# Patient Record
Sex: Female | Born: 1940 | Race: White | Hispanic: No | Marital: Married
Health system: Southern US, Community
[De-identification: ages and names within clinical notes are randomized; demographics above are authoritative.]

## PROBLEM LIST (undated history)

## (undated) DIAGNOSIS — J84112 Idiopathic pulmonary fibrosis: Secondary | ICD-10-CM

## (undated) DIAGNOSIS — I272 Pulmonary hypertension, unspecified: Secondary | ICD-10-CM

## (undated) DIAGNOSIS — J9691 Respiratory failure, unspecified with hypoxia: Secondary | ICD-10-CM

## (undated) DIAGNOSIS — IMO0002 Reserved for concepts with insufficient information to code with codable children: Secondary | ICD-10-CM

## (undated) DIAGNOSIS — J9611 Chronic respiratory failure with hypoxia: Secondary | ICD-10-CM

## (undated) DIAGNOSIS — J849 Interstitial pulmonary disease, unspecified: Secondary | ICD-10-CM

## (undated) HISTORY — DX: Interstitial pulmonary disease, unspecified: J84.9

## (undated) HISTORY — PX: TUBAL LIGATION: SHX77

## (undated) HISTORY — PX: APPENDECTOMY: SHX54

---

## 2007-03-27 ENCOUNTER — Emergency Department (HOSPITAL_COMMUNITY): Admission: EM | Admit: 2007-03-27 | Discharge: 2007-03-27 | Payer: Self-pay | Admitting: Emergency Medicine

## 2007-07-07 ENCOUNTER — Emergency Department (HOSPITAL_COMMUNITY): Admission: EM | Admit: 2007-07-07 | Discharge: 2007-07-07 | Payer: Self-pay | Admitting: Family Medicine

## 2012-11-04 ENCOUNTER — Ambulatory Visit
Admission: RE | Admit: 2012-11-04 | Discharge: 2012-11-04 | Disposition: A | Discharge status: Home / Self Care | Payer: Medicare Other | Source: Ambulatory Visit | Attending: Family | Admitting: Family

## 2012-11-04 ENCOUNTER — Other Ambulatory Visit: Payer: Self-pay | Admitting: Family

## 2012-11-04 DIAGNOSIS — R0989 Other specified symptoms and signs involving the circulatory and respiratory systems: Secondary | ICD-10-CM

## 2012-11-09 ENCOUNTER — Institutional Professional Consult (permissible substitution): Payer: Medicare Other | Admitting: Internal Medicine

## 2012-11-14 ENCOUNTER — Other Ambulatory Visit (INDEPENDENT_AMBULATORY_CARE_PROVIDER_SITE_OTHER): Payer: Medicare Other

## 2012-11-14 ENCOUNTER — Ambulatory Visit (INDEPENDENT_AMBULATORY_CARE_PROVIDER_SITE_OTHER): Payer: Medicare Other | Admitting: Internal Medicine

## 2012-11-14 ENCOUNTER — Encounter (INDEPENDENT_AMBULATORY_CARE_PROVIDER_SITE_OTHER): Payer: Self-pay

## 2012-11-14 ENCOUNTER — Encounter: Payer: Self-pay | Admitting: Internal Medicine

## 2012-11-14 VITALS — BP 132/84 | HR 83 | Temp 98.6°F | Ht 61.0 in | Wt 247.8 lb

## 2012-11-14 DIAGNOSIS — J8410 Pulmonary fibrosis, unspecified: Secondary | ICD-10-CM

## 2012-11-14 LAB — CBC WITH DIFFERENTIAL/PLATELET
Basophils Absolute: 0 10*3/uL (ref 0.0–0.1)
Eosinophils Absolute: 0.6 10*3/uL (ref 0.0–0.7)
HCT: 45.3 % (ref 36.0–46.0)
Hemoglobin: 15.5 g/dL — ABNORMAL HIGH (ref 12.0–15.0)
Lymphs Abs: 2.6 10*3/uL (ref 0.7–4.0)
MCHC: 34.2 g/dL (ref 30.0–36.0)
MCV: 91.6 fl (ref 78.0–100.0)
Monocytes Absolute: 0.6 10*3/uL (ref 0.1–1.0)
Neutro Abs: 5.1 10*3/uL (ref 1.4–7.7)
Platelets: 231 10*3/uL (ref 150.0–400.0)
RDW: 14 % (ref 11.5–14.6)

## 2012-11-14 LAB — BASIC METABOLIC PANEL
BUN: 13 mg/dL (ref 6–23)
Calcium: 9.4 mg/dL (ref 8.4–10.5)
Chloride: 100 mEq/L (ref 96–112)
Creatinine, Ser: 0.7 mg/dL (ref 0.4–1.2)
GFR: 90.87 mL/min (ref >60.00)

## 2012-11-14 MED ORDER — FAMOTIDINE 20 MG PO TABS: ORAL_TABLET | ORAL | Status: DC

## 2012-11-14 MED ORDER — PANTOPRAZOLE SODIUM 40 MG PO TBEC: 40.0000 mg | DELAYED_RELEASE_TABLET | Freq: Every day | ORAL | Status: DC

## 2012-11-14 MED ORDER — AZITHROMYCIN 250 MG PO TABS: ORAL_TABLET | ORAL | Status: DC

## 2012-11-14 NOTE — Progress Notes (Signed)
  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 ILD   11/14/2012 1st [facility] office visit/ Koby Pickup cc summer 2013 persistent cough and sob with exertion > adls.  Cough is dry and mostly daytime and assoc with constant sensation of pnds.  No obvious day to day or daytime variabilty or assoc  cp or chest tightness, subjective wheeze overt   hb symptoms. No unusual exp hx or h/o childhood pna/ asthma or knowledge of premature birth.  Sleeping ok without nocturnal  or early am exacerbation  of respiratory  c/o's or need for noct saba. Also denies any obvious fluctuation of symptoms with weather or environmental changes or other aggravating or alleviating factors except as outlined above   Current Medications, Allergies, Complete Past Medical History, Past Surgical History, Family History, and Social History were reviewed in [employer] record.     Review of Systems  Constitutional: Negative for fever and unexpected weight change.  HENT: Positive for postnasal drip and sneezing. Negative for congestion, dental problem, ear pain, nosebleeds, rhinorrhea, sinus pressure, sore throat and trouble swallowing.   Eyes: Negative for redness and itching.  Respiratory: Positive for cough and shortness of breath. Negative for chest tightness and wheezing.   Cardiovascular: Negative for palpitations and leg swelling.  Gastrointestinal: Negative for nausea and vomiting.  Genitourinary: Negative for dysuria.  Musculoskeletal: Negative for joint swelling.  Skin: Negative for rash.  Neurological: Negative for headaches.  Hematological: Does not bruise/bleed easily.  Psychiatric/Behavioral:  Negative for dysphoric mood. The patient is not nervous/anxious.        Objective:   Physical Exam   Hoarse amb obese wf nad  Wt Readings from Last 3 Encounters:  11/14/12 247 lb 12.8 oz (112.401 kg)     HEENT: nl dentition, turbinates, and orophanx. Nl external ear canals without cough reflex   NECK :  without JVD/Nodes/TM/ nl carotid upstrokes bilaterally   LUNGS: no acc muscle use, coarse bibasilar insp crackles   CV:  RRR  no s3 or murmur or increase in P2, no edema   ABD:  soft and nontender with nl excursion in the supine position. No bruits or organomegaly, bowel sounds nl  MS:  warm without deformities, calf tenderness, cyanosis - moderate clubbing  SKIN: warm and dry without lesions    NEURO:  alert, approp, no deficits    cxr 11/04/12  Suspect severe underlying chronic interstitial lung disease. There  are patchy bilateral airspace opacities. Cannot exclude superimposed  infection.   ESR 11/14/12 = 39 Eos 6.3%    Assessment & Plan:

## 2012-11-14 NOTE — Patient Instructions (Signed)
Pantoprazole (protonix) 40 mg   Take 30-60 min before first meal of the day and Pepcid 20 mg one bedtime until return to office - this is the best way to tell whether stomach acid is contributing to your problem.     GERD (REFLUX)  is an extremely common cause of respiratory symptoms, many times with no significant heartburn at all.    It can be treated with medication, but also with lifestyle changes including avoidance of late meals, excessive alcohol, smoking cessation, and avoid fatty foods, chocolate, peppermint, colas, red wine, and acidic juices such as orange juice.  NO MINT OR MENTHOL PRODUCTS SO NO COUGH DROPS  USE SUGARLESS CANDY INSTEAD (jolley ranchers or Stover's)  NO OIL BASED VITAMINS - use powdered substitutes.  Please see patient coordinator before you leave today  to schedule High resolution chest CT   Please remember to go to the lab and x-ray department downstairs for your tests - we will call you with the results when they are available.    Please schedule a follow up office visit in 4 weeks, call sooner if needed with pfts

## 2012-11-15 NOTE — Progress Notes (Signed)
Quick Note:  Spoke with pt and notified of results per Dr. Wert. Pt verbalized understanding and denied any questions.  ______ 

## 2012-11-15 NOTE — Assessment & Plan Note (Signed)
DDx for pulmonary fibrosis  includes idiopathic pulmonary fibrosis, pulmonary fibrosis associated with rheumatologic diseases (which have a relatively benign course in most cases) , adverse effect from  drugs such as chemotherapy or amiodarone exposure, nonspecific interstitial pneumonia which is typically steroid responsive, and chronic hypersensitivity pneumonitis.   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  Dec 2011  This may not be cause and effect, but given how universally unhelpful all the otherstudy drugs have been for pf,   rec start  rx ppi / diet/ lifestyle modification.    See instructions for specific recommendations which were reviewed directly with the patient who was given a copy with highlighter outlining the key components.

## 2012-11-17 ENCOUNTER — Other Ambulatory Visit: Payer: Self-pay

## 2012-11-17 ENCOUNTER — Ambulatory Visit (INDEPENDENT_AMBULATORY_CARE_PROVIDER_SITE_OTHER)
Admission: RE | Admit: 2012-11-17 | Discharge: 2012-11-17 | Disposition: A | Discharge status: Home / Self Care | Payer: Medicare Other | Source: Ambulatory Visit | Attending: Internal Medicine | Admitting: Internal Medicine

## 2012-11-17 DIAGNOSIS — J8410 Pulmonary fibrosis, unspecified: Secondary | ICD-10-CM

## 2012-11-18 ENCOUNTER — Encounter: Payer: Self-pay | Admitting: Internal Medicine

## 2012-11-18 NOTE — Progress Notes (Signed)
Quick Note:  Spoke with pt and notified of results per Dr. Wert. Pt verbalized understanding and denied any questions.  ______ 

## 2012-12-13 ENCOUNTER — Ambulatory Visit (INDEPENDENT_AMBULATORY_CARE_PROVIDER_SITE_OTHER): Payer: Medicare Other | Admitting: Internal Medicine

## 2012-12-13 ENCOUNTER — Encounter: Payer: Self-pay | Admitting: Internal Medicine

## 2012-12-13 VITALS — BP 124/76 | HR 72 | Temp 98.2°F | Ht 62.0 in | Wt 246.0 lb

## 2012-12-13 DIAGNOSIS — J8410 Pulmonary fibrosis, unspecified: Secondary | ICD-10-CM

## 2012-12-13 LAB — PULMONARY FUNCTION TEST

## 2012-12-13 NOTE — Assessment & Plan Note (Addendum)
-   onset of symptoms Summer 2013  - Labs 02/15/12 ESR 39, Eso 6.3% - HRCT 11/17/12 1. 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 Chronic HSP is not excluded. Pattern is notconsistent with usual interstitial pneumonitis (UIP). 2. Enlarged pulmonary arteries, indicative of PAH - PFT's 12/13/2012 VC 1.16 (42%) and no obst and DLCO 42% corrects to  107  - 12/13/2012  Walked RA  2 laps @ 185 ft each stopped due to desat 80% s sob at very rapid pace   Unable to identify any ongoing pulmonary insults/ exposures and given the fact the pt is not sob at all despite desat it is not possible to time the onset of her illness (the cough resolved with rx for GERD).  Explained need to get more points on the curve before considering any more aggressive w/u or rx except to continue rx for gerd.    Each maintenance medication was reviewed in detail including most importantly the difference between maintenance and as needed and under what circumstances the prns are to be used.  Please see instructions for details which were reviewed in writing and the patient given a copy.

## 2012-12-13 NOTE — Progress Notes (Signed)
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 ILD    History of Present Illness  11/14/2012 1st [facility] office visit/ Miranda Hines cc ? 2012 p cold more doe then   Acute onset cough summer 2013 p exposure to cleaning agents  persistent cough and sob with exertion > adls.  Cough is dry and mostly daytime and assoc with constant sensation of pnds. rec Pantoprazole (protonix) 40 mg   Take 30-60 min before first meal of the day and Pepcid 20 mg one bedtime until return to office - this is the best way to tell whether stomach acid is contributing to your problem.   GERD diet      12/13/2012 f/u ov/Miranda Hines re: PF/ cough > sob Chief Complaint  Patient presents with  . Followup with PFT    Pt states her cough and SOB are much improved since the last visit. No new co's today.   not limited by doe with shopping and housework, cough resolved to her satisfaction   No obvious day to day or daytime variabilty or assoc chronic cough or cp or chest tightness, subjective wheeze overt sinus or hb symptoms. No unusual exp hx or h/o childhood pna/ asthma or knowledge of premature birth.  Sleeping ok without nocturnal  or early am exacerbation  of respiratory  c/o's or need for noct saba. Also denies any obvious fluctuation of symptoms with weather or environmental changes or other aggravating or alleviating factors except as outlined above   Current Medications, Allergies, Complete Past Medical History, Past Surgical History, Family History, and Social History were reviewed in [employer] record.  ROS  The following are not active complaints unless bolded sore throat,  dysphagia, dental problems, itching, sneezing,  nasal congestion or excess/ purulent secretions, ear ache,   fever, chills, sweats, unintended wt loss, pleuritic or exertional cp, hemoptysis,  orthopnea pnd or leg swelling, presyncope, palpitations, heartburn, abdominal pain, anorexia, nausea, vomiting, diarrhea  or change in bowel or urinary habits, change in stools or urine, dysuria,hematuria,  rash, arthralgias, visual complaints, headache, numbness weakness or ataxia or problems with walking or coordination,  change in mood/affect or memory.                Objective:   Physical Exam   Hoarse amb obese wf nad  Wt Readings from Last 3 Encounters:  12/13/12 246 lb (111.585 kg)  11/14/12 247 lb 12.8 oz (112.401 kg)        HEENT: nl dentition, turbinates, and orophanx. Nl external ear canals without cough reflex   NECK :  without JVD/Nodes/TM/ nl carotid upstrokes bilaterally   LUNGS: no acc muscle use, coarse bibasilar insp crackles   CV:  RRR  no s3 or murmur or increase in P2, no edema   ABD:  soft and nontender with nl excursion in the supine position. No bruits or organomegaly, bowel sounds nl  MS:  warm without deformities, calf tenderness, cyanosis - mild/moderate clubbing  SKIN: warm and dry without lesions         cxr 11/04/12  Suspect severe underlying chronic interstitial lung disease. There  are patchy  bilateral airspace opacities. Cannot exclude superimposed  infection.   ESR 11/14/12 = 39 Eos 6.3%    Assessment & Plan:

## 2012-12-13 NOTE — Progress Notes (Signed)
PFT done today. 

## 2012-12-13 NOTE — Patient Instructions (Addendum)
Continue the acid suppression and diet as you are  Please schedule a follow up visit in 3 months but call sooner if needed with cxr on return

## 2012-12-14 ENCOUNTER — Encounter: Payer: Self-pay | Admitting: *Deleted

## 2012-12-23 ENCOUNTER — Telehealth: Payer: Self-pay | Admitting: Internal Medicine

## 2012-12-23 MED ORDER — AZITHROMYCIN 250 MG PO TABS: ORAL_TABLET | ORAL | Status: DC

## 2012-12-23 NOTE — Telephone Encounter (Signed)
Pt could not come for ov.  Zpak rx sent.  Pt instructed to get Chlortrimeton to replace Claritin and zyrtec.  Ov next week if no better.

## 2012-12-23 NOTE — Telephone Encounter (Signed)
1) prefer ov if possible  2) if not ok to rx zpak but change clariton and zyrtec to chlortrimeton 4 mg every 4 hours if needed (can cause drowsiness but more effective)    3) if not better ov early next week with me or Tammy NP

## 2012-12-23 NOTE — Telephone Encounter (Signed)
Pt c/o sinus drainage (clear), runny eyes, PND, nasal congestion, sneezing for past 2 days.  Denies cough or SOB.  Taking Claritin in am, Zyrtec in Pm and Flonase.  Pt is requesting another course of Zpak.  Please advise

## 2012-12-26 ENCOUNTER — Encounter: Payer: Self-pay | Admitting: Internal Medicine

## 2013-01-10 ENCOUNTER — Telehealth: Payer: Self-pay | Admitting: Internal Medicine

## 2013-01-10 ENCOUNTER — Ambulatory Visit: Payer: Medicare Other | Admitting: Internal Medicine

## 2013-01-10 NOTE — Telephone Encounter (Signed)
Spoke with pt. She scheduled an appt to come in tomorrow at 10:15 to see MW. Nothing further needed

## 2013-01-11 ENCOUNTER — Ambulatory Visit (INDEPENDENT_AMBULATORY_CARE_PROVIDER_SITE_OTHER): Payer: Medicare Other | Admitting: Internal Medicine

## 2013-01-11 ENCOUNTER — Encounter: Payer: Self-pay | Admitting: Internal Medicine

## 2013-01-11 VITALS — BP 110/70 | HR 76 | Temp 98.4°F | Ht 62.0 in | Wt 238.4 lb

## 2013-01-11 DIAGNOSIS — J069 Acute upper respiratory infection, unspecified: Secondary | ICD-10-CM | POA: Insufficient documentation

## 2013-01-11 DIAGNOSIS — J8410 Pulmonary fibrosis, unspecified: Secondary | ICD-10-CM

## 2013-01-11 MED ORDER — AMOXICILLIN-POT CLAVULANATE 875-125 MG PO TABS: 1.0000 | ORAL_TABLET | Freq: Two times a day (BID) | ORAL | Status: DC

## 2013-01-11 MED ORDER — PREDNISONE 10 MG PO TABS: ORAL_TABLET | ORAL | Status: DC

## 2013-01-11 NOTE — Assessment & Plan Note (Signed)
-   onset of symptoms Summer 2013  - Labs 02/15/12 ESR 39, Eso 6.3% - HRCT 11/17/12 1. Pulmonary parenchymal pattern of interstitial lung disease may be due to end-stage sarcoid or postinflammatory fibrosis. Chronic HSP is not excluded. Pattern is not consistent with usual interstitial pneumonitis (UIP). 2. Enlarged pulmonary arteries, indicative of pulmonary arterial Hypertension. - PFT's 12/13/2012 VC 1.16 (42%) and no obst and DLCO 42% corrects to  107   No evidence of acute exac of PF or pna > keep f/u appt to complete the w/u

## 2013-01-11 NOTE — Patient Instructions (Addendum)
Augmentin 875 mg take one pill twice daily  X 10 days - take at breakfast and supper with large glass of water.  It would help reduce the usual side effects (diarrhea and yeast infections) if you ate cultured yogurt at lunch.  Prednisone 10 mg take  4 each am x 2 days,   2 each am x 2 days,  1 each am x 2 days and stop    I emphasized that nasal steroids have no immediate benefit in terms of improving symptoms.  To help them reached the target tissue, the patient should use Afrin 12h two puffs every 12 hours applied one min before using the nasal steroids.  Afrin should be stopped after no more than 5 days.  If the symptoms worsen, Afrin can be restarted after 5 days off of therapy to prevent rebound congestion from overuse of Afrin.  I also emphasized that in no way are nasal steroids a concern in terms of "addiction".   If you 100% satisfied keep appt for March

## 2013-01-11 NOTE — Assessment & Plan Note (Signed)
Assoc with rhinitis ? Sinusitis in pt on chronic nasal steroids  I emphasized that nasal steroids have no immediate benefit in terms of improving symptoms.  To help them reached the target tissue, the patient should use Afrin two puffs every 12 hours applied one min before using the nasal steroids.  Afrin should be stopped after no more than 5 days.  If the symptoms worsen, Afrin can be restarted after 5 days off of therapy to prevent rebound congestion from overuse of Afrin.  I also emphasized that in no way are nasal steroids a concern in terms of "addiction".    rec augmentin x 10 days/ short course prednisone     Each maintenance medication was reviewed in detail including most importantly the difference between maintenance and as needed and under what circumstances the prns are to be used.  Please see instructions for details which were reviewed in writing and the patient given a copy.

## 2013-01-11 NOTE — Progress Notes (Signed)
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 ILD    History of Present Illness  11/14/2012 1st [facility] office visit/ Andrell Tallman cc ? 2012 p cold more doe then   Acute onset cough summer 2013 p exposure to cleaning agents  persistent cough and sob with exertion > adls.  Cough is dry and mostly daytime and assoc with constant sensation of pnds. rec Pantoprazole (protonix) 40 mg   Take 30-60 min before first meal of the day and Pepcid 20 mg one bedtime until return to office - this is the best way to tell whether stomach acid is contributing to your problem.   GERD diet      12/13/2012 f/u ov/Hancel Ion re: PF/ cough > sob Chief Complaint  Patient presents with  . Followup with PFT    Pt states her cough and SOB are much improved since the last visit. No new co's today.   not limited by doe with shopping and housework, cough resolved to her satisfaction  rec Continue the acid suppression and diet as you are  01/11/2013 f/u ov/Elany Felix re: URI  Chief Complaint  Patient presents with  . Acute Visit    Pt c/o increased SOB and prod cough with minimal tan sputum for the past 3 days. She has also had chills but unsure of fever.   says now not really feeling good x one year but acutely worse x 3 days, assoc with sore throat.  No obvious day to day or daytime variabilty or assoc   cp or chest tightness, subjective wheeze overt sinus or hb symptoms. No unusual exp hx or h/o childhood pna/ asthma or knowledge of premature birth.  Sleeping ok without nocturnal  or early am exacerbation  of respiratory  c/o's or need for noct saba. Also denies any obvious fluctuation of symptoms with  weather or environmental changes or other aggravating or alleviating factors except as outlined above   Current Medications, Allergies, Complete Past Medical History, Past Surgical History, Family History, and Social History were reviewed in [employer] record.  ROS  The following are not active complaints unless bolded sore throat, dysphagia, dental problems, itching, sneezing,  nasal congestion or excess/ purulent secretions, ear ache,   fever, chills, sweats, unintended wt loss, pleuritic or exertional cp, hemoptysis,  orthopnea pnd or leg swelling, presyncope, palpitations, heartburn, abdominal pain, anorexia, nausea, vomiting, diarrhea  or change in bowel or urinary habits, change in stools or urine, dysuria,hematuria,  rash, arthralgias, visual complaints, headache, numbness weakness or ataxia or problems with walking or coordination,  change in mood/affect or memory.                Objective:   Physical Exam   Hoarse amb obese wf nad  Wt Readings from Last 3 Encounters:  01/11/13 238 lb 6.4 oz (108.138 kg)  12/13/12 246 lb (111.585 kg)  11/14/12 247 lb 12.8 oz (112.401 kg)           HEENT: nl dentition, turbinates, and orophanx. Nl external ear canals without cough reflex   NECK :  without JVD/Nodes/TM/ nl carotid upstrokes bilaterally   LUNGS:  no acc muscle use, coarse bibasilar insp crackles   CV:  RRR  no s3 or murmur or increase in P2, no edema   ABD:  soft and nontender with nl excursion in the supine position. No bruits or organomegaly, bowel sounds nl  MS:  warm without deformities, calf tenderness, cyanosis - mild/moderate clubbing  SKIN: warm and dry without lesions         cxr 11/04/12  Suspect severe underlying chronic interstitial lung disease. There  are patchy bilateral airspace opacities. Cannot exclude superimposed  infection.   ESR 11/14/12 = 39 Eos 6.3%    Assessment & Plan:

## 2013-02-08 ENCOUNTER — Other Ambulatory Visit: Payer: Self-pay | Admitting: Internal Medicine

## 2013-04-03 ENCOUNTER — Ambulatory Visit (INDEPENDENT_AMBULATORY_CARE_PROVIDER_SITE_OTHER): Payer: Medicare Other | Admitting: Internal Medicine

## 2013-04-03 ENCOUNTER — Encounter: Payer: Self-pay | Admitting: Internal Medicine

## 2013-04-03 VITALS — BP 112/80 | HR 77 | Temp 98.2°F | Ht 62.0 in | Wt 234.4 lb

## 2013-04-03 DIAGNOSIS — J8410 Pulmonary fibrosis, unspecified: Secondary | ICD-10-CM

## 2013-04-03 NOTE — Assessment & Plan Note (Addendum)
-  onset of symptoms Summer 2013  - Labs 02/15/12 ESR 39, Eso 6.3% - HRCT 11/17/12 1. 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 Chronic HSP is not excluded. Pattern is not consistent with usual interstitial pneumonitis (UIP). 2. Enlarged pulmonary arteries, indicative of pulmonary arterial Hypertension. - PFT's 12/13/2012 VC 1.16 (42%) and no obst and DLCO 42% corrects to  107  - 12/13/2012  Walked RA  2 laps @ 185 ft each stopped due to desat 80% s sob at very rapid pace - 04/03/2013   Walked RA x one lap @ 185 stopped due to desat 81%   Still not clear she has anything more than post ALI pulmonary fibrosis as suggested by HRCT and she's actually feeling completely back to baseline so Discussed in detail all the  indications, usual  risks and alternatives  relative to the benefits with patient who agrees to proceed with conservative rx and continue to put multiple points on the curve

## 2013-04-03 NOTE — Progress Notes (Signed)
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 ILD    History of Present Illness  11/14/2012 1st [facility] office visit/ Miranda Hines cc ? 2012 p cold more doe then   Acute onset cough summer 2013 p exposure to cleaning agents  persistent cough and sob with exertion > adls.  Cough is dry and mostly daytime and assoc with constant sensation of pnds. rec Pantoprazole (protonix) 40 mg   Take 30-60 min before first meal of the day and Pepcid 20 mg one bedtime until return to office - this is the best way to tell whether stomach acid is contributing to your problem.   GERD diet      12/13/2012 f/u ov/Miranda Hines re: PF/ cough > sob Chief Complaint  Patient presents with  . Followup with PFT    Pt states her cough and SOB are much improved since the last visit. No new co's today.   not limited by doe with shopping and housework, cough resolved to her satisfaction  rec Continue the acid suppression and diet as you are  01/11/2013 f/u ov/Miranda Hines re: URI  Chief Complaint  Patient presents with  . Acute Visit    Pt c/o increased SOB and prod cough with minimal tan sputum for the past 3 days. She has also had chills but unsure of fever.   says now not really feeling good x one year but acutely worse x 3 days, assoc with sore throat. rec Augmentin 875 mg take one pill twice daily  X 10 days - . Prednisone 10 mg take  4 each am x 2 days,   2 each am x 2 days,  1 each am x 2 days and stop  Nasal steroid flyer info   04/03/2013 f/u ov/Miranda Hines re:  ?ILD  Chief Complaint  Patient presents with  . Follow-up    Pt states her SOB and cough are much improved since last visit. No new co's today.       No obvious day to day or daytime variabilty or assoc   cp or chest tightness, subjective wheeze overt sinus or hb symptoms. No unusual exp hx or h/o childhood pna/ asthma or knowledge of premature birth.  Sleeping ok without nocturnal  or early am exacerbation  of respiratory  c/o's or need for noct saba. Also denies any obvious fluctuation of symptoms with weather or environmental changes or other aggravating or alleviating factors except as outlined above   Current Medications, Allergies, Complete Past Medical History, Past Surgical History, Family History, and Social History were reviewed in [employer] record.  ROS  The following are not active complaints unless bolded sore throat, dysphagia, dental problems, itching, sneezing,  nasal congestion or excess/ purulent secretions, ear ache,   fever, chills, sweats, unintended wt loss, pleuritic or exertional cp, hemoptysis,  orthopnea pnd or leg swelling, presyncope, palpitations, heartburn, abdominal pain, anorexia, nausea, vomiting, diarrhea  or change in bowel or urinary habits, change in stools or urine, dysuria,hematuria,  rash, arthralgias, visual complaints, headache, numbness weakness or ataxia or problems with walking or coordination,  change in mood/affect or memory.  Objective:   Physical Exam   Hoarse amb obese wf nad  04/03/2013        234  Wt Readings from Last 3 Encounters:  01/11/13 238 lb 6.4 oz (108.138 kg)  12/13/12 246 lb (111.585 kg)  11/14/12 247 lb 12.8 oz (112.401 kg)           HEENT: nl dentition, turbinates, and orophanx. Nl external ear canals without cough reflex   NECK :  without JVD/Nodes/TM/ nl carotid upstrokes bilaterally   LUNGS: no acc muscle use, coarse bibasilar insp crackles   CV:  RRR  no s3 or murmur or increase in P2, no edema   ABD:  soft and nontender with nl excursion in the supine position. No bruits or organomegaly, bowel sounds nl  MS:   warm without deformities, calf tenderness, cyanosis - mild/moderate clubbing  SKIN: warm and dry without lesions         cxr 11/04/12  Suspect severe underlying chronic interstitial lung disease. There  are patchy bilateral airspace opacities. Cannot exclude superimposed  infection.   ESR 11/14/12 = 39 Eos 6.3%    Assessment & Plan:

## 2013-04-03 NOTE — Patient Instructions (Addendum)
Ok to zyrtec 10 mg one daily as needed for nasal drainage   Return 1st week in [place] 2015 for pft and cxr then

## 2013-05-19 ENCOUNTER — Emergency Department (HOSPITAL_COMMUNITY): Payer: Medicare Other

## 2013-05-19 ENCOUNTER — Encounter (HOSPITAL_COMMUNITY): Payer: Self-pay | Admitting: Emergency Medicine

## 2013-05-19 ENCOUNTER — Emergency Department (HOSPITAL_COMMUNITY)
Admission: EM | Admit: 2013-05-19 | Discharge: 2013-05-19 | Disposition: A | Discharge status: Home / Self Care | Payer: Medicare Other | Attending: Emergency Medicine | Admitting: Emergency Medicine

## 2013-05-19 DIAGNOSIS — Z79899 Other long term (current) drug therapy: Secondary | ICD-10-CM | POA: Insufficient documentation

## 2013-05-19 DIAGNOSIS — Z8709 Personal history of other diseases of the respiratory system: Secondary | ICD-10-CM | POA: Insufficient documentation

## 2013-05-19 DIAGNOSIS — R799 Abnormal finding of blood chemistry, unspecified: Secondary | ICD-10-CM

## 2013-05-19 DIAGNOSIS — R7989 Other specified abnormal findings of blood chemistry: Secondary | ICD-10-CM | POA: Diagnosis present

## 2013-05-19 DIAGNOSIS — R002 Palpitations: Secondary | ICD-10-CM | POA: Insufficient documentation

## 2013-05-19 DIAGNOSIS — Z7982 Long term (current) use of aspirin: Secondary | ICD-10-CM | POA: Insufficient documentation

## 2013-05-19 DIAGNOSIS — IMO0002 Reserved for concepts with insufficient information to code with codable children: Secondary | ICD-10-CM | POA: Insufficient documentation

## 2013-05-19 HISTORY — DX: Idiopathic pulmonary fibrosis: J84.112

## 2013-05-19 LAB — BASIC METABOLIC PANEL
BUN: 13 mg/dL (ref 6–23)
CALCIUM: 9.1 mg/dL (ref 8.4–10.5)
CO2: 26 meq/L (ref 19–32)
CREATININE: 0.6 mg/dL (ref 0.50–1.10)
Chloride: 101 mEq/L (ref 96–112)
GFR calc non Af Amer: >90 mL/min (ref >90)
Glucose, Bld: 102 mg/dL — ABNORMAL HIGH (ref 70–99)
Potassium: 4.4 mEq/L (ref 3.7–5.3)
SODIUM: 140 meq/L (ref 137–147)

## 2013-05-19 LAB — CBC
HCT: 44.9 % (ref 36.0–46.0)
Hemoglobin: 14.9 g/dL (ref 12.0–15.0)
MCH: 31.7 pg (ref 26.0–34.0)
MCHC: 33.2 g/dL (ref 30.0–36.0)
MCV: 95.5 fL (ref 78.0–100.0)
PLATELETS: 202 10*3/uL (ref 150–400)
RBC: 4.7 MIL/uL (ref 3.87–5.11)
RDW: 13.5 % (ref 11.5–15.5)
WBC: 6.7 10*3/uL (ref 4.0–10.5)

## 2013-05-19 LAB — I-STAT TROPONIN, ED: TROPONIN I, POC: 0 ng/mL (ref 0.00–0.08)

## 2013-05-19 LAB — PRO B NATRIURETIC PEPTIDE: PRO B NATRI PEPTIDE: 995.7 pg/mL — AB (ref 0–125)

## 2013-05-19 NOTE — Consult Note (Signed)
Admit date: 05/19/2013 Referring Physician  Dr. Ethelda ChickJacubowitz Primary Physician  Dr. Leona Singletonammy Fulp Primary Cardiologist  None Reason for Consultation  Palpitations  HPI: This is a 73yo WF with no prior cardiac history who only has a PMH of pulmonary fibrosis by chest xray after having the flu.  She presents to the ER today after having about 2 hours of palpitations.  She says that last night while sitting in a swing she had sudden onset of flip flopping and quivering of her heart.  She denied any chest pain, SOB, dizziness or syncope.  She took her O2 sat which was 93% and normal for her.  It lasted a few hours and then stopped on its own.  Her HR was as high as 151bpm.  Since then she has not had any further palpitations.  She was seen by her PCP today and sent to [facility].  She has a chronic cough.  Since her diagnosis of ILD her activity level has decreased some.  She occasionally has some LE edema which is chronic.   PMH:   Past Medical History  Diagnosis Date  . Interstitial lung disease   . IPF (idiopathic pulmonary fibrosis)      PSH:   Past Surgical History  Procedure Laterality Date  . Appendectomy    . Tubal ligation      Allergies:  Penicillins Prior to Admit Meds:   (Not in a hospital admission) Fam HX:    Family History  Problem Relation Age of Onset  . Kidney failure Mother   . COPD Mother   . COPD Brother    Social HX:    History   Social History  . Marital Status: Married    Spouse Name: N/A    Number of Children: N/A  . Years of Education: N/A   Occupational History  . Not on file.   Social History Main Topics  . Smoking status: Passive Smoke Exposure - Never Smoker  . Smokeless tobacco: Not on file  . Alcohol Use: No  . Drug Use: No  . Sexual Activity: Not on file   Other Topics Concern  . Not on file   Social History Narrative  . No narrative on file     ROS:  All 11 ROS were addressed and are negative except what is stated in the HPI  Physical  Exam: Blood pressure 119/58, pulse 67, temperature 98.4 F (36.9 C), resp. rate 35, SpO2 88.00%.    General: Well developed, well nourished, in no acute distress Head: Eyes PERRLA, No xanthomas.   Normal cephalic and atramatic  Lungs:diffuse fine crackles of pulmonary fibrosis Heart:   HRRR S1 S2 Pulses are 2+ & equal.            No carotid bruit. No JVD.  No abdominal bruits. No femoral bruits. Abdomen: Bowel sounds are positive, abdomen soft and non-tender without masses  Extremities:   No clubbing, cyanosis or edema.  DP +1 Neuro: Alert and oriented X 3. Psych:  Good affect, responds appropriately    Labs:   Lab Results  Component Value Date   WBC 6.7 05/19/2013   HGB 14.9 05/19/2013   HCT 44.9 05/19/2013   MCV 95.5 05/19/2013   PLT 202 05/19/2013     Recent Labs Lab 05/19/13 1351  NA 140  K 4.4  CL 101  CO2 26  BUN 13  CREATININE 0.60  CALCIUM 9.1  GLUCOSE 102*     Radiology:  Dg Chest 2  View  05/19/2013   CLINICAL DATA:  Tachycardia.  Atrial fibrillation.  EXAM: CHEST  2 VIEW  COMPARISON:  11/04/2012  FINDINGS: There is a background pattern of extensive chronic fibrotic disease of the lungs. The heart is enlarged. I do not see any identifiable acute infiltrate or edema. Early infiltrate or mild edema could be hidden amongst the extensive fibrotic changes. No effusions. No collapse.  IMPRESSION: Extensive chronic fibrotic lung disease. No sign of pneumonia or edema. However, early pneumonia or edema could be hidden amongst these extensive chronic changes.   Electronically Signed   By: Paulina FusiMark  Shogry M.D.   On: 05/19/2013 15:09    EKG:  NSR with RAE and rSR"  ASSESSMENT:  1.  Palpitations for 2 hours duration that resolved last night.  This is new for her.  EKG today shows NSR with no ST changes.   She had no other symptoms of chest pain, SOB or dizziness.  She drinks 1 cup of caffeine daily. 2.  Elevated BNP with no evidence of CHF on exam and no SOB or LE.  This is probably  chronically elevated due to her underlying lung disease.  No CHF noted on chest xray 3.  Pulmonary fibrosis  PLAN:   1.  OK from cardiac standpoint go d/c home 2.  Will set patient up for 2D echo to assess LVF in office 3.  Outpt event monitor that we will arrange for her to come to office on Monday to get 4.  Followup with me in 2 weeks in the office  Quintella Reichertraci R Carrson Lightcap, MD  05/19/2013  4:56 PM

## 2013-05-19 NOTE — ED Notes (Signed)
Pt reports her normal SpO2 is 87-89% on RA.  Does not use O2 at home.

## 2013-05-19 NOTE — Discharge Instructions (Signed)
Cardiac Event Monitoring Dr. Norris Crossurner's office will call you to have an event monitor placed on 05/22/2013. If you don't hear from the office by the afternoon, call to schedule an panel office staff that Dr. Mayford Knifeurner saw you in the emergency department. If you develop an abnormal heartbeat before getting your monitor placed, come to the emergency department immediately A cardiac event monitor is a small recording device used to help detect abnormal heart rhythms (arrhythmias). The monitor is used to record heart rhythm when noticeable symptoms such as the following occur:  Fast heart beats (palpitations), such as heart racing or fluttering.  Dizziness.  Fainting or lightheadedness.  Unexplained weakness. The monitor is wired to two electrodes placed on your chest. Electrodes are flat, sticky disks that attach to your skin. The monitor can be worn for up to 30 days. You will wear the monitor at all times, except when bathing.  HOW TO USE YOUR CARDIAC EVENT MONITOR A technician will prepare your chest for the electrode placement. The technician will show you how to place the electrodes, how to work the monitor, and how to replace the batteries. Take time to practice using the monitor before you leave the office. Make sure you understand how to send the information from the monitor to your health care provider. This requires a telephone with a landline, not a cellphone. You need to:  Wear your monitor at all times, except when you are in water:  Do not get the monitor wet.  Take the monitor off when bathing. Do not swim or use a hot tub with it on.  Keep your skin clean. Do not put body lotion or moisturizer on your chest.  Change the electrodes daily or any time they stop sticking to your skin. You might need to use tape to keep them on.  It is possible that your skin under the electrodes could become irritated. To keep this from happening, try to put the electrodes in slightly different places on  your chest. However, they must remain in the area under your left breast and in the upper right section of your chest.  Make sure the monitor is safely clipped to your clothing or in a location close to your body that your health care provider recommends.  Press the button to record when you feel symptoms of heart trouble, such as dizziness, weakness, lightheadedness, palpitations, thumping, shortness of breath, unexplained weakness, or a fluttering or racing heart. The monitor is always on and records what happened slightly before you pressed the button, so do not worry about being too late to get good information.  Keep a diary of your activities, such as walking, doing chores, and taking medicine. It is especially important to note what you were doing when you pushed the button to record your symptoms. This will help your health care provider determine what might be contributing to your symptoms. The information stored in your monitor will be reviewed by your health care provider alongside your diary entries.  Send the recorded information as recommended by your health care provider. It is important to understand that it will take some time for your health care provider to process the results.  Change the batteries as recommended by your health care provider. SEEK IMMEDIATE MEDICAL CARE IF:   You have chest pain.  You have extreme difficulty breathing or shortness of breath.  You develop a very fast heartbeat that persists.  You develop dizziness that does not go away .  You faint  or constantly feel you are about to faint. Document Released: 10/08/2007 Document Revised: 08/31/2012 Document Reviewed: 06/27/2012 [facility] Patient Information 2014 [employer] [place].

## 2013-05-19 NOTE — ED Notes (Signed)
Per pt she had about a 3 hour episode of her heart rate ranging from 70 to 150 yesterday. Denies hx of the same. Denies chest pain or SOB.

## 2013-05-19 NOTE — ED Provider Notes (Addendum)
CSN: [id number]     Arrival date & time 05/19/13  1336 History   First MD Initiated Contact with Patient 05/19/13 1528     Chief Complaint  Patient presents with  . Tachycardia  . Atrial Fibrillation     (Consider location/radiation/quality/duration/timing/severity/associated sxs/prior Treatment) HPI Patient developed rapid heartbeat last night while sitting on her porch 9 PM. No chest pain no lightheadedness or shortness of breath no other associated symptoms symptoms resolve spontaneously after 2 hours. She is presently asymptomatic. No treatment prior to coming here. Past Medical History  Diagnosis Date  . Interstitial lung disease    History reviewed. No pertinent past surgical history. History reviewed. No pertinent family history. History  Substance Use Topics  . Smoking status: Passive Smoke Exposure - Never Smoker  . Smokeless tobacco: Not on file  . Alcohol Use: No   no tobacco no alcohol no drug OB History   Grav Para Term Preterm Abortions TAB SAB Ect Mult Living                 Review of Systems  Constitutional: Negative.   HENT: Negative.   Respiratory: Negative.   Cardiovascular: Positive for palpitations.  Gastrointestinal: Negative.   Musculoskeletal: Negative.   Skin: Negative.   Neurological: Negative.   Psychiatric/Behavioral: Negative.   All other systems reviewed and are negative.     Allergies  Review of patient's allergies indicates no known allergies.  Home Medications   Prior to Admission medications   Medication Sig Start Date End Date Taking? Authorizing Provider  aspirin 81 MG tablet Take 81 mg by mouth daily.    Historical Provider, MD  chlorpheniramine (CHLOR-TRIMETON) 4 MG tablet Take 4 mg by mouth every 6 (six) hours as needed for allergies.    Historical Provider, MD  famotidine (PEPCID) 20 MG tablet take 1 tablet by mouth at bedtime 02/08/13   Nyoka Cowden, MD  fluticasone [facility]) 50 MCG/ACT nasal spray 2 sprays daily.  11/02/12   Historical Provider, MD  guaiFENesin (MUCINEX) 600 MG 12 hr tablet Take 1,200 mg by mouth as needed for congestion.    Historical Provider, MD  pantoprazole (PROTONIX) 40 MG tablet TAKE 1 TABLET BY MOUTH DAILY 30 TO 60 MINUTES BEFORE FIRST MEAL OF THE DAY 02/08/13   Nyoka Cowden, MD  Saline (SIMPLY SALINE) 0.9 % AERS Place 1 spray into the nose daily as needed.    Historical Provider, MD   BP 122/70  Pulse 85  Temp(Src) 98.4 F (36.9 C)  Resp 18  SpO2 90% Physical Exam  Nursing note and vitals reviewed. Constitutional: She appears well-developed and well-nourished.  HENT:  Head: Normocephalic and atraumatic.  Eyes: Conjunctivae are normal. Pupils are equal, round, and reactive to light.  Neck: Neck supple. No tracheal deviation present. No thyromegaly present.  Cardiovascular: Normal rate and regular rhythm.   No murmur heard. Pulmonary/Chest: Effort normal and breath sounds normal.  Abdominal: Soft. Bowel sounds are normal. She exhibits no distension. There is no tenderness.  Musculoskeletal: Normal range of motion. She exhibits no edema and no tenderness.  Neurological: She is alert. Coordination normal.  Skin: Skin is warm and dry. No rash noted.  Psychiatric: She has a normal mood and affect.    ED Course  Procedures (including critical care time) Labs Review Labs Reviewed  BASIC METABOLIC PANEL - Abnormal; Notable for the following:    Glucose, Bld 102 (*)    All other components within normal limits  PRO B  NATRIURETIC PEPTIDE - Abnormal; Notable for the following:    Pro B Natriuretic peptide (BNP) 995.7 (*)    All other components within normal limits  CBC  I-STAT TROPOININ, ED    Imaging Review Dg Chest 2 View  05/19/2013   CLINICAL DATA:  Tachycardia.  Atrial fibrillation.  EXAM: CHEST  2 VIEW  COMPARISON:  11/04/2012  FINDINGS: There is a background pattern of extensive chronic fibrotic disease of the lungs. The heart is enlarged. I do not see any  identifiable acute infiltrate or edema. Early infiltrate or mild edema could be hidden amongst the extensive fibrotic changes. No effusions. No collapse.  IMPRESSION: Extensive chronic fibrotic lung disease. No sign of pneumonia or edema. However, early pneumonia or edema could be hidden amongst these extensive chronic changes.   Electronically Signed   By: Paulina FusiMark  Shogry M.D.   On: 05/19/2013 15:09     EKG Interpretation None     Chest x-ray viewed by me. Results for orders placed during the hospital encounter of 05/19/13  CBC      Result Value Ref Range   WBC 6.7  4.0 - 10.5 K/uL   RBC 4.70  3.87 - 5.11 MIL/uL   Hemoglobin 14.9  12.0 - 15.0 g/dL   HCT 16.144.9  09.636.0 - 04.546.0 %   MCV 95.5  78.0 - 100.0 fL   MCH 31.7  26.0 - 34.0 pg   MCHC 33.2  30.0 - 36.0 g/dL   RDW 40.913.5  81.111.5 - 91.415.5 %   Platelets 202  150 - 400 K/uL  BASIC METABOLIC PANEL      Result Value Ref Range   Sodium 140  137 - 147 mEq/L   Potassium 4.4  3.7 - 5.3 mEq/L   Chloride 101  96 - 112 mEq/L   CO2 26  19 - 32 mEq/L   Glucose, Bld 102 (*) 70 - 99 mg/dL   BUN 13  6 - 23 mg/dL   Creatinine, Ser 7.820.60  0.50 - 1.10 mg/dL   Calcium 9.1  8.4 - 95.610.5 mg/dL   GFR calc non Af Amer >90  >90 mL/min   GFR calc Af Amer >90  >90 mL/min  PRO B NATRIURETIC PEPTIDE      Result Value Ref Range   Pro B Natriuretic peptide (BNP) 995.7 (*) 0 - 125 pg/mL  I-STAT TROPOININ, ED      Result Value Ref Range   Troponin i, poc 0.00  0.00 - 0.08 ng/mL   Comment 3            Dg Chest 2 View  05/19/2013   CLINICAL DATA:  Tachycardia.  Atrial fibrillation.  EXAM: CHEST  2 VIEW  COMPARISON:  11/04/2012  FINDINGS: There is a background pattern of extensive chronic fibrotic disease of the lungs. The heart is enlarged. I do not see any identifiable acute infiltrate or edema. Early infiltrate or mild edema could be hidden amongst the extensive fibrotic changes. No effusions. No collapse.  IMPRESSION: Extensive chronic fibrotic lung disease. No sign of  pneumonia or edema. However, early pneumonia or edema could be hidden amongst these extensive chronic changes.   Electronically Signed   By: Paulina FusiMark  Shogry M.D.   On: 05/19/2013 15:09    MDM  She reports that pulse ox typically runs 86-90%. She's not short breath presently. BMP does not correlate clinically. Patient lies flat without dyspnea. Cardiology consult in ED. Final diagnoses:  None   Dr. Mayford Knifeurner evaluate patient  in ED and feels elevated BNP likely secondary pulmonary fibrosis. I agree that elevated BNP does not correlate clinically to congestive heart failure. Patient denies dyspnea. plan : arrange to have a event monitor placed by cardiology office Dx palpitations     Doug SouSam Javaria Knapke, MD 05/19/13 1629  Doug SouSam Christophor Eick, MD 05/19/13 [id number]

## 2013-05-22 ENCOUNTER — Other Ambulatory Visit: Payer: Self-pay | Admitting: General Surgery

## 2013-05-22 ENCOUNTER — Telehealth: Payer: Self-pay | Admitting: Cardiology

## 2013-05-22 DIAGNOSIS — R002 Palpitations: Secondary | ICD-10-CM

## 2013-05-22 NOTE — Telephone Encounter (Signed)
error 

## 2013-05-23 ENCOUNTER — Other Ambulatory Visit: Payer: Self-pay | Admitting: Internal Medicine

## 2013-05-23 ENCOUNTER — Encounter (INDEPENDENT_AMBULATORY_CARE_PROVIDER_SITE_OTHER): Payer: Medicare Other

## 2013-05-23 ENCOUNTER — Encounter: Payer: Self-pay | Admitting: *Deleted

## 2013-05-23 DIAGNOSIS — R002 Palpitations: Secondary | ICD-10-CM

## 2013-05-23 NOTE — Progress Notes (Signed)
Patient ID: Miranda Hines, female   DOB: 09-Feb-1940, 73 y.o.   MRN: [id number] [employer] 30 day cardiac event monitor applied to patient.

## 2013-05-25 ENCOUNTER — Other Ambulatory Visit: Payer: Self-pay | Admitting: Internal Medicine

## 2013-05-25 MED ORDER — PANTOPRAZOLE SODIUM 40 MG PO TBEC: DELAYED_RELEASE_TABLET | ORAL | Status: DC

## 2013-05-25 MED ORDER — FAMOTIDINE 20 MG PO TABS: 20.0000 mg | ORAL_TABLET | Freq: Every day | ORAL | Status: DC

## 2013-06-12 ENCOUNTER — Ambulatory Visit: Payer: Medicare Other | Admitting: Cardiology

## 2013-06-30 ENCOUNTER — Telehealth: Payer: Self-pay | Admitting: Cardiology

## 2013-06-30 NOTE — Telephone Encounter (Signed)
Please let patient know that heart monitor showed NSR 

## 2013-07-03 NOTE — Telephone Encounter (Signed)
Pt is aware of results. 

## 2013-08-01 ENCOUNTER — Ambulatory Visit: Payer: Medicare Other | Admitting: Cardiology

## 2013-10-06 ENCOUNTER — Telehealth: Payer: Self-pay | Admitting: Internal Medicine

## 2013-10-06 ENCOUNTER — Ambulatory Visit (INDEPENDENT_AMBULATORY_CARE_PROVIDER_SITE_OTHER)
Admission: RE | Admit: 2013-10-06 | Discharge: 2013-10-06 | Disposition: A | Discharge status: Home / Self Care | Payer: Medicare Other | Source: Ambulatory Visit | Attending: Internal Medicine | Admitting: Internal Medicine

## 2013-10-06 ENCOUNTER — Ambulatory Visit (INDEPENDENT_AMBULATORY_CARE_PROVIDER_SITE_OTHER): Payer: Medicare Other | Admitting: Internal Medicine

## 2013-10-06 ENCOUNTER — Encounter: Payer: Self-pay | Admitting: Internal Medicine

## 2013-10-06 VITALS — BP 128/66 | HR 74 | Temp 98.7°F | Ht 62.0 in | Wt 231.0 lb

## 2013-10-06 DIAGNOSIS — J069 Acute upper respiratory infection, unspecified: Secondary | ICD-10-CM

## 2013-10-06 DIAGNOSIS — Z23 Encounter for immunization: Secondary | ICD-10-CM

## 2013-10-06 DIAGNOSIS — R0902 Hypoxemia: Secondary | ICD-10-CM

## 2013-10-06 DIAGNOSIS — J8410 Pulmonary fibrosis, unspecified: Secondary | ICD-10-CM

## 2013-10-06 DIAGNOSIS — J9612 Chronic respiratory failure with hypercapnia: Secondary | ICD-10-CM | POA: Insufficient documentation

## 2013-10-06 MED ORDER — AMOXICILLIN-POT CLAVULANATE 875-125 MG PO TABS: 1.0000 | ORAL_TABLET | Freq: Two times a day (BID) | ORAL | Status: DC

## 2013-10-06 MED ORDER — PREDNISONE 10 MG PO TABS: ORAL_TABLET | ORAL | Status: DC

## 2013-10-06 NOTE — Telephone Encounter (Signed)
Will need to know that oxygen liter flow MW wants the pt on.   Will need to fax a new order to anita at [facility] before she can get this started.  Can fax to [phone].  Will need the 88% in the chart from today to make sure that it states that was taken on room air at rest.  thanks

## 2013-10-06 NOTE — Patient Instructions (Addendum)
Augmentin 875 mg take one pill twice daily  X 10 days - take at breakfast and supper with large glass of water.  It would help reduce the usual side effects (diarrhea and yeast infections) if you ate cultured yogurt at lunch.   Prednisone 10 mg take  4 each am x 2 days,   2 each am x 2 days,  1 each am x 2 days and stop   Please see patient coordinator before you leave today  to schedule  [facility] to do a 02 titration and assess for modality for sats > 90% sats  Please remember to go to the   x-ray department downstairs for your tests - we will call you with the results when they are available.    Please schedule a follow up office visit in 6 weeks, call sooner if needed with pfts

## 2013-10-06 NOTE — Telephone Encounter (Signed)
Yes both

## 2013-10-06 NOTE — Progress Notes (Signed)
Quick Note:  Spoke with pt and notified of results per Dr. Wert. Pt verbalized understanding and denied any questions.  ______ 

## 2013-10-06 NOTE — Telephone Encounter (Signed)
Called and spoke with anita from [facility]---  She stated that the order she got from MW---she wanted to clarify the order.    She called and pt could not speak with her due to all the coughing she was doing--her son felt that the pt was going to be placed on oxygen since she is not on it now.  [name] wanted to see if MW was trying to put the pt on oxygen or did he just want the home assessment done first.  [name] said that they can do both. MW please advise. thanks

## 2013-10-06 NOTE — Progress Notes (Signed)
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 ILD    History of Present Illness  11/14/2012 1st [facility] office visit/ Wert cc ? 2012 p cold more doe then   Acute onset cough summer 2013 p exposure to cleaning agents  persistent cough and sob with exertion > adls.  Cough is dry and mostly daytime and assoc with constant sensation of pnds. rec Pantoprazole (protonix) 40 mg   Take 30-60 min before first meal of the day and Pepcid 20 mg one bedtime until return to office - this is the best way to tell whether stomach acid is contributing to your problem.   GERD diet      12/13/2012 f/u ov/Wert re: PF/ cough > sob Chief Complaint  Patient presents with  . Followup with PFT    Pt states her cough and SOB are much improved since the last visit. No new co's today.   not limited by doe with shopping and housework, cough resolved to her satisfaction  rec Continue the acid suppression and diet as you are  01/11/2013 f/u ov/Wert re: URI  Chief Complaint  Patient presents with  . Acute Visit    Pt c/o increased SOB and prod cough with minimal tan sputum for the past 3 days. She has also had chills but unsure of fever.   says now not really feeling good x one year but acutely worse x 3 days, assoc with sore throat. rec Augmentin 875 mg take one pill twice daily  X 10 days - . Prednisone 10 mg take  4 each am x 2 days,   2 each am x 2 days,  1 each am x 2 days and stop  Nasal steroid flyer info   04/03/2013 f/u ov/Wert re:  ?ILD  Chief Complaint  Patient presents with  . Follow-up    Pt states her SOB and cough are much improved since last visit. No new co's today.    rec Ok to zyrtec 10 mg one daily as needed for nasal drainage  Return 1st week in Jossilyn 2015 for pft and cxr > did not return as requested   10/06/2013 f/u ov/Wert re: acute flare of cough  Chief Complaint  Patient presents with  . Follow-up    Pt c/o increased cough for the past 5 days. Cough is prod with moderate yellow sputum.  She also c/o increased SOB with cough.   walking 2 laps to mb and back s stopping, goal is 10 laps - doesn't think worse x 6 m until acute flare x 5 days    No obvious day to day or daytime variabilty or assoc   cp or chest tightness, subjective wheeze overt sinus or hb symptoms. No unusual exp hx or h/o childhood pna/ asthma or knowledge of premature birth.  Sleeping ok without nocturnal  or early am exacerbation  of respiratory  c/o's or need for noct saba. Also denies any obvious fluctuation of symptoms with weather or environmental changes or other aggravating or alleviating factors except as outlined above   Current Medications, Allergies, Complete Past Medical History, Past Surgical History, Family History, and  Social History were reviewed in [employer] record.  ROS  The following are not active complaints unless bolded sore throat, dysphagia, dental problems, itching, sneezing,  nasal congestion or excess/ purulent secretions, ear ache,   fever, chills, sweats, unintended wt loss, pleuritic or exertional cp, hemoptysis,  orthopnea pnd or leg swelling, presyncope, palpitations, heartburn, abdominal pain, anorexia, nausea, vomiting, diarrhea  or change in bowel or urinary habits, change in stools or urine, dysuria,hematuria,  rash, arthralgias, visual complaints, headache, numbness weakness or ataxia or problems with walking or coordination,  change in mood/affect or memory.                Objective:   Physical Exam   Hoarse amb obese wf nad  04/03/2013        234 > 10/06/2013  231  Wt Readings from Last 3 Encounters:  01/11/13  238 lb 6.4 oz (108.138 kg)  12/13/12 246 lb (111.585 kg)  11/14/12 247 lb 12.8 oz (112.401 kg)           HEENT: nl dentition, turbinates, and orophanx. Nl external ear canals without cough reflex   NECK :  without JVD/Nodes/TM/ nl carotid upstrokes bilaterally   LUNGS: no acc muscle use, coarse bibasilar insp crackles   CV:  RRR  no s3 or murmur or increase in P2, no edema   ABD:  soft and nontender with nl excursion in the supine position. No bruits or organomegaly, bowel sounds nl  MS:  warm without deformities, calf tenderness, cyanosis - mild/moderate clubbing  SKIN: warm and dry without lesions         cxr 11/04/12  Suspect severe underlying chronic interstitial lung disease. There  are patchy bilateral airspace opacities. Cannot exclude superimposed  infection.   ESR 11/14/12 = 39 Eos 6.3%    10/06/13 CXR  Changes of pulmonary fibrosis are again seen. The appearance is not  markedly changed since the most recent plain films. No pneumothorax  or pleural effusion. Heart size is normal.  Assessment & Plan:

## 2013-10-06 NOTE — Assessment & Plan Note (Addendum)
Very unusual pattern of pf where she gets quite a bit better between ov's and doesn't even feel the need to keep f/u with "non uip" pattern on hRCT as well.    Definitely needs apples to apples comparisons on pfts/ walks between ov's when not sick > See instructions for specific recommendations which were reviewed directly with the patient who was given a copy with highlighter outlining the key components.

## 2013-10-07 NOTE — Telephone Encounter (Signed)
Start at 2lpm until they do the titration I requested  Office notes 10/07/14 document desat

## 2013-10-07 NOTE — Assessment & Plan Note (Signed)
Augmentin 875 mg take one pill twice daily  X 10 days - take at breakfast and supper with large glass of water.  It would help reduce the usual side effects (diarrhea and yeast infections) if you ate cultured yogurt at lunch.   Prednisone 10 mg take  4 each am x 2 days,   2 each am x 2 days,  1 each am x 2 days and stop  

## 2013-10-09 NOTE — Telephone Encounter (Signed)
This last ov note was faxed to the number below to anita.  Nothing further is needed

## 2013-10-10 ENCOUNTER — Other Ambulatory Visit: Payer: Self-pay | Admitting: Internal Medicine

## 2013-11-06 ENCOUNTER — Encounter: Payer: Self-pay | Admitting: Internal Medicine

## 2013-11-15 ENCOUNTER — Ambulatory Visit: Payer: Medicare Other | Admitting: Internal Medicine

## 2014-03-02 ENCOUNTER — Ambulatory Visit (INDEPENDENT_AMBULATORY_CARE_PROVIDER_SITE_OTHER): Payer: Medicare Other | Admitting: Internal Medicine

## 2014-03-02 ENCOUNTER — Encounter: Payer: Self-pay | Admitting: Internal Medicine

## 2014-03-02 VITALS — BP 124/72 | HR 90 | Ht 62.0 in | Wt 224.0 lb

## 2014-03-02 DIAGNOSIS — J8410 Pulmonary fibrosis, unspecified: Secondary | ICD-10-CM

## 2014-03-02 DIAGNOSIS — R0902 Hypoxemia: Secondary | ICD-10-CM

## 2014-03-02 DIAGNOSIS — L03116 Cellulitis of left lower limb: Secondary | ICD-10-CM

## 2014-03-02 MED ORDER — PREDNISONE 10 MG PO TABS: ORAL_TABLET | ORAL | Status: DC

## 2014-03-02 MED ORDER — CEFUROXIME AXETIL 250 MG PO TABS: 250.0000 mg | ORAL_TABLET | Freq: Two times a day (BID) | ORAL | Status: DC

## 2014-03-02 NOTE — Progress Notes (Signed)
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 2012 p cold more doe then   Acute onset cough summer 2013 p exposure to cleaning agents  persistent cough and sob with exertion > adls.  Cough is dry and mostly daytime and assoc with constant sensation of pnds. rec Pantoprazole (protonix) 40 mg   Take 30-60 min before first meal of the day and Pepcid 20 mg one bedtime until return to office - this is the best way to tell whether stomach acid is contributing to your problem.   GERD diet      04/03/2013 f/u ov/Miranda Hines re:  ?ILD  Chief Complaint  Patient presents with  . Follow-up    Pt states her SOB and cough are much improved since last visit. No new co's today.   rec Ok to zyrtec 10 mg one daily as needed for nasal drainage  Return 1st week in [place] 2015 for pft and cxr > did not return as requested   10/06/2013 f/u ov/Miranda Hines re: acute flare of cough  Chief Complaint  Patient presents with  . Follow-up    Pt c/o increased cough for the past 5 days. Cough is prod with moderate yellow sputum.  She also c/o increased SOB with cough.   walking 2 laps to mb and back s stopping, goal is 10 laps - doesn't think worse x 6 m until acute flare x 5 days rec Augmentin 875 mg take one pill twice daily  X 10 days - take at breakfast and supper with large glass of water.  It would help reduce the usual side effects (diarrhea and yeast infections) if you ate cultured yogurt at lunch.  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 Follow-up    Pt states overall her breathing is doing well. She is not coughing much.  When she does cough she produces "sandy color " to clear sputum.    2 laps no more, no worse than usual   Does think she gets transient improvement from pred rx  Concerned getting cellulitis LLE "like many times before" with recurrent rash but not pain or fever     No obvious day to day or daytime variabilty or assoc   cp or chest tightness, subjective wheeze overt sinus or hb symptoms. No unusual exp hx or h/o childhood pna/ asthma or knowledge of premature birth.  Sleeping ok without nocturnal  or early am exacerbation  of respiratory  c/o's or need for noct saba. Also denies any obvious fluctuation of symptoms with weather or environmental changes or other aggravating  or alleviating factors except as outlined above   Current Medications, Allergies, Complete Past Medical History, Past Surgical History, Family History, and Social History were reviewed in [employer] record.  ROS  The following are not active complaints unless bolded sore throat, dysphagia, dental problems, itching, sneezing,  nasal congestion or excess/ purulent secretions, ear ache,   fever, chills, sweats, unintended wt loss, pleuritic or exertional cp, hemoptysis,  orthopnea pnd or leg swelling, presyncope, palpitations, heartburn, abdominal pain, anorexia, nausea, vomiting, diarrhea  or change in bowel or urinary habits, change in stools or urine, dysuria,hematuria,  rash, arthralgias, visual complaints, headache, numbness weakness or ataxia or problems with walking or coordination,  change in mood/affect or memory.                Objective:   Physical  Exam   Hoarse amb obese wf nad  04/03/2013        234 > 10/06/2013  231 > 03/02/2014   224 Wt Readings from Last 3 Encounters:  01/11/13 238 lb 6.4 oz (108.138 kg)  12/13/12 246 lb (111.585 kg)  11/14/12 247 lb 12.8 oz (112.401 kg)           HEENT: nl dentition, turbinates, and orophanx. Nl external ear canals without cough reflex   NECK :  without JVD/Nodes/TM/ nl carotid upstrokes bilaterally   LUNGS: no acc muscle use, coarse bibasilar insp crackles   CV:  RRR  no s3 or murmur or increase in P2, no edema   ABD:  soft and nontender with nl excursion in the supine position. No bruits or organomegaly, bowel sounds nl  MS:  warm without deformities, calf tenderness, cyanosis - mild/moderate clubbing  SKIN: warm and dry / several plaque like erythematous lesions LLE x sev cm diameter "this is how my cellulitis always looks when it starts"             Assessment & Plan:

## 2014-03-02 NOTE — Patient Instructions (Addendum)
ceftin 250 mg twice daily x 10 days (took keflex fine)   Prednisone Take 4 for three days 3 for three days 2 for three days 1 for three days and stop   Please schedule a follow up office visit in 2 weeks, sooner if needed  Late add needs pfts and hrct repeated if haven't corrected the desats with 02

## 2014-03-03 ENCOUNTER — Encounter: Payer: Self-pay | Admitting: Internal Medicine

## 2014-03-03 DIAGNOSIS — L0390 Cellulitis, unspecified: Secondary | ICD-10-CM | POA: Insufficient documentation

## 2014-03-03 NOTE — Assessment & Plan Note (Signed)
-   12/13/2012  Walked RA  2 laps @ 185 ft each stopped due to desat 80% s sob at very rapid pace - 04/03/2013   Walked RA x one lap @ 185 stopped due to desat 81%  - 10/06/2013   Walked RA @ slow pace x 20 ft stopped due to sat 81%   >   02 titration done by [facility] 10/06/13 ok on 2lpm POC   - 03/02/2014   Walked RA x one lap @ 185 stopped due to  desat to 83 on 3lpm pulsed but not sob/ nl pace > have [facility] repeat titration p prednisone rx complete

## 2014-03-03 NOTE — Assessment & Plan Note (Signed)
This does not look like cellulitis to me but she insists that cefalosporins are needed or it will get a lot worse so ok to rx with ceftin x 10 da nad f/u primary care or derm prn

## 2014-03-03 NOTE — Assessment & Plan Note (Addendum)
-  onset of symptoms Summer 2013  - Labs 02/15/12 ESR 39, Eso 6.3% - HRCT 11/17/12 1. 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 Chronic HSP is not excluded. Pattern is not consistent with usual interstitial pneumonitis (UIP). 2. Enlarged pulmonary arteries, indicative of pulmonary arterial Hypertension. - PFT's 12/13/2012 VC 1.16 (42%) and no obst and DLCO 42% corrects to  107  - started amb 02 10/07/2013 per [facility] (see ex hypoxemia)  Unusual pattern ? Steroid responsive > rx pred x 12 days this time then bring back for pfts and ? Repeat hrct   Use of PPI is associated with improved survival time and with decreased radiologic fibrosis per King's study published in AJRCCM vol 184 p1390.  Dec 2011  This may not be cause and effect, but given how universally unhelpful all the other study drugs have been for reversing or even completely stabilizing pf,   rec continue   rx ppi / diet/ lifestyle modification.

## 2014-03-16 ENCOUNTER — Encounter: Payer: Self-pay | Admitting: Internal Medicine

## 2014-03-16 ENCOUNTER — Ambulatory Visit (INDEPENDENT_AMBULATORY_CARE_PROVIDER_SITE_OTHER): Payer: Medicare Other | Admitting: Internal Medicine

## 2014-03-16 VITALS — BP 114/66 | HR 88 | Ht 62.0 in | Wt 227.0 lb

## 2014-03-16 DIAGNOSIS — L0390 Cellulitis, unspecified: Secondary | ICD-10-CM

## 2014-03-16 DIAGNOSIS — R0902 Hypoxemia: Secondary | ICD-10-CM

## 2014-03-16 DIAGNOSIS — J8410 Pulmonary fibrosis, unspecified: Secondary | ICD-10-CM

## 2014-03-16 MED ORDER — CEFUROXIME AXETIL 250 MG PO TABS: 250.0000 mg | ORAL_TABLET | Freq: Two times a day (BID) | ORAL | Status: DC

## 2014-03-16 NOTE — Patient Instructions (Addendum)
ceftin 250 twice daily x 10 days - if skin not better we need to have you see a Dermatologist   Ok to adjust you 02 on your own except don't do it at bedtime > use 2lpm at bedtime and during the day just adjust the 02 up down or off for sats >90% and we will hold off the 02 titration by the company for now   Please schedule a follow up office visit in 2 weeks, sooner if needed  Add needs skin bx for ? Sarcoid next ov

## 2014-03-16 NOTE — Progress Notes (Signed)
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 2012 p cold more doe then   Acute onset cough summer 2013 p exposure to cleaning agents  persistent cough and sob with exertion > adls.  Cough is dry and mostly daytime and assoc with constant sensation of pnds. rec Pantoprazole (protonix) 40 mg   Take 30-60 min before first meal of the day and Pepcid 20 mg one bedtime until return to office - this is the best way to tell whether stomach acid is contributing to your problem.   GERD diet      04/03/2013 f/u ov/Miranda Hines re:  ?ILD  Chief Complaint  Patient presents with  . Follow-up    Pt states her SOB and cough are much improved since last visit. No new co's today.   rec Ok to zyrtec 10 mg one daily as needed for nasal drainage  Return 1st week in [place] 2015 for pft and cxr > did not return as requested   10/06/2013 f/u ov/Miranda Hines re: acute flare of cough  Chief Complaint  Patient presents with  . Follow-up    Pt c/o increased cough for the past 5 days. Cough is prod with moderate yellow sputum.  She also c/o increased SOB with cough.   walking 2 laps to mb and back s stopping, goal is 10 laps - doesn't think worse x 6 m until acute flare x 5 days rec Augmentin 875 mg take one pill twice daily  X 10 days - take at breakfast and supper with large glass of water.  It would help reduce the usual side effects (diarrhea and yeast infections) if you ate cultured yogurt at lunch.  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 Follow-up    Pt states overall her breathing is doing well. She is not coughing much.  When she does cough she produces "sandy color " to clear sputum.    2 laps to MB,   no worse doe  than usual   Does think she gets transient improvement from pred rx  Concerned getting cellulitis LLE "like many times before" with recurrent rash but not pain or fever  rec ceftin 250 mg twice daily x 10 days (took keflex fine)  Prednisone Take 4 for three days 3 for three days 2 for three days 1 for three days and stop  Please schedule a follow up office visit in 2 weeks, sooner if needed  Late add needs pfts and hrct repeated if haven't corrected the desats with 02   03/16/2014 f/u ov/Miranda Hines re:  PF  Chief Complaint  Patient presents with  . Follow-up    Pt states that her breathing is much improved since the last visit. Her cough has almost completely resolved. No new co's today.  walking mb and back s 02 but also without monitoring her sats.    No obvious day to day or daytime variabilty or assoc cough or   cp or chest tightness, subjective wheeze overt sinus or hb symptoms. No unusual exp hx or h/o childhood pna/ asthma or knowledge of premature birth.  Sleeping ok without nocturnal  or early am exacerbation  of respiratory  c/o's or need for noct saba. Also denies any obvious fluctuation of symptoms with weather or environmental changes or other aggravating or alleviating factors except as outlined above   Current Medications, Allergies, Complete Past Medical History, Past Surgical History, Family History, and Social History were reviewed in [facility] electronic medical record.  ROS  The following are not active complaints unless  bolded sore throat, dysphagia, dental problems, itching, sneezing,  nasal congestion or excess/ purulent secretions, ear ache,   fever, chills, sweats, unintended wt loss, pleuritic or exertional cp, hemoptysis,  orthopnea pnd or leg swelling, presyncope, palpitations, heartburn, abdominal pain, anorexia, nausea, vomiting, diarrhea  or change in bowel or urinary habits, change in stools or urine, dysuria,hematuria,  rash, arthralgias, visual complaints, headache, numbness weakness or ataxia or problems with walking or coordination,  change in mood/affect or memory.                Objective:   Physical Exam   Hoarse amb obese wf nad  04/03/2013        234 > 10/06/2013  231 > 03/02/2014   224> 03/16/2014  248  Wt Readings from Last 3 Encounters:  01/11/13 238 lb 6.4 oz (108.138 kg)  12/13/12 246 lb (111.585 kg)  11/14/12 247 lb 12.8 oz (112.401 kg)           HEENT: nl dentition, turbinates, and orophanx. Nl external ear canals without cough reflex   NECK :  without JVD/Nodes/TM/ nl carotid upstrokes bilaterally   LUNGS: no acc muscle use, minimal bibasilar insp crackles   CV:  RRR  no s3 or murmur or increase in P2, no edema   ABD:  soft and nontender with nl excursion in the supine position. No bruits or organomegaly, bowel sounds nl  MS:  warm without deformities, calf tenderness, cyanosis - mild/moderate clubbing  SKIN: warm and dry / several plaque like erythematous lesions LLE x sev cm diameter- no change from prior exam             Assessment & Plan:

## 2014-03-17 ENCOUNTER — Encounter: Payer: Self-pay | Admitting: Internal Medicine

## 2014-03-17 NOTE — Assessment & Plan Note (Signed)
-   12/13/2012  Walked RA  2 laps @ 185 ft each stopped due to desat 80% s sob at very rapid pace - 04/03/2013   Walked RA x one lap @ 185 stopped due to desat 81%  - 10/06/2013   Walked RA @ slow pace x 20 ft stopped due to sat 81%   >   02 titration done by [facility] 10/06/13 ok on 2lpm POC   - 03/02/2014   Walked RA x one lap @ 185 stopped due to  desat to 83 on 3lpm pulsed but not sob/ nl pace  Ok to adjust 02 flow to maintain sats over 90% but for now continue 02 at 2lpm at hs

## 2014-03-17 NOTE — Assessment & Plan Note (Signed)
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

## 2014-03-17 NOTE — Assessment & Plan Note (Addendum)
-  onset of symptoms Summer 2013  - Labs 02/15/12 ESR 39, Eso 6.3% - HRCT 11/17/12 1. 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 Chronic HSP is not excluded. Pattern is not consistent with usual interstitial pneumonitis (UIP). 2. Enlarged pulmonary arteries, indicative of pulmonary arterial Hypertension. - PFT's 12/13/2012 VC 1.16 (42%) and no obst and DLCO 42% corrects to  107  - started amb 02 10/07/2013 per [facility] (see ex hypoxemia)  I had an extended discussion with the patient reviewing all relevant studies completed to date and  lasting 15 to 20 minutes of a 25 minute visit on the following ongoing concerns:   ? If this could be sarcoid assoc with rash > note better p pred but she also received abx and  she's convinced she has cellulitis/ sepsis/ali and wants to hold off further w/u until she "gets the celluitis under control" so no more steroids/ ok to use the ceftin then if rash persists needs derm eval/bx as I am highly skeptical this is any kind of cellulitis based on appearance and lack of fever/sirs with flares.  See instructions for specific recommendations which were reviewed directly with the patient who was given a copy with highlighter outlining the key components.

## 2014-03-30 ENCOUNTER — Other Ambulatory Visit (INDEPENDENT_AMBULATORY_CARE_PROVIDER_SITE_OTHER): Payer: Medicare Other

## 2014-03-30 ENCOUNTER — Encounter: Payer: Self-pay | Admitting: Internal Medicine

## 2014-03-30 ENCOUNTER — Ambulatory Visit (INDEPENDENT_AMBULATORY_CARE_PROVIDER_SITE_OTHER): Payer: Medicare Other | Admitting: Internal Medicine

## 2014-03-30 VITALS — BP 120/84 | HR 71 | Ht 62.0 in | Wt 228.2 lb

## 2014-03-30 DIAGNOSIS — R21 Rash and other nonspecific skin eruption: Secondary | ICD-10-CM

## 2014-03-30 DIAGNOSIS — R0902 Hypoxemia: Secondary | ICD-10-CM

## 2014-03-30 DIAGNOSIS — R0600 Dyspnea, unspecified: Secondary | ICD-10-CM

## 2014-03-30 DIAGNOSIS — J8410 Pulmonary fibrosis, unspecified: Secondary | ICD-10-CM

## 2014-03-30 LAB — CBC WITH DIFFERENTIAL/PLATELET
Eosinophils Relative: 6 % — ABNORMAL HIGH (ref 0.0–5.0)
HCT: 37.3 % (ref 36.0–46.0)
Hemoglobin: 12.6 g/dL (ref 12.0–15.0)
Lymphocytes Relative: 37 % (ref 12.0–46.0)
MCHC: 33.8 g/dL (ref 30.0–36.0)
MCV: 95.8 fl (ref 78.0–100.0)
Monocytes Relative: 8 % (ref 3.0–12.0)
Neutrophils Relative %: 54 % (ref 43.0–77.0)
PLATELETS: 219 10*3/uL (ref 150.0–400.0)
RBC: 3.89 Mil/uL (ref 3.87–5.11)
RDW: 13.3 % (ref 11.5–15.5)
WBC: 6.5 10*3/uL (ref 4.0–10.5)

## 2014-03-30 LAB — SEDIMENTATION RATE: Sed Rate: 84 mm/hr — ABNORMAL HIGH (ref 0–22)

## 2014-03-30 MED ORDER — PREDNISONE 10 MG PO TABS: ORAL_TABLET | ORAL | Status: DC

## 2014-03-30 NOTE — Patient Instructions (Addendum)
Ok to adjust your 02 for sats > 90% daytime but always use the 02 2lpm at bedtime  Please remember to go to the lab  department downstairs for your tests - we will call you with the results when they are available.    Prednisone 10 mg Take 4 for three days 3 for three days 2 for three days 1 for three days and stop   Please see patient coordinator before you leave today  to schedule dermatology eval asap   Please schedule a follow up office visit in 4 weeks, sooner if needed

## 2014-03-30 NOTE — Progress Notes (Signed)
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 2012 p cold more doe then   Acute onset cough summer 2013 p exposure to cleaning agents  persistent cough and sob with exertion > adls.  Cough is dry and mostly daytime and assoc with constant sensation of pnds. rec Pantoprazole (protonix) 40 mg   Take 30-60 min before first meal of the day and Pepcid 20 mg one bedtime until return to office - this is the best way to tell whether stomach acid is contributing to your problem.   GERD diet      04/03/2013 f/u ov/Miranda Hines re:  ?ILD  Chief Complaint  Patient presents with  . Follow-up    Pt states her SOB and cough are much improved since last visit. No new co's today.   rec Ok to zyrtec 10 mg one daily as needed for nasal drainage  Return 1st week in [place] 2015 for pft and cxr > did not return as requested   10/06/2013 f/u ov/Miranda Hines re: acute flare of cough  Chief Complaint  Patient presents with  . Follow-up    Pt c/o increased cough for the past 5 days. Cough is prod with moderate yellow sputum.  She also c/o increased SOB with cough.   walking 2 laps to mb and back s stopping, goal is 10 laps - doesn't think worse x 6 m until acute flare x 5 days rec Augmentin 875 mg take one pill twice daily  X 10 days - take at breakfast and supper with large glass of water.  It would help reduce the usual side effects (diarrhea and yeast infections) if you ate cultured yogurt at lunch.  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 Follow-up    Pt states overall her breathing is doing well. She is not coughing much.  When she does cough she produces "sandy color " to clear sputum.    2 laps to MB,   no worse doe  than usual   Does think she gets transient improvement from pred rx  Concerned getting cellulitis LLE "like many times before" with recurrent rash but not pain or fever  rec ceftin 250 mg twice daily x 10 days (took keflex fine)  Prednisone Take 4 for three days 3 for three days 2 for three days 1 for three days and stop  Please schedule a follow up office visit in 2 weeks, sooner if needed  Late add needs pfts and hrct repeated if haven't corrected the desats with 02   03/16/2014 f/u ov/Miranda Hines re:  PF  Chief Complaint  Patient presents with  . Follow-up    Pt states that her breathing is much improved since the last visit. Her cough has almost completely resolved. No new co's today.  walking mb and back s 02 but also without monitoring her sats.   rec ceftin 250 twice daily x 10 days - if skin not better we need to have you see a Dermatologist  Ok to adjust you 02 on your own except don't do it at bedtime > use 2lpm at bedtime and during the day just adjust the 02 up down or off for sats >90% and we will hold off the     Add needs skin bx for ? Sarcoid next ov    03/30/2014 f/u ov/Miranda Hines re: PF/ rash ? Could this be sarcoid?  Chief Complaint  Patient presents with  . Follow-up    Last seen 03/16/14. States feeling well. Denies SOB, wheezing, chest pain or tightness, or cough    in general sitting stll RA s 02 88-90% at home per pt monitoring  Walking 02 2lpm x 5 trips on 02 to mailbox and back  Sleep on 2lpm    No obvious day to day or daytime  variabilty or assoc cough or   cp or chest tightness, subjective wheeze overt sinus or hb symptoms. No unusual exp hx or h/o childhood pna/ asthma or knowledge of premature birth.  Sleeping ok without nocturnal  or early am exacerbation  of respiratory  c/o's or need for noct saba. Also denies any obvious fluctuation of symptoms with weather or environmental changes or other aggravating or alleviating factors except as outlined above   Current Medications, Allergies, Complete Past Medical History, Past Surgical History, Family History, and Social History were reviewed in [employer] record.  ROS  The following are not active complaints unless bolded sore throat, dysphagia, dental problems, itching, sneezing,  nasal congestion or excess/ purulent secretions, ear ache,   fever, chills, sweats, unintended wt loss, pleuritic or exertional cp, hemoptysis,  orthopnea pnd or leg swelling, presyncope, palpitations, heartburn, abdominal pain, anorexia, nausea, vomiting, diarrhea  or change in bowel or urinary habits, change in stools or urine, dysuria,hematuria,  rash, arthralgias, visual complaints, headache, numbness weakness or ataxia or problems with walking or coordination,  change in mood/affect or memory.                Objective:   Physical Exam     amb obese wf nad  04/03/2013        234 > 10/06/2013  231 > 03/02/2014   224> 03/16/2014  248 > 03/30/2014  228  Wt Readings from Last 3 Encounters:  01/11/13 238 lb 6.4 oz (108.138 kg)  12/13/12 246 lb (111.585 kg)  11/14/12 247 lb 12.8 oz (112.401 kg)           HEENT: nl dentition, turbinates, and orophanx. Nl external ear canals without cough reflex   NECK :  without JVD/Nodes/TM/ nl carotid upstrokes bilaterally   LUNGS: no acc muscle use, bibasilar insp crackles s cough    CV:  RRR  no s3 or murmur or increase in P2, no edema   ABD:  soft and nontender with nl excursion in the supine position. No bruits or  organomegaly, bowel sounds nl  MS:  warm without deformities, calf tenderness, cyanosis - mild/moderate clubbing  SKIN: warm and dry / several plaque like erythematous lesions LLE x sev cm diameter- no change from prior  exam         I personally reviewed images and agree with radiology impression as follows:  CXR:  10/06/13 Extensive pulmonary fibrosis without acute disease.      Assessment & Plan:

## 2014-03-31 ENCOUNTER — Encounter: Payer: Self-pay | Admitting: Internal Medicine

## 2014-03-31 NOTE — Assessment & Plan Note (Signed)
-   12/13/2012  Walked RA  2 laps @ 185 ft each stopped due to desat 80% s sob at very rapid pace - 04/03/2013   Walked RA x one lap @ 185 stopped due to desat 81%  - 10/06/2013   Walked RA @ slow pace x 20 ft stopped due to sat 81%   >   02 titration done by [facility] 10/06/13 ok on 2lpm POC   - 03/02/2014   Walked  x one lap @ 185 stopped due to  desat to 83 on 3lpm pulsed but not sob/ nl pace - 03/30/2014   Walked 2lpmx one lap @ 185 stopped due to  sats down to 72 but no sob/ slow pace   Difficult to follow her clinically because she doesn't appreciate when she's hypoxic so needs to self monitor/ titrate  See instructions for specific recommendations which were reviewed directly with the patient who was given a copy with highlighter outlining the key components.

## 2014-03-31 NOTE — Assessment & Plan Note (Signed)
No change since prev exam p rx for "cellulitis" requested by pt > needs skin bx ?could this be sarcoid?

## 2014-03-31 NOTE — Assessment & Plan Note (Signed)
-  onset of symptoms Summer 2013  - Labs 02/15/12 ESR 39, Eso 6.3% - HRCT 11/17/12 1. 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 Chronic HSP is not excluded. Pattern is not consistent with usual interstitial pneumonitis (UIP). 2. Enlarged pulmonary arteries, indicative of pulmonary arterial Hypertension. - PFT's 12/13/2012 VC 1.16 (42%) and no obst and DLCO 42% corrects to  107  - started amb 02 10/07/2013 per [facility] (see ex hypoxemia)  Very unusual course and now worsening sats with amb but no cough or sob.  Given assoc atypical rash rec skin bx next  Plus 12 day trial of prednisone: Take 4 for three days 3 for three days 2 for three days 1 for three days and stop

## 2014-04-02 ENCOUNTER — Telehealth: Payer: Self-pay | Admitting: Internal Medicine

## 2014-04-02 LAB — HEPATIC FUNCTION PANEL
ALBUMIN: 3.8 g/dL (ref 3.5–5.2)
ALK PHOS: 85 U/L (ref 39–117)
ALT: 10 U/L (ref 0–35)
AST: 22 U/L (ref 0–37)
BILIRUBIN DIRECT: 0.2 mg/dL (ref 0.0–0.3)
TOTAL PROTEIN: 7.6 g/dL (ref 6.0–8.3)
Total Bilirubin: 0.8 mg/dL (ref 0.2–1.2)

## 2014-04-02 LAB — BASIC METABOLIC PANEL
BUN: 11 mg/dL (ref 6–23)
CO2: 33 meq/L — AB (ref 19–32)
Calcium: 9.3 mg/dL (ref 8.4–10.5)
Chloride: 103 mEq/L (ref 96–112)
Creatinine, Ser: 0.6 mg/dL (ref 0.40–1.20)
GFR: 104.58 mL/min (ref >60.00)
Glucose, Bld: 93 mg/dL (ref 70–99)
Potassium: 3.7 mEq/L (ref 3.5–5.1)
Sodium: 143 mEq/L (ref 135–145)

## 2014-04-02 LAB — ANGIOTENSIN CONVERTING ENZYME: ANGIOTENSIN-CONVERTING ENZYME: 53 U/L — AB (ref 8–52)

## 2014-04-02 LAB — BRAIN NATRIURETIC PEPTIDE: Pro B Natriuretic peptide (BNP): 132 pg/mL — ABNORMAL HIGH (ref 0.0–100.0)

## 2014-04-02 MED ORDER — MONTELUKAST SODIUM 10 MG PO TABS: 10.0000 mg | ORAL_TABLET | Freq: Every day | ORAL | Status: DC

## 2014-04-02 NOTE — Telephone Encounter (Signed)
Called spoke w/ pt. She is wanting us to call in singulair for her. She reports her brother takes this and it works well for him. She is requesting RX as well. Please advise MW thanks

## 2014-04-02 NOTE — Telephone Encounter (Signed)
Rx sent to pharmacy. Patient notified. Nothing further needed.  

## 2014-04-02 NOTE — Telephone Encounter (Signed)
Ok with me - 10 mg each pm

## 2014-04-27 ENCOUNTER — Encounter: Payer: Self-pay | Admitting: Internal Medicine

## 2014-04-27 ENCOUNTER — Ambulatory Visit (INDEPENDENT_AMBULATORY_CARE_PROVIDER_SITE_OTHER): Payer: Medicare Other | Admitting: Internal Medicine

## 2014-04-27 VITALS — BP 122/72 | HR 95 | Ht 62.0 in | Wt 227.0 lb

## 2014-04-27 DIAGNOSIS — J8410 Pulmonary fibrosis, unspecified: Secondary | ICD-10-CM

## 2014-04-27 DIAGNOSIS — J9612 Chronic respiratory failure with hypercapnia: Secondary | ICD-10-CM | POA: Diagnosis not present

## 2014-04-27 MED ORDER — PREDNISONE 10 MG PO TABS: ORAL_TABLET | ORAL | Status: DC

## 2014-04-27 NOTE — Progress Notes (Signed)
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 2012 p cold more doe then   Acute onset cough summer 2013 p exposure to cleaning agents  persistent cough and sob with exertion > adls.  Cough is dry and mostly daytime and assoc with constant sensation of pnds. rec Pantoprazole (protonix) 40 mg   Take 30-60 min before first meal of the day and Pepcid 20 mg one bedtime until return to office - this is the best way to tell whether stomach acid is contributing to your problem.   GERD diet      04/03/2013 f/u ov/Miranda Hines re:  ?ILD  Chief Complaint  Patient presents with  . Follow-up    Pt states her SOB and cough are much improved since last visit. No new co's today.   rec Ok to zyrtec 10 mg one daily as needed for nasal drainage  Return 1st week in [place] 2015 for pft and cxr > did not return as requested   10/06/2013 f/u ov/Miranda Hines re: acute flare of cough  Chief Complaint  Patient presents with  . Follow-up    Pt c/o increased cough for the past 5 days. Cough is prod with moderate yellow sputum.  She also c/o increased SOB with cough.   walking 2 laps to mb and back s stopping, goal is 10 laps - doesn't think worse x 6 m until acute flare x 5 days rec Augmentin 875 mg take one pill twice daily  X 10 days - take at breakfast and supper with large glass of water.  It would help reduce the usual side effects (diarrhea and yeast infections) if you ate cultured yogurt at lunch.  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 Follow-up    Pt states overall her breathing is doing well. She is not coughing much.  When she does cough she produces "sandy color " to clear sputum.    2 laps to MB,   no worse doe  than usual   Does think she gets transient improvement from pred rx  Concerned getting cellulitis LLE "like many times before" with recurrent rash but not pain or fever  rec ceftin 250 mg twice daily x 10 days (took keflex fine)  Prednisone Take 4 for three days 3 for three days 2 for three days 1 for three days and stop  Please schedule a follow up office visit in 2 weeks, sooner if needed  Late add needs pfts and hrct repeated if haven't corrected the desats with 02   03/16/2014 f/u ov/Miranda Hines re:  PF  Chief Complaint  Patient presents with  . Follow-up    Pt states that her breathing is much improved since the last visit. Her cough has almost completely resolved. No new co's today.  walking mb and back s 02 but also without monitoring her sats.   rec ceftin 250 twice daily x 10 days - if skin not better we need to have you see a Dermatologist  Ok to adjust you 02 on your own except don't do it at bedtime > use 2lpm at bedtime and during the day just adjust the 02 up down or off for sats >90% and we will hold off the     Add needs skin bx for ? Sarcoid next ov    03/30/2014 f/u ov/Miranda Hines re: PF/ rash ? Could this be sarcoid?  Chief Complaint  Patient presents with  . Follow-up    Last seen 03/16/14. States feeling well. Denies SOB, wheezing, chest pain or tightness, or cough    in general sitting stll RA s 02 88-90% at home per pt monitoring  Walking 02 2lpm x 5 trips on 02 to mailbox and back  Sleep on 2lpm  rec Ok to adjust your 02 for sats > 90%  daytime but always use the 02 2lpm at bedtime Please remember to go to the lab  department downstairs for your tests - we will call you with the results when they are available. Prednisone 10 mg Take 4 for three days 3 for three days 2 for three days 1 for three days and stop  Please see patient coordinator before you leave today  to schedule dermatology eval asap > dx was venous stasis dermatitis   04/27/2014 f/u ov/Miranda Hines re: pf/ chronic resp failure/ steroid responsive / not adjusting 02 up as rec  Chief Complaint  Patient presents with  . Follow-up    Pt states that her breathing was doing great until stopped pred 2 wks ago.  She was SOB walking from her car to our building today.      Only does well with breathing while on prednisone.   No obvious day to day or daytime variabilty or assoc cough or   cp or chest tightness, subjective wheeze overt sinus or hb symptoms. No unusual exp hx or h/o childhood pna/ asthma or knowledge of premature birth.  Sleeping ok without nocturnal  or early am exacerbation  of respiratory  c/o's or need for noct saba. Also denies any obvious fluctuation of symptoms with weather or environmental changes or other aggravating or alleviating factors except as outlined above   Current Medications, Allergies, Complete Past Medical History, Past Surgical History, Family History, and Social History were reviewed in [facility] electronic medical record.  ROS  The following are not active complaints unless bolded sore throat, dysphagia, dental problems, itching, sneezing,  nasal congestion or excess/ purulent secretions, ear ache,   fever, chills, sweats, unintended wt loss, pleuritic or exertional cp, hemoptysis,  orthopnea pnd or leg swelling, presyncope, palpitations, heartburn, abdominal pain, anorexia, nausea, vomiting, diarrhea  or change in bowel or urinary habits, change in stools or urine, dysuria,hematuria,  rash, arthralgias, visual complaints, headache,  numbness weakness or ataxia or problems with walking or coordination,  change in mood/affect or memory.                Objective:   Physical Exam     amb obese wf nad  04/03/2013        234 > 10/06/2013  231 > 03/02/2014   224> 03/16/2014  248 > 03/30/2014  228 > 04/27/2014   227  Wt Readings from Last 3 Encounters:  01/11/13 238 lb 6.4 oz (108.138 kg)  12/13/12 246 lb (111.585 kg)  11/14/12 247 lb 12.8 oz (112.401 kg)           HEENT: nl dentition, turbinates, and orophanx. Nl external ear canals without cough reflex   NECK :  without JVD/Nodes/TM/ nl carotid upstrokes bilaterally   LUNGS: no acc muscle use, bibasilar insp crackles s cough    CV:  RRR  no s3 or murmur or increase in P2, no edema   ABD:  soft and nontender with nl excursion in the supine position. No bruits or organomegaly, bowel sounds nl  MS:  warm without deformities, calf tenderness, cyanosis - mild/moderate clubbing  SKIN: warm and dry / several plaque like erythematous lesions LLE x sev cm diameter- no change from prior exam and new ? Venous stasis changes both lower extremeties         I personally reviewed images and agree with radiology impression as follows:  CXR:  10/06/13 Extensive pulmonary fibrosis without acute disease.      Assessment & Plan:

## 2014-04-27 NOTE — Patient Instructions (Addendum)
Prednisone 10 mg take  4 each am x 2 days,   2 each am x 2 days,  1 each am x 2 days and stop    Keep appointment with Dr SwazilandJordan and let her know I'm concerned about Sarcoidosis affecting lung and skin of your Left leg   Please schedule a follow up office visit in 4 weeks, sooner if needed

## 2014-04-28 ENCOUNTER — Encounter: Payer: Self-pay | Admitting: Internal Medicine

## 2014-04-28 NOTE — Assessment & Plan Note (Signed)
-  onset of symptoms Summer 2013  - Labs 02/15/12 ESR 39, Eso 6.3% - HRCT 11/17/12 1. Pulmonary parenchymal pattern of interstitial lung disease may be due to end-stage sarcoid or postinflammatory fibrosis. Chronic HSP is not excluded. Pattern is not consistent with usual interstitial pneumonitis (UIP). 2. Enlarged pulmonary arteries, indicative of pulmonary arterial Hypertension. - PFT's 12/13/2012 VC 1.16 (42%) and no obst and DLCO 42% corrects to  107  - started amb 02 10/07/2013 per [facility] (see ex hypoxemia) - ESR 03/30/14 = 84/ ACE level 53 with nl calcium and prot/alb - pred x 12 day course 03/30/14> improved symptoms transiently    I had an extended discussion with the patient reviewing all relevant studies completed to date and  lasting 15 to 20 minutes of a 25 minute visit on the following ongoing concerns:  1)  She clearly does not have typical uip/ipf either by HRCT or clinically with a very convincing inflammatory component / steroid responsiveness and rash on L leg that is atypcial for sarcoid but also different from her venous stasis pattern ? Could this be granuomatous ????   2) therefore would favor skin bx first then start pred 20 until better and taper using the strategy of the lowest dose that works is the right one.  3) in meatime ok for short course pred only  4) Each maintenance medication was reviewed in detail including most importantly the difference between maintenance and as needed and under what circumstances the prns are to be used.  Please see instructions for details which were reviewed in writing and the patient given a copy.

## 2014-04-28 NOTE — Assessment & Plan Note (Signed)
-   12/13/2012  Walked RA  2 laps @ 185 ft each stopped due to desat 80% s sob at very rapid pace - 04/03/2013   Walked RA x one lap @ 185 stopped due to desat 81%  - 10/06/2013   Walked RA @ slow pace x 20 ft stopped due to sat 81%   >   02 titration done by [facility] 10/06/13 ok on 2lpm POC   - 03/02/2014   Walked  x one lap @ 185 stopped due to  desat to 83 on 3lpm pulsed but not sob/ nl pace - 03/30/2014   Walked 2lpmx one lap @ 185 stopped due to  sats down to 72 but no sob/ slow pace  - 03/30/14 HC03 33   As of 04/27/14 rec 2lpm at rest and titrate 02 with activity for sat > 90%

## 2014-05-15 ENCOUNTER — Telehealth: Payer: Self-pay | Admitting: Internal Medicine

## 2014-05-15 MED ORDER — PREDNISONE 10 MG PO TABS: ORAL_TABLET | ORAL | Status: DC

## 2014-05-15 MED ORDER — DOXYCYCLINE HYCLATE 100 MG PO TABS: 100.0000 mg | ORAL_TABLET | Freq: Two times a day (BID) | ORAL | Status: DC

## 2014-05-15 NOTE — Telephone Encounter (Signed)
Doxy 100 bid x 10 days Prednisone 10 mg take  4 each am x 2 days,   2 each am x 2 days,  1 each am x 2 days and stop

## 2014-05-15 NOTE — Telephone Encounter (Signed)
Pt aware of recs. RX sent in. Nothing further needed 

## 2014-05-15 NOTE — Telephone Encounter (Signed)
Per 04/27/14 OV: Patient Instructions       Prednisone 10 mg take  4 each am x 2 days,   2 each am x 2 days,  1 each am x 2 days and stop   Keep appointment with Dr Swaz[place]Jordan and let her know I'm concerned about Sarcoidosis affecting lung and skin of your Left leg  Please schedule a follow up office visit in 4 weeks, sooner if needed  --  Spoke with pt. She c/o prod cough (yellow-green phlem), nasal cong, PND x couple days. No wheezing, no chest tx, no SOB.  She wants ABX called in. Pt BX on leg will not come back for 10 days. appt scheduled for 05/25/14. Please advise MW thanks  Allergies  Allergen Reactions  . Penicillins Swelling

## 2014-05-25 ENCOUNTER — Ambulatory Visit (INDEPENDENT_AMBULATORY_CARE_PROVIDER_SITE_OTHER): Payer: Medicare Other | Admitting: Internal Medicine

## 2014-05-25 ENCOUNTER — Encounter: Payer: Self-pay | Admitting: Internal Medicine

## 2014-05-25 VITALS — BP 116/64 | HR 76 | Ht 62.0 in | Wt 222.0 lb

## 2014-05-25 DIAGNOSIS — J8410 Pulmonary fibrosis, unspecified: Secondary | ICD-10-CM | POA: Diagnosis not present

## 2014-05-25 DIAGNOSIS — J9612 Chronic respiratory failure with hypercapnia: Secondary | ICD-10-CM | POA: Diagnosis not present

## 2014-05-25 MED ORDER — PREDNISONE 10 MG PO TABS: ORAL_TABLET | ORAL | Status: DC

## 2014-05-25 NOTE — Progress Notes (Signed)
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 2012 p "bad cold"  more doe then   Acute onset cough summer 2013 p exposure to cleaning agents  persistent cough and sob with exertion > adls.  Cough is dry and mostly daytime and assoc with constant sensation of pnds. rec Pantoprazole (protonix) 40 mg   Take 30-60 min before first meal of the day and Pepcid 20 mg one bedtime until return to office - this is the best way to tell whether stomach acid is contributing to your problem.   GERD diet      04/03/2013 f/u ov/[name] re:  ?ILD  Chief Complaint  Patient presents with  . Follow-up    Pt states her SOB and cough are much improved since last visit. No new co's today.   rec Ok to zyrtec 10 mg one daily as needed for nasal drainage  Return 1st week in [place] 2015 for pft and cxr > did not return as requested     03/30/2014 f/u ov/[name] re: PF/ rash ? Could this be sarcoid?  Chief Complaint  Patient presents with  . Follow-up    Last seen 03/16/14. States feeling well. Denies SOB, wheezing, chest pain or tightness, or cough    in general sitting still RA s 02 88-90% at home per pt monitoring  Walking 02 2lpm x 5 trips on 02 to mailbox and back  Sleep on 2lpm  rec Ok to adjust your 02 for sats > 90% daytime but always use the 02 2lpm at bedtime Please remember to go to the lab  department downstairs for your tests - we will call you with the results when they are available. Prednisone 10 mg Take 4 for three days 3 for three days 2 for three  days 1 for three days and stop  Please see patient coordinator before you leave today  to schedule dermatology eval asap > dx was venous stasis dermatitis   04/27/2014 f/u ov/[name] re: pf/ chronic resp failure/ steroid responsive / not adjusting 02 up as rec  Chief Complaint  Patient presents with  . Follow-up    Pt states that her breathing was doing great until stopped pred 2 wks ago.  She was SOB walking from her car to our building today.   Only does well with breathing while on prednisone.  rec Prednisone 10 mg take  4 each am x 2 days,   2 each am x 2 days,  1 each am x 2 days and stop         05/25/2014 f/u ov/[name] re: steroid responsive PF/02 dep Resp failure / not using 02 as rec  Chief Complaint  Patient presents with  . Follow-up    Breathing has improved some. She is having some cough- prod with clear, thick sputum.   much better breathing /cough while on prednisone though really can't get a consistent response re doe on pred vs cough or on 02 vs off  Last called in 6 days of pred on 05/15/14   No obvious day to day or daytime variabilty or assoc    cp or chest tightness, subjective wheeze overt sinus or hb symptoms. No unusual exp hx or h/o childhood pna/ asthma or knowledge of premature birth.  Sleeping ok without nocturnal  or early am exacerbation  of respiratory  c/o's or need for noct saba. Also denies any obvious fluctuation of symptoms with weather or environmental changes or other aggravating or alleviating factors except as outlined above   Current Medications, Allergies, Complete Past Medical History, Past Surgical History, Family History, and Social History were reviewed in [facility] electronic medical record.  ROS  The following are not active complaints unless bolded sore throat, dysphagia, dental problems, itching, sneezing,  nasal congestion or excess/ purulent secretions, ear ache,   fever, chills, sweats, unintended wt loss, pleuritic or exertional cp,  hemoptysis,  orthopnea pnd or leg swelling better , presyncope, palpitations, heartburn, abdominal pain, anorexia, nausea, vomiting, diarrhea  or change in bowel or urinary habits, change in stools or urine, dysuria,hematuria,  Rash better , arthralgias, visual complaints, headache, numbness weakness or ataxia or problems with walking or coordination,  change in mood/affect or memory.                Objective:   Physical Exam     amb obese wf nad on 4lpm pulsed  04/03/2013        234 > 10/06/2013  231 > 03/02/2014   224> 03/16/2014  248 > 03/30/2014  228 > 04/27/2014   227 > 05/25/2014   222 Wt Readings from Last 3 Encounters:  01/11/13 238 lb 6.4 oz (108.138 kg)  12/13/12 246 lb (111.585 kg)  11/14/12 247 lb 12.8 oz (112.401 kg)           HEENT: nl dentition, turbinates, and orophanx. Nl external ear canals without cough reflex   NECK :  without JVD/Nodes/TM/ nl carotid upstrokes bilaterally   LUNGS: no acc muscle use, bibasilar insp crackles s cough    CV:  RRR  no s3 or murmur or increase in P2,  Trace sym pitting bilateral lower ext  edema   ABD:  soft and nontender with nl excursion in the supine position. No bruits or organomegaly, bowel sounds nl  MS:  warm without deformities, calf tenderness, cyanosis - mild/moderate clubbing  SKIN: warm and dry / several plaque like erythematous lesions LLE x sev cm diameter- no change from prior exam and new ? Venous stasis changes both lower extremeties         I personally reviewed images and agree with radiology impression as follows:  CXR:  10/06/13 Extensive pulmonary fibrosis without acute disease.      Assessment & Plan:

## 2014-05-25 NOTE — Patient Instructions (Signed)
Prednisone 10 mg daily - if getting worse double the dose until better, if getting better reduce the dose to 5 mg daily   Increase 02 to 4lpm with activity, reduce the dose down to 2lpm at rest and sleeping. > goal is keep above 90% at all times  Please schedule a follow up office visit in 6 weeks, call sooner if needed

## 2014-05-26 ENCOUNTER — Encounter: Payer: Self-pay | Admitting: Internal Medicine

## 2014-05-26 NOTE — Assessment & Plan Note (Signed)
-   12/13/2012  Walked RA  2 laps @ 185 ft each stopped due to desat 80% s sob at very rapid pace - 04/03/2013   Walked RA x one lap @ 185 stopped due to desat 81%  - 10/06/2013   Walked RA @ slow pace x 20 ft stopped due to sat 81%   >   02 titration done by [facility] 10/06/13 ok on 2lpm POC   - 03/02/2014   Walked  x one lap @ 185 stopped due to  desat to 83 on 3lpm pulsed but not sob/ nl pace - 03/30/2014   Walked 2lpmx one lap @ 185 stopped due to  sats down to 72 but no sob/ slow pace  - 03/30/14 HC03 33  - 05/26/2014   Walked 4 lpm pulsed x one lap @ 185 stopped due to  Sob and desat to 70% / moderate pace   As of 05/25/14  rec 2lpm at rest and titrate 02 with activity/ adjust  Flow with goal  sat > 90%  She insists her breathing is much better while on pred @ 10 mg per day or better so rather than aim for a moving target asked her to continue 02 4lpm pusled with activity and see if chronic steroids help her tolerance or 02 requirements   See instructions for specific recommendations which were reviewed directly with the patient who was given a copy with highlighter outlining the key components.

## 2014-05-26 NOTE — Assessment & Plan Note (Signed)
-  onset of symptoms Summer 2013  - Labs 02/15/12 ESR 39, Eso 6.3% - HRCT 11/17/12 1. Pulmonary parenchymal pattern of interstitial lung disease may be due to end-stage sarcoid or postinflammatory fibrosis. Chronic HSP is not excluded. Pattern is not consistent with usual interstitial pneumonitis (UIP). 2. Enlarged pulmonary arteries, indicative of pulmonary arterial Hypertension. - PFT's 12/13/2012 VC 1.16 (42%) and no obst and DLCO 42% corrects to  107  - started amb 02 10/07/2013 per [facility] (see ex hypoxemia) - ESR 03/30/14 = 84/ ACE level 53 with nl calcium and prot/alb - pred x 12 day course 03/30/14> improved symptoms transiently - skin bx 05/14/14:  C/w stasis dermatitis / no sarcoid  I had an extended discussion with the patient reviewing all relevant studies completed to date and  lasting 15 to 20 minutes of a 25 minute visit on the following ongoing concerns:   Steroid resp PF assoc with elevated ESR.  The goal with a chronic steroid dependent illness is always arriving at the lowest effective dose that controls the disease/symptoms and not accepting a set "formula" which is based on statistics or guidelines that don't always take into account patient  variability or the natural hx of the dz in every individual patient, which may well vary over time.  For now therefore I recommend the patient maintain  Ceiling of 20 mg and a floor of 5 mg daily   Offered second opinion/ [facility] eval/ declined  Each maintenance medication was reviewed in detail including most importantly the difference between maintenance and as needed and under what circumstances the prns are to be used.  Please see instructions for details which were reviewed in writing and the patient given a copy.

## 2014-05-30 ENCOUNTER — Other Ambulatory Visit: Payer: Self-pay | Admitting: Internal Medicine

## 2014-06-01 ENCOUNTER — Other Ambulatory Visit: Payer: Self-pay | Admitting: Internal Medicine

## 2014-06-04 ENCOUNTER — Other Ambulatory Visit: Payer: Self-pay | Admitting: Internal Medicine

## 2014-06-04 MED ORDER — FAMOTIDINE 20 MG PO TABS: 20.0000 mg | ORAL_TABLET | Freq: Every day | ORAL | Status: AC

## 2014-06-22 ENCOUNTER — Encounter: Payer: Self-pay | Admitting: Internal Medicine

## 2014-07-09 ENCOUNTER — Ambulatory Visit: Payer: Medicare Other | Admitting: Internal Medicine

## 2014-07-09 ENCOUNTER — Other Ambulatory Visit: Payer: Self-pay

## 2014-08-28 ENCOUNTER — Ambulatory Visit (INDEPENDENT_AMBULATORY_CARE_PROVIDER_SITE_OTHER): Payer: Medicare Other | Admitting: Internal Medicine

## 2014-08-28 ENCOUNTER — Encounter: Payer: Self-pay | Admitting: Internal Medicine

## 2014-08-28 VITALS — BP 132/68 | HR 91 | Ht 64.0 in | Wt 219.2 lb

## 2014-08-28 DIAGNOSIS — J9612 Chronic respiratory failure with hypercapnia: Secondary | ICD-10-CM | POA: Diagnosis not present

## 2014-08-28 DIAGNOSIS — J8410 Pulmonary fibrosis, unspecified: Secondary | ICD-10-CM | POA: Diagnosis not present

## 2014-08-28 NOTE — Progress Notes (Signed)
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 2012 p "bad cold"  more doe then   Acute onset cough summer 2013 p exposure to cleaning agents  persistent cough and sob with exertion > adls.  Cough is dry and mostly daytime and assoc with constant sensation of pnds. rec Pantoprazole (protonix) 40 mg   Take 30-60 min before first meal of the day and Pepcid 20 mg one bedtime until return to office - this is the best way to tell whether stomach acid is contributing to your problem.   GERD diet      04/03/2013 f/u ov/Anakaren Campion re:  ?ILD  Chief Complaint  Patient presents with  . Follow-up    Pt states her SOB and cough are much improved since last visit. No new co's today.   rec Ok to zyrtec 10 mg one daily as needed for nasal drainage  Return 1st week in [place] 2015 for pft and cxr > did not return as requested     03/30/2014 f/u ov/Cynthia Stainback re: PF/ rash ? Could this be sarcoid?  Chief Complaint  Patient presents with  . Follow-up    Last seen 03/16/14. States feeling well. Denies SOB, wheezing, chest pain or tightness, or cough    in general sitting still RA s 02 88-90% at home per pt monitoring  Walking 02 2lpm x 5 trips on 02 to mailbox and back  Sleep on 2lpm  rec Ok to adjust your 02 for sats > 90% daytime but always use the 02 2lpm at bedtime Please remember to go to the lab  department downstairs for your tests - we will call you with the results when they are available. Prednisone 10 mg Take  4 for three days 3 for three days 2 for three days 1 for three days and stop  Please see patient coordinator before you leave today  to schedule dermatology eval asap > dx was venous stasis dermatitis   04/27/2014 f/u ov/Makinlee Awwad re: pf/ chronic resp failure/ steroid responsive / not adjusting 02 up as rec  Chief Complaint  Patient presents with  . Follow-up    Pt states that her breathing was doing great until stopped pred 2 wks ago.  She was SOB walking from her car to our building today.   Only does well with breathing while on prednisone.  rec Prednisone 10 mg take  4 each am x 2 days,   2 each am x 2 days,  1 each am x 2 days and stop        05/25/2014 f/u ov/Roman Dubuc re: steroid responsive PF/02 dep Resp failure / not using 02 as rec  Chief Complaint  Patient presents with  . Follow-up    Breathing has improved some. She is having some cough- prod with clear, thick sputum.   much better breathing /cough while on prednisone though really can't get a consistent response re doe on pred vs  cough or on 02 vs off  Last called in 6 days of pred on 05/15/14  rec Prednisone 10 mg daily - if getting worse double the dose until better, if getting better reduce the dose to 5 mg daily  Increase 02 to 4lpm with activity, reduce the dose down to 2lpm at rest and sleeping. > goal is keep above 90% at all times   08/28/2014 f/u ov/Lavayah Vita re: pf with steroid dep component since 05/25/14 still on 20 mg daily  Chief Complaint  Patient presents with  . Follow-up    Pt states breathing has been better since last visit. Cough is almost resolved      No obvious day to day or daytime variabilty or assoc    cp or chest tightness, subjective wheeze overt sinus or hb symptoms. No unusual exp hx or h/o childhood pna/ asthma or knowledge of premature birth.  Sleeping ok without nocturnal  or early am exacerbation  of respiratory  c/o's or need for noct saba. Also denies any obvious fluctuation of symptoms with weather  or environmental changes or other aggravating or alleviating factors except as outlined above   Current Medications, Allergies, Complete Past Medical History, Past Surgical History, Family History, and Social History were reviewed in [employer] record.  ROS  The following are not active complaints unless bolded sore throat, dysphagia, dental problems, itching, sneezing,  nasal congestion or excess/ purulent secretions, ear ache,   fever, chills, sweats, unintended wt loss, pleuritic or exertional cp, hemoptysis,  orthopnea pnd or leg swelling better , presyncope, palpitations, heartburn, abdominal pain, anorexia, nausea, vomiting, diarrhea  or change in bowel or urinary habits, change in stools or urine, dysuria,hematuria,  Rash better , arthralgias, visual complaints, headache, numbness weakness or ataxia or problems with walking or coordination,  change in mood/affect or memory.                Objective:   Physical Exam    amb obese wf nad on 4lpm pulsed  04/03/2013        234 > 10/06/2013  231 > 03/02/2014   224> 03/16/2014  248 > 03/30/2014  228 > 04/27/2014   227 > 05/25/2014   222> 08/28/2014   219  Wt Readings from Last 3 Encounters:  01/11/13 238 lb 6.4 oz (108.138 kg)  12/13/12 246 lb (111.585 kg)  11/14/12 247 lb 12.8 oz (112.401 kg)           HEENT: nl dentition, turbinates, and orophanx. Nl external ear canals without cough reflex   NECK :  without JVD/Nodes/TM/ nl carotid upstrokes bilaterally   LUNGS: no acc muscle use, bibasilar insp crackles s cough    CV:  RRR  no s3 or murmur or increase in P2,  Trace sym pitting bilateral lower ext  edema   ABD:  soft and nontender with nl excursion in the supine position. No bruits or organomegaly, bowel sounds nl  MS:  warm without deformities, calf tenderness, cyanosis - mild/moderate clubbing  SKIN: warm and dry /  Venous stasis changes both lower extremities         I personally reviewed images  and agree with radiology impression as follows:  CXR:  10/06/13 Extensive pulmonary fibrosis without acute disease.   08/28/2014 labs ordered: esr but did not go as requested    Assessment & Plan:

## 2014-08-28 NOTE — Patient Instructions (Addendum)
Prednisone 10 mg daily - if getting worse double the dose until better, if getting better reduce the dose to 5 mg daily   Increase 02 to 4lpm with activity, reduce the dose down to 2lpm at rest and sleeping. > goal is keep above 90% at all times  Please see patient coordinator before you leave today  to schedule pulmonary rehab  Please remember to go to the lab  department downstairs for your tests - we will call you with the results when they are available.  Please schedule a follow up visit in 3 months but call sooner if needed  Late add did not go to lab as requested for esr       

## 2014-08-31 ENCOUNTER — Telehealth (HOSPITAL_COMMUNITY): Payer: Self-pay

## 2014-08-31 NOTE — Telephone Encounter (Signed)
Called patient regarding entrance to Pulmonary Rehab.  Patient states that they are interested in attending the program.  Miranda Hines is going to verify insurance coverage and follow up.

## 2014-09-01 ENCOUNTER — Encounter: Payer: Self-pay | Admitting: Internal Medicine

## 2014-09-01 NOTE — Assessment & Plan Note (Addendum)
-   12/13/2012  Walked RA  2 laps @ 185 ft each stopped due to desat 80% s sob at very rapid pace - 04/03/2013   Walked RA x one lap @ 185 stopped due to desat 81%  - 10/06/2013   Walked RA @ slow pace x 20 ft stopped due to sat 81%   >   02 [facility]ration [facility] by [facility] 10/06/13 ok on 2lpm POC   - 03/02/2014   Walked  x one lap @ 185 stopped due to  desat to 83 on 3lpm pulsed but not sob/ nl pace - 03/30/2014   Walked 2lpmx one lap @ 185 stopped due to  sats down to 72 but no sob/ slow pace  - 03/30/14 [facility] 33  - 05/26/2014   Walked 4 lpm pulsed x one lap @ 185 stopped due to  Sob and desat to 70% / moderate pace - 08/28/2014  Walked 4lpm  x 3 laps @ 185 ft each stopped due to Sob s desat at nl pace   As of 08/28/2014 :  Ok at rest @  2lpm  And also while sleeping and 4lpm with exertion with goal keeping > 90%

## 2014-09-01 NOTE — Assessment & Plan Note (Signed)
-  onset of symptoms Summer 2013  - Labs 02/15/12 ESR 39, Eso 6.3% - HRCT 11/17/12 1. Pulmonary parenchymal pattern of interstitial lung disease may be due to end-stage sarcoid or postinflammatory fibrosis. Chronic HSP is not excluded. Pattern is not consistent with usual interstitial pneumonitis (UIP). 2. Enlarged pulmonary arteries, indicative of pulmonary arterial Hypertension. - PFT's 12/13/2012 VC 1.16 (42%) and no obst and DLCO 42% corrects to  107  - started amb 02 10/07/2013 per [facility] (see ex hypoxemia) - ESR 03/30/14 = 84/ ACE level 53 with nl calcium and prot/alb - pred x 12 day course 03/30/14> improved symptoms transiently - skin bx 05/14/14:  C/w stasis dermatitis / no sarcoid - Prednisone maint rx (20 ceiling/ 5 mg floor) 05/25/2014 >>>  The goal with a chronic steroid dependent illness is always arriving at the lowest effective dose that controls the disease/symptoms and not accepting a set "formula" which is based on statistics or guidelines that don't always take into account patient  variability or the natural hx of the dz in every individual patient, which may well vary over time.  For now therefore I recommend the patient maintain  20 celing and 5 mg floor.  I had an extended discussion with the patient reviewing all relevant studies completed to date and  lasting 15 to 20 minutes of a 25 minute visit    Each maintenance medication was reviewed in detail including most importantly the difference between maintenance and prns and under what circumstances the prns are to be triggered using an action plan format that is not reflected in the computer generated alphabetically organized AVS.    Please see instructions for details which were reviewed in writing and the patient given a copy highlighting the part that I personally wrote and discussed at today's ov.

## 2014-09-03 ENCOUNTER — Telehealth (HOSPITAL_COMMUNITY): Payer: Self-pay

## 2014-09-03 NOTE — Telephone Encounter (Signed)
Patient states she has checked her insurance and has a 50.00 copay/visit for pulmonary rehab. She can't afford copay. Interested in maintenance. Will follow up.

## 2014-09-10 ENCOUNTER — Telehealth: Payer: Self-pay | Admitting: Internal Medicine

## 2014-09-10 NOTE — Telephone Encounter (Signed)
[name] does our CMN's. Please advise thanks

## 2014-09-11 NOTE — Telephone Encounter (Signed)
The CMN is in Dr. Thurston Hole folder which I will give to him now

## 2014-09-26 ENCOUNTER — Telehealth: Payer: Self-pay | Admitting: Internal Medicine

## 2014-09-26 NOTE — Telephone Encounter (Signed)
[facility] faxed over CMN for POC. [name], do you have this?

## 2014-09-27 NOTE — Telephone Encounter (Signed)
Called Miranda Hines to have this re faxed. Will await fax

## 2014-09-27 NOTE — Telephone Encounter (Signed)
It is not in Dr. Thurston Hole folder right now. He signed everything on Friday and I faxed what I had. I called Medical Records to get them to check there was nothing there unless it has been sent to the [facility]

## 2014-09-28 NOTE — Telephone Encounter (Signed)
Miranda Hines is suppose to re fax form. [name], did you get this?

## 2014-09-28 NOTE — Telephone Encounter (Signed)
Miranda Hines - have you received these forms? Please advise.

## 2014-09-28 NOTE — Telephone Encounter (Signed)
Have not seen this, sorry

## 2014-09-28 NOTE — Telephone Encounter (Signed)
The form is in Dr. Thurston Hole CMN folder along with all the others that he needs to sign

## 2014-10-08 ENCOUNTER — Encounter (HOSPITAL_COMMUNITY)
Admission: RE | Admit: 2014-10-08 | Discharge: 2014-10-08 | Disposition: A | Discharge status: Home / Self Care | Payer: Medicare Other | Source: Ambulatory Visit | Attending: Internal Medicine | Admitting: Internal Medicine

## 2014-10-08 ENCOUNTER — Encounter (HOSPITAL_COMMUNITY): Payer: Self-pay

## 2014-10-08 VITALS — BP 141/71 | HR 85 | Resp 18 | Ht 61.5 in | Wt 219.6 lb

## 2014-10-08 DIAGNOSIS — J849 Interstitial pulmonary disease, unspecified: Secondary | ICD-10-CM | POA: Insufficient documentation

## 2014-10-08 NOTE — Progress Notes (Signed)
Miranda Hines 74 y.o. female Pulmonary Rehab Orientation Note Patient arrived today in [facility] for orientation to Pulmonary Rehab. She ambulated from the [facility] entrance where her son dropped her off. She did experience moderate shortness of breath during ambulation. She does carry portable oxygen. Per pt, she uses oxygen continuously; 2 liters pulse at rest and 4 liters pulse with activity. Color good, skin warm and dry. Digital clubbing present. Patient is oriented to time and place. Patient's medical history and medications reviewed. Heart rate is normal, S1S2 present. Breath sounds course,  wheezing with forced expiration heard diffusely throughout both lungs. Fine crackles noted in bases bilat. Grip strength equal, strong. Distal pulses palpable. Mild pitting edema noted to feet, ankles, and lower legs. Patient reports she does take medications as prescribed. Patient states she follows a Regular diet. She states she has had unintentional weight loss in the last several months. She attributes this to a lack of appetite even thought she is on steroids. Patient's weight will be monitored closely and patient will discuss nutrition with RD while in the program. Demonstration and practice of PLB using pulse oximeter. Patient able to return demonstration satisfactorily. Safety and hand hygiene in the exercise area reviewed with patient. Patient voices understanding of the information reviewed. Department expectations discussed with patient and achievable goals were set. The patient shows enthusiasm about attending the program and we look forward to working with this nice gentleman. The patient is scheduled for a 6 min walk test on Thursday 9/29 at 3:30 and to begin exercise on Tuesday 10/4 in the 1:30 class.   45 minutes was spent on a variety of activities such as assessment of the patient, obtaining baseline data including height, weight, BMI, and grip strength, verifying medical  history, allergies, and current medications, and teaching patient strategies for performing tasks with less respiratory effort with emphasis on pursed lip breathing.

## 2014-10-11 ENCOUNTER — Encounter (HOSPITAL_COMMUNITY)
Admission: RE | Admit: 2014-10-11 | Discharge: 2014-10-11 | Disposition: A | Discharge status: Home / Self Care | Payer: Medicare Other | Source: Ambulatory Visit | Attending: Internal Medicine | Admitting: Internal Medicine

## 2014-10-11 DIAGNOSIS — J849 Interstitial pulmonary disease, unspecified: Secondary | ICD-10-CM | POA: Diagnosis not present

## 2014-10-11 NOTE — Progress Notes (Signed)
Miranda Hines completed a Six-Minute Walk Test on 10/11/14 . Miranda Hines walked 616 feet with 2 breaks lasting 2 minutes.  The patient's lowest oxygen saturation was 78 %, highest heart rate was 120 bpm , and highest blood pressure was 150/62. The patient was monitored using a forehead probe. Patient was on 2 liters to start off with and titrated to 10 liters of oxygen with a nasal cannula. Highest oxygen saturation achieved on 10 liters was 87% at the 4th minute. O2 sat dropped to 80% 10 liters at the 5th and 6th minute. This was during a rest break. Patient stated that nothing hindered their walk test.

## 2014-10-16 ENCOUNTER — Other Ambulatory Visit: Payer: Self-pay | Admitting: Internal Medicine

## 2014-10-16 ENCOUNTER — Encounter (HOSPITAL_COMMUNITY)
Admission: RE | Admit: 2014-10-16 | Discharge: 2014-10-16 | Disposition: A | Discharge status: Home / Self Care | Payer: Medicare Other | Source: Ambulatory Visit | Attending: Internal Medicine | Admitting: Internal Medicine

## 2014-10-16 DIAGNOSIS — J849 Interstitial pulmonary disease, unspecified: Secondary | ICD-10-CM | POA: Insufficient documentation

## 2014-10-16 NOTE — Progress Notes (Signed)
Today, Miranda Hines exercised at [facility]. [facility]. Service time was from 1330 to 1510.  The patient exercised by performing aerobic, strengthening, and stretching exercises. Oxygen saturation, heart rate, blood pressure, rate of perceived exertion, and shortness of breath were all monitored before, during, and after exercise. Miranda Hines presented with no problems at today's exercise session.  The patient did not have an increase in workload intensity during today's exercise session.  Pre-exercise vitals: . Weight kg: 100.9 . Liters of O2: 10L . SpO2: 94 . HR: 95 . BP: 126/64 . CBG: na  Exercise vitals: . Highest heartrate:  108 . Lowest oxygen saturation: 79 increased to 92 with rest break and plb . Highest blood pressure: 160/66 . Liters of 02: 10L  Post-exercise vitals: . SpO2: 100 . HR: 89 . BP: 104/64 . Liters of O2: 6L . CBG: na  Dr. Kalman Shan, Medical Director Dr. Susie Cassette is immediately available during today's Pulmonary Rehab session for Miranda Hines on 10/16/2014 at 1330 class time

## 2014-10-18 ENCOUNTER — Telehealth: Payer: Self-pay | Admitting: Internal Medicine

## 2014-10-18 ENCOUNTER — Encounter (HOSPITAL_COMMUNITY): Payer: Medicare Other

## 2014-10-18 DIAGNOSIS — J8410 Pulmonary fibrosis, unspecified: Secondary | ICD-10-CM

## 2014-10-18 NOTE — Telephone Encounter (Signed)
lmtcb x1 for pt. 

## 2014-10-18 NOTE — Telephone Encounter (Signed)
Spoke with Kirt Boys at Pulmonary Rehab. States pt needs oxymizer pendant. Advised her that we have some here at the office she can come pick up. [facility] form has been filled out and order has been printed. Will await for pt to come pick this up.

## 2014-10-18 NOTE — Telephone Encounter (Signed)
Called spoke with patient and confirmed that [facility] is requiring her to pay $500 out of pocket before she can be seen.  She is unable to afford this.  Pt is wondering if MW would like to try another pulmonologist for 2nd opinion.  MW please advise, thanks.

## 2014-10-18 NOTE — Telephone Encounter (Signed)
I could not fine my rec to go to [facility] in the EMR but if she wants another opinion here she should be seen by [facility] as a new consult

## 2014-10-19 NOTE — Telephone Encounter (Signed)
Called and spoke to pt. Pt requesting the appt with MR. Appt made with MR on 11.15.16. Pt verbalized understanding and denied any further questions or concerns at this time.

## 2014-10-20 ENCOUNTER — Encounter (HOSPITAL_COMMUNITY): Payer: Self-pay | Admitting: Nurse Practitioner

## 2014-10-20 ENCOUNTER — Inpatient Hospital Stay (HOSPITAL_COMMUNITY)
Admission: EM | Admit: 2014-10-20 | Discharge: 2014-10-23 | DRG: 196 | Disposition: A | Discharge status: Home / Self Care | Payer: Medicare Other | Attending: Internal Medicine | Admitting: Internal Medicine

## 2014-10-20 ENCOUNTER — Emergency Department (HOSPITAL_COMMUNITY): Payer: Medicare Other

## 2014-10-20 DIAGNOSIS — R0989 Other specified symptoms and signs involving the circulatory and respiratory systems: Secondary | ICD-10-CM | POA: Diagnosis not present

## 2014-10-20 DIAGNOSIS — I5033 Acute on chronic diastolic (congestive) heart failure: Secondary | ICD-10-CM | POA: Diagnosis present

## 2014-10-20 DIAGNOSIS — J8410 Pulmonary fibrosis, unspecified: Secondary | ICD-10-CM | POA: Diagnosis present

## 2014-10-20 DIAGNOSIS — Z79899 Other long term (current) drug therapy: Secondary | ICD-10-CM | POA: Diagnosis not present

## 2014-10-20 DIAGNOSIS — K219 Gastro-esophageal reflux disease without esophagitis: Secondary | ICD-10-CM | POA: Diagnosis not present

## 2014-10-20 DIAGNOSIS — Z9109 Other allergy status, other than to drugs and biological substances: Secondary | ICD-10-CM

## 2014-10-20 DIAGNOSIS — R0600 Dyspnea, unspecified: Secondary | ICD-10-CM | POA: Diagnosis not present

## 2014-10-20 DIAGNOSIS — J84112 Idiopathic pulmonary fibrosis: Principal | ICD-10-CM | POA: Diagnosis present

## 2014-10-20 DIAGNOSIS — R0902 Hypoxemia: Secondary | ICD-10-CM | POA: Diagnosis not present

## 2014-10-20 DIAGNOSIS — J9621 Acute and chronic respiratory failure with hypoxia: Secondary | ICD-10-CM | POA: Diagnosis not present

## 2014-10-20 DIAGNOSIS — Z7982 Long term (current) use of aspirin: Secondary | ICD-10-CM | POA: Diagnosis not present

## 2014-10-20 LAB — URINALYSIS, ROUTINE W REFLEX MICROSCOPIC
BILIRUBIN URINE: NEGATIVE
GLUCOSE, UA: NEGATIVE mg/dL
Hgb urine dipstick: NEGATIVE
KETONES UR: NEGATIVE mg/dL
LEUKOCYTES UA: NEGATIVE
Nitrite: NEGATIVE
PH: 8 (ref 5.0–8.0)
Protein, ur: NEGATIVE mg/dL
Specific Gravity, Urine: 1.006 (ref 1.005–1.030)
Urobilinogen, UA: 1 mg/dL (ref 0.0–1.0)

## 2014-10-20 LAB — BASIC METABOLIC PANEL
Anion gap: 7 (ref 5–15)
BUN: 13 mg/dL (ref 6–20)
CHLORIDE: 98 mmol/L — AB (ref 101–111)
CO2: 32 mmol/L (ref 22–32)
CREATININE: 0.61 mg/dL (ref 0.44–1.00)
Calcium: 8.6 mg/dL — ABNORMAL LOW (ref 8.9–10.3)
GFR calc Af Amer: >60 mL/min (ref >60)
GFR calc non Af Amer: >60 mL/min (ref >60)
Glucose, Bld: 112 mg/dL — ABNORMAL HIGH (ref 65–99)
Potassium: 3.9 mmol/L (ref 3.5–5.1)
Sodium: 137 mmol/L (ref 135–145)

## 2014-10-20 LAB — CBC
HCT: 34.8 % — ABNORMAL LOW (ref 36.0–46.0)
Hemoglobin: 11.4 g/dL — ABNORMAL LOW (ref 12.0–15.0)
MCH: 33.8 pg (ref 26.0–34.0)
MCHC: 32.8 g/dL (ref 30.0–36.0)
MCV: 103.3 fL — AB (ref 78.0–100.0)
PLATELETS: 222 10*3/uL (ref 150–400)
RBC: 3.37 MIL/uL — ABNORMAL LOW (ref 3.87–5.11)
RDW: 14.6 % (ref 11.5–15.5)
WBC: 7 10*3/uL (ref 4.0–10.5)

## 2014-10-20 LAB — I-STAT TROPONIN, ED: Troponin i, poc: 0 ng/mL (ref 0.00–0.08)

## 2014-10-20 LAB — BRAIN NATRIURETIC PEPTIDE: B NATRIURETIC PEPTIDE 5: 88.5 pg/mL (ref 0.0–100.0)

## 2014-10-20 MED ORDER — METHYLPREDNISOLONE SODIUM SUCC 40 MG IJ SOLR
40.0000 mg | Freq: Four times a day (QID) | INTRAMUSCULAR | Status: DC
  Administered 2014-10-20 – 2014-10-21 (×4): 40 mg via INTRAVENOUS
  Filled 2014-10-20 (×4): qty 1

## 2014-10-20 MED ORDER — PANTOPRAZOLE SODIUM 40 MG PO TBEC
40.0000 mg | DELAYED_RELEASE_TABLET | Freq: Every day | ORAL | Status: DC
  Administered 2014-10-21 – 2014-10-23 (×3): 40 mg via ORAL
  Filled 2014-10-20 (×3): qty 1

## 2014-10-20 MED ORDER — HEPARIN SODIUM (PORCINE) 5000 UNIT/ML IJ SOLN
5000.0000 [IU] | Freq: Three times a day (TID) | INTRAMUSCULAR | Status: DC
  Administered 2014-10-20 – 2014-10-23 (×8): 5000 [IU] via SUBCUTANEOUS
  Filled 2014-10-20 (×8): qty 1

## 2014-10-20 MED ORDER — FAMOTIDINE 20 MG PO TABS
20.0000 mg | ORAL_TABLET | Freq: Every day | ORAL | Status: DC
  Administered 2014-10-20 – 2014-10-22 (×3): 20 mg via ORAL
  Filled 2014-10-20 (×3): qty 1

## 2014-10-20 MED ORDER — ASPIRIN 81 MG PO TABS: 81.0000 mg | ORAL_TABLET | Freq: Every day | ORAL | Status: DC

## 2014-10-20 MED ORDER — GUAIFENESIN ER 600 MG PO TB12
1200.0000 mg | ORAL_TABLET | ORAL | Status: DC | PRN
  Administered 2014-10-23: 1200 mg via ORAL
  Filled 2014-10-20: qty 2

## 2014-10-20 MED ORDER — MONTELUKAST SODIUM 10 MG PO TABS
10.0000 mg | ORAL_TABLET | Freq: Every day | ORAL | Status: DC
  Administered 2014-10-20: 10 mg via ORAL
  Filled 2014-10-20: qty 1

## 2014-10-20 MED ORDER — IOHEXOL 350 MG/ML SOLN
100.0000 mL | Freq: Once | INTRAVENOUS | Status: AC | PRN
  Administered 2014-10-20: 100 mL via INTRAVENOUS

## 2014-10-20 MED ORDER — ASPIRIN 81 MG PO CHEW
81.0000 mg | CHEWABLE_TABLET | Freq: Every day | ORAL | Status: DC
  Administered 2014-10-21 – 2014-10-23 (×3): 81 mg via ORAL
  Filled 2014-10-20 (×3): qty 1

## 2014-10-20 NOTE — ED Notes (Signed)
She reports fatigue, some tightness in chest, and low O2 sats this week. She has had to increase her oxygen from [facility] to 3L [place]. She has pulmonary fibrosis. Denies SOB. When she walking today her o2 sats dropped to 66% on 2Lnc

## 2014-10-20 NOTE — H&P (Addendum)
[facility] History and Physical  Miranda Hines ZOX:[id number] DOB: Apr 06, 1940 DOA: 10/20/2014  Referring physician: EDP PCP: Cain Saupe, MD   Chief Complaint: SOB   HPI: Miranda Hines is a 74 y.o. female with h/o interstitial pulmonary fibrosis.  Patient presents to the ED with worsening SOB and hypoxia.  Normally 2L O2 at rest and 4L while ambulating.  For past week has had increasing SOB especially with exertion.  She has had to increase her home O2 to meet demand.  She has no new cough, fevers, chills.  No h/o DVT/PE.  Review of Systems: Systems reviewed.  As above, otherwise negative  Past Medical History  Diagnosis Date  . Interstitial lung disease (HCC)   . IPF (idiopathic pulmonary fibrosis) (HCC)    Past Surgical History  Procedure Laterality Date  . Appendectomy    . Tubal ligation     Social History:  reports that she has been passively smoking.  She does not have any smokeless tobacco history on file. She reports that she does not drink alcohol or use illicit drugs.  Allergies  Allergen Reactions  . Penicillins Swelling    Family History  Problem Relation Age of Onset  . Kidney failure Mother   . COPD Mother   . COPD Brother      Prior to Admission medications   Medication Sig Start Date End Date Taking? Authorizing Provider  aspirin 81 MG tablet Take 81 mg by mouth daily.   Yes Historical Provider, MD  famotidine (PEPCID) 20 MG tablet Take 1 tablet (20 mg total) by mouth at bedtime. 06/04/14  Yes Nyoka Cowden, MD  guaiFENesin (MUCINEX) 600 MG 12 hr tablet Take 1,200 mg by mouth as needed for congestion.   Yes Historical Provider, MD  montelukast (SINGULAIR) 10 MG tablet take 1 tablet by mouth at bedtime 06/01/14  Yes Nyoka Cowden, MD  pantoprazole (PROTONIX) 40 MG tablet TAKE 1 TABLET BY MOUTH 30 TO 60 MINUTES BEFORE FIRST MEAL OF THE DAY 10/10/13  Yes Nyoka Cowden, MD  phenylephrine (SUDAFED PE) 10 MG TABS tablet Take 10 mg by mouth  every 4 (four) hours as needed.   Yes Historical Provider, MD  predniSONE (DELTASONE) 10 MG tablet TAKE 2 TABLETS BY MOUTH DAILY Patient taking differently: TAKE 1.5 TABLETS BY MOUTH DAILY 10/16/14  Yes Nyoka Cowden, MD  Saline (SIMPLY SALINE) 0.9 % AERS Place 1 spray into the nose daily as needed (allergies).    Yes Historical Provider, MD   Physical Exam: Filed Vitals:   10/20/14 1836  BP: 111/65  Pulse: 71  Resp: 31    BP 111/65 mmHg  Pulse 71  Resp 31  SpO2 98%  General Appearance:    Alert, oriented, no distress, appears stated age  Head:    Normocephalic, atraumatic  Eyes:    PERRL, EOMI, sclera non-icteric        Nose:   Nares without drainage or epistaxis. Mucosa, turbinates normal  Throat:   Moist mucous membranes. Oropharynx without erythema or exudate.  Neck:   Supple. No carotid bruits.  No thyromegaly.  No lymphadenopathy.   Back:     No CVA tenderness, no spinal tenderness  Lungs:     Bilateral crackles.  Chest wall:    No tenderness to palpitation  Heart:    Regular rate and rhythm without murmurs, gallops, rubs  Abdomen:     Soft, non-tender, nondistended, normal bowel sounds, no organomegaly  Genitalia:  deferred  Rectal:    deferred  Extremities:   No clubbing, cyanosis or edema.  Pulses:   2+ and symmetric all extremities  Skin:   Skin color, texture, turgor normal, no rashes or lesions  Lymph nodes:   Cervical, supraclavicular, and axillary nodes normal  Neurologic:   CNII-XII intact. Normal strength, sensation and reflexes      throughout    Labs on Admission:  Basic Metabolic Panel:  Recent Labs Lab 10/20/14 1612  NA 137  K 3.9  CL 98*  CO2 32  GLUCOSE 112*  BUN 13  CREATININE 0.61  CALCIUM 8.6*   Liver Function Tests: No results for input(s): AST, ALT, ALKPHOS, BILITOT, PROT, ALBUMIN in the last 168 hours. No results for input(s): LIPASE, AMYLASE in the last 168 hours. No results for input(s): AMMONIA in the last 168  hours. CBC:  Recent Labs Lab 10/20/14 1612  WBC 7.0  HGB 11.4*  HCT 34.8*  MCV 103.3*  PLT 222   Cardiac Enzymes: No results for input(s): CKTOTAL, CKMB, CKMBINDEX, TROPONINI in the last 168 hours.  BNP (last 3 results)  Recent Labs  03/30/14 1705  PROBNP 132.0*   CBG: No results for input(s): GLUCAP in the last 168 hours.  Radiological Exams on Admission: Dg Chest 2 View  10/20/2014   CLINICAL DATA:  Short of breath, 2 days duration. Chronic lung disease. Low oxygen saturation.  EXAM: CHEST  2 VIEW  COMPARISON:  10/06/2013  FINDINGS: Heart size is unchanged. Atherosclerosis of the aorta again seen. Widespread pulmonary fibrotic pattern is unchanged from the study 10/06/2013. No area of dense consolidation or collapse. No effusion. Certainly, acute inflammatory change could be hidden amongst the extensive chronic fibrotic changes.  IMPRESSION: No change. Chronic and advanced widespread pulmonary fibrosis. No sign of acute infiltrate or collapse. However, due to the extensive fibrotic changes, acute inflammation could be hidden.   Electronically Signed   By: Paulina Fusi M.D.   On: 10/20/2014 17:39   Ct Angio Chest Pe W/cm &/or Wo Cm  10/20/2014   CLINICAL DATA:  Patient with interstitial pulmonary fibrosis with chronic oxygen dependence, recently increased shortness of breath. Worsened hypoxia for the last 4-5 days.  EXAM: CT ANGIOGRAPHY CHEST WITH CONTRAST  TECHNIQUE: Multidetector CT imaging of the chest was performed using the standard protocol during bolus administration of intravenous contrast. Multiplanar CT image reconstructions and MIPs were obtained to evaluate the vascular anatomy.  CONTRAST:  OMNIPAQUE IOHEXOL 350 MG/ML SOLN  COMPARISON:  Chest radiograph dated 10/20/2014  FINDINGS: There is no evidence of pulmonary embolus.  There is no aortic dissection. The aorta is normal in caliber. The main pulmonary artery measures 4.3 cm and is larger than the ascending aorta,  suggestive of pulmonary arterial hypertension.  There is no evidence of mediastinal lymphadenopathy. No axillary lymphadenopathy is seen. The visualized portions of the thyroid gland are unremarkable in appearance.  There are patchy widespread bilateral interstitial changes, with volume loss and bronchiectasis. Mosaic attenuation of the lung parenchyma is also seen. No evidence of pleural effusions. No evidence of pneumothorax.  The visualized portions of the liver and spleen are unremarkable.  The visualized portions of the pancreas, gallbladder, stomach, adrenal glands are within normal limits. There are bilateral nonobstructing renal calculi. There is a 2 cm exophytic simple appearing cyst off of the midpole of the left kidney.  No acute osseous abnormalities are seen.  Review of the MIP images confirms the above findings.  IMPRESSION: Extensive  bilateral interstitial fibrotic changes with bronchiectasis and volume loss, consistent with the given history of interstitial pulmonary fibrosis. Mosaic attenuation of the lung parenchyma is suggestive of an element of superimposed obstructive pulmonary disease.  Marked enlargement of the main pulmonary artery, which is suggestive of pulmonary arterial hypertension.  Bilateral nonobstructing renal calculi.  Simple appearing left renal cyst.   Electronically Signed   By: Ted Mcalpine M.D.   On: 10/20/2014 19:43    EKG: Independently reviewed.  Assessment/Plan Active Problems:   Interstitial pulmonary fibrosis (HCC)   1. Interstitial pulmonary fibrosis - (not idiopathic type) 1. Spoke with Dr. Wyatt Portela on phone: 1. Pulmonology to consult on patient tomorrow 2. Putting patient on solumedrol 40 iv Q6H for now since this has been steroid responsive in past 2. 2d echo to further evaluate the suspected / likely pulmonary HTN associated with this and suspect on CT 3. O2 via [place]    Code Status: Full Code  Family Communication: Family at  bedside Disposition Plan: Admit to inpatient   Time spent: 70 min  GARDNER, JARED M. [facility] Pager [phone]  If 7AM-7PM, please contact the day team taking care of the patient [email or web address] Password [email or web address] 10/20/2014, 8:35 PM

## 2014-10-20 NOTE — ED Provider Notes (Signed)
CSN: [id number]     Arrival date & time 10/20/14  1543 History   First MD Initiated Contact with Patient 10/20/14 1618     Chief Complaint  Patient presents with  . Fatigue     (Consider location/radiation/quality/duration/timing/severity/associated sxs/prior Treatment) HPI  Miranda Hines is a(n) 74 y.o. female who presents to the emergency department with chief complaint of shortness of breath and hypoxia. She is a past medical history of idiopathic pulmonary fibrosis. She is a chronic oxygen dependence. Normally she uses 2 L of pulse oxygen at rest and 4 L when ambulating. For the past week she has had some increasing shortness of breath. She noticed when walking across the hall from her bedroom to her kitchen today that she was severely short of breath and her oxygen saturation dropped to 66%. She has had to increase her oxygen in order to meet her new command. She denies history of heart failure, she denies new cough, fevers, chills, abnormal peripheral edema. She does have chronic peripheral edema, which is normally worse on the left. She feels this is unchanged. She wears compression hose. She denies any use of exogenous estrogens, smoking history, recent injury, confinement, surgery, or history of DVT or PE.  Patient has noticed that her respiratory rate is higher. She does not feel any chest pain. She has some mild chest tightness Past Medical History  Diagnosis Date  . Interstitial lung disease (HCC)   . IPF (idiopathic pulmonary fibrosis) (HCC)    Past Surgical History  Procedure Laterality Date  . Appendectomy    . Tubal ligation     Family History  Problem Relation Age of Onset  . Kidney failure Mother   . COPD Mother   . COPD Brother    Social History  Substance Use Topics  . Smoking status: Passive Smoke Exposure - Never Smoker  . Smokeless tobacco: None  . Alcohol Use: No   OB History    No data available     Review of Systems  Ten systems reviewed and  are negative for acute change, except as noted in the HPI.    Allergies  Penicillins  Home Medications   Prior to Admission medications   Medication Sig Start Date End Date Taking? Authorizing Provider  aspirin 81 MG tablet Take 81 mg by mouth daily.   Yes Historical Provider, MD  famotidine (PEPCID) 20 MG tablet Take 1 tablet (20 mg total) by mouth at bedtime. 06/04/14  Yes Nyoka Cowden, MD  guaiFENesin (MUCINEX) 600 MG 12 hr tablet Take 1,200 mg by mouth as needed for congestion.   Yes Historical Provider, MD  montelukast (SINGULAIR) 10 MG tablet take 1 tablet by mouth at bedtime 06/01/14  Yes Nyoka Cowden, MD  pantoprazole (PROTONIX) 40 MG tablet TAKE 1 TABLET BY MOUTH 30 TO 60 MINUTES BEFORE FIRST MEAL OF THE DAY 10/10/13  Yes Nyoka Cowden, MD  phenylephrine (SUDAFED PE) 10 MG TABS tablet Take 10 mg by mouth every 4 (four) hours as needed.   Yes Historical Provider, MD  predniSONE (DELTASONE) 10 MG tablet TAKE 2 TABLETS BY MOUTH DAILY Patient taking differently: TAKE 1.5 TABLETS BY MOUTH DAILY 10/16/14  Yes Nyoka Cowden, MD  Saline (SIMPLY SALINE) 0.9 % AERS Place 1 spray into the nose daily as needed (allergies).    Yes Historical Provider, MD   BP 116/51 mmHg  Pulse 66  Temp(Src) 97.7 F (36.5 C) (Oral)  Resp 18  Ht 5' 1.5" (1.562  m)  Wt 222 lb 0.1 oz (100.7 kg)  BMI 41.27 kg/m2  SpO2 97% Physical Exam  Constitutional: She is oriented to person, place, and time. She appears well-developed and well-nourished. No distress.  HENT:  Head: Normocephalic and atraumatic.  Eyes: Conjunctivae are normal. No scleral icterus.  Neck: Normal range of motion.  Cardiovascular: Normal rate, regular rhythm, normal heart sounds and intact distal pulses.  Exam reveals no gallop and no friction rub.   No murmur heard. Bilateral peripheral pitting edema, worse on the left  Pulmonary/Chest: No respiratory distress. She has rales.  Diffuse fine crackles, shallow effort, dry cough   Abdominal: Soft. Bowel sounds are normal. She exhibits no distension and no mass. There is no tenderness. There is no guarding.  Neurological: She is alert and oriented to person, place, and time.  Skin: Skin is warm and dry. She is not diaphoretic.  Nursing note and vitals reviewed.   ED Course  Procedures (including critical care time) Labs Review Labs Reviewed  BASIC METABOLIC PANEL - Abnormal; Notable for the following:    Chloride 98 (*)    Glucose, Bld 112 (*)    Calcium 8.6 (*)    All other components within normal limits  CBC - Abnormal; Notable for the following:    RBC 3.37 (*)    Hemoglobin 11.4 (*)    HCT 34.8 (*)    MCV 103.3 (*)    All other components within normal limits  URINALYSIS, ROUTINE W REFLEX MICROSCOPIC (NOT AT [facility]) - Abnormal; Notable for the following:    APPearance CLOUDY (*)    All other components within normal limits  SEDIMENTATION RATE - Abnormal; Notable for the following:    Sed Rate 58 (*)    All other components within normal limits  CBC WITH DIFFERENTIAL/PLATELET - Abnormal; Notable for the following:    RBC 3.42 (*)    Hemoglobin 11.4 (*)    HCT 34.8 (*)    MCV 101.8 (*)    Lymphs Abs 0.6 (*)    All other components within normal limits  C-REACTIVE PROTEIN - Abnormal; Notable for the following:    CRP 1.3 (*)    All other components within normal limits  BASIC METABOLIC PANEL - Abnormal; Notable for the following:    Chloride 97 (*)    CO2 36 (*)    Glucose, Bld 116 (*)    All other components within normal limits  PHOSPHORUS - Abnormal; Notable for the following:    Phosphorus 5.1 (*)    All other components within normal limits  BRAIN NATRIURETIC PEPTIDE  MAGNESIUM  ANA, BODY FLUID  ANTI-DNA ANTIBODY, DOUBLE-STRANDED  C3 COMPLEMENT  C4 COMPLEMENT  COMPLEMENT, TOTAL  ANCA TITERS  MPO/PR-3 (ANCA) ANTIBODIES  RHEUMATOID FACTOR  HYPERSENSITIVITY PNEUMONITIS  I-STAT TROPOININ, ED  Rosezena Sensor, ED    Imaging  Review Dg Chest 2 View  10/20/2014   CLINICAL DATA:  Short of breath, 2 days duration. Chronic lung disease. Low oxygen saturation.  EXAM: CHEST  2 VIEW  COMPARISON:  10/06/2013  FINDINGS: Heart size is unchanged. Atherosclerosis of the aorta again seen. Widespread pulmonary fibrotic pattern is unchanged from the study 10/06/2013. No area of dense consolidation or collapse. No effusion. Certainly, acute inflammatory change could be hidden amongst the extensive chronic fibrotic changes.  IMPRESSION: No change. Chronic and advanced widespread pulmonary fibrosis. No sign of acute infiltrate or collapse. However, due to the extensive fibrotic changes, acute inflammation could be hidden.  Electronically Signed   By: Paulina Fusi M.D.   On: 10/20/2014 17:39   Ct Angio Chest Pe W/cm &/or Wo Cm  10/20/2014   CLINICAL DATA:  Patient with interstitial pulmonary fibrosis with chronic oxygen dependence, recently increased shortness of breath. Worsened hypoxia for the last 4-5 days.  EXAM: CT ANGIOGRAPHY CHEST WITH CONTRAST  TECHNIQUE: Multidetector CT imaging of the chest was performed using the standard protocol during bolus administration of intravenous contrast. Multiplanar CT image reconstructions and MIPs were obtained to evaluate the vascular anatomy.  CONTRAST:  OMNIPAQUE IOHEXOL 350 MG/ML SOLN  COMPARISON:  Chest radiograph dated 10/20/2014  FINDINGS: There is no evidence of pulmonary embolus.  There is no aortic dissection. The aorta is normal in caliber. The main pulmonary artery measures 4.3 cm and is larger than the ascending aorta, suggestive of pulmonary arterial hypertension.  There is no evidence of mediastinal lymphadenopathy. No axillary lymphadenopathy is seen. The visualized portions of the thyroid gland are unremarkable in appearance.  There are patchy widespread bilateral interstitial changes, with volume loss and bronchiectasis. Mosaic attenuation of the lung parenchyma is also seen. No  evidence of pleural effusions. No evidence of pneumothorax.  The visualized portions of the liver and spleen are unremarkable.  The visualized portions of the pancreas, gallbladder, stomach, adrenal glands are within normal limits. There are bilateral nonobstructing renal calculi. There is a 2 cm exophytic simple appearing cyst off of the midpole of the left kidney.  No acute osseous abnormalities are seen.  Review of the MIP images confirms the above findings.  IMPRESSION: Extensive bilateral interstitial fibrotic changes with bronchiectasis and volume loss, consistent with the given history of interstitial pulmonary fibrosis. Mosaic attenuation of the lung parenchyma is suggestive of an element of superimposed obstructive pulmonary disease.  Marked enlargement of the main pulmonary artery, which is suggestive of pulmonary arterial hypertension.  Bilateral nonobstructing renal calculi.  Simple appearing left renal cyst.   Electronically Signed   By: Ted Mcalpine M.D.   On: 10/20/2014 19:43   I have personally reviewed and evaluated these images and lab results as part of my medical decision-making.   EKG Interpretation   Date/Time:  Saturday October 20 2014 15:49:18 EDT Ventricular Rate:  78 PR Interval:  158 QRS Duration: 82 QT Interval:  394 QTC Calculation: 449 R Axis:   100 Text Interpretation:  Normal sinus rhythm Rightward axis Low voltage QRS  Nonspecific T wave abnormality Abnormal ECG No significant change since  last tracing Confirmed by LITTLE MD, RACHEL ([id number]) on 10/20/2014 4:51:40  PM      MDM   Final diagnoses:  Hypoxia    11:19 AM BP 116/51 mmHg  Pulse 66  Temp(Src) 97.7 F (36.5 C) (Oral)  Resp 18  Ht 5' 1.5" (1.562 m)  Wt 222 lb 0.1 oz (100.7 kg)  BMI 41.27 kg/m2  SpO2 97% Patient with sob, new oxygen requirement. Denies orthopnea or PND. ? CHF, CAP, worsening IPF. Labs and imaging pending. No blood thinners. HGB lower than normal. (appears to have a  macrocytic anemia)    Patient seen in shared visit with attending physician. Patient cxr unchanged. Her BNP is unremarkable. No fever/ leukiocytosis. I doubt CAP. WIll obtain a CTPE study. Patietn will be admitted by Dr. Christie Nottingham, PA-C 10/22/14 1121  Laurence Spates, MD 10/24/14 [phone]

## 2014-10-21 ENCOUNTER — Inpatient Hospital Stay (HOSPITAL_COMMUNITY): Payer: Medicare Other

## 2014-10-21 ENCOUNTER — Encounter (HOSPITAL_COMMUNITY): Payer: Self-pay

## 2014-10-21 DIAGNOSIS — R0600 Dyspnea, unspecified: Secondary | ICD-10-CM

## 2014-10-21 DIAGNOSIS — J8410 Pulmonary fibrosis, unspecified: Secondary | ICD-10-CM

## 2014-10-21 DIAGNOSIS — R0902 Hypoxemia: Secondary | ICD-10-CM | POA: Insufficient documentation

## 2014-10-21 DIAGNOSIS — K219 Gastro-esophageal reflux disease without esophagitis: Secondary | ICD-10-CM

## 2014-10-21 LAB — CBC WITH DIFFERENTIAL/PLATELET
Basophils Absolute: 0 10*3/uL (ref 0.0–0.1)
Basophils Relative: 0 %
EOS PCT: 0 %
Eosinophils Absolute: 0 10*3/uL (ref 0.0–0.7)
HCT: 34.8 % — ABNORMAL LOW (ref 36.0–46.0)
HEMOGLOBIN: 11.4 g/dL — AB (ref 12.0–15.0)
LYMPHS ABS: 0.6 10*3/uL — AB (ref 0.7–4.0)
LYMPHS PCT: 9 %
MCH: 33.3 pg (ref 26.0–34.0)
MCHC: 32.8 g/dL (ref 30.0–36.0)
MCV: 101.8 fL — AB (ref 78.0–100.0)
Monocytes Absolute: 0.1 10*3/uL (ref 0.1–1.0)
Monocytes Relative: 1 %
NEUTROS PCT: 90 %
Neutro Abs: 6.1 10*3/uL (ref 1.7–7.7)
Platelets: 261 10*3/uL (ref 150–400)
RBC: 3.42 MIL/uL — AB (ref 3.87–5.11)
RDW: 14.4 % (ref 11.5–15.5)
WBC: 6.7 10*3/uL (ref 4.0–10.5)

## 2014-10-21 LAB — C-REACTIVE PROTEIN: CRP: 1.3 mg/dL — ABNORMAL HIGH (ref <1.0)

## 2014-10-21 LAB — SEDIMENTATION RATE: Sed Rate: 58 mm/hr — ABNORMAL HIGH (ref 0–22)

## 2014-10-21 MED ORDER — FUROSEMIDE 20 MG PO TABS
20.0000 mg | ORAL_TABLET | Freq: Two times a day (BID) | ORAL | Status: DC
  Administered 2014-10-21 – 2014-10-23 (×4): 20 mg via ORAL
  Filled 2014-10-21 (×4): qty 1

## 2014-10-21 MED ORDER — METHYLPREDNISOLONE SODIUM SUCC 40 MG IJ SOLR
40.0000 mg | Freq: Two times a day (BID) | INTRAMUSCULAR | Status: DC
  Administered 2014-10-22 – 2014-10-23 (×3): 40 mg via INTRAVENOUS
  Filled 2014-10-21 (×3): qty 1

## 2014-10-21 MED ORDER — PNEUMOCOCCAL VAC POLYVALENT 25 MCG/0.5ML IJ INJ
0.5000 mL | INJECTION | INTRAMUSCULAR | Status: AC
  Administered 2014-10-22: 0.5 mL via INTRAMUSCULAR
  Filled 2014-10-21: qty 0.5

## 2014-10-21 NOTE — Progress Notes (Signed)
[facility]                                                                              Patient Demographics  Miranda Hines, is a 74 y.o. female, DOB - 09/03/1940, ZOX:[id number]  Admit date - 10/20/2014   Admitting Physician Hillary Bow, DO  Outpatient Primary MD for the patient is FULP, [name], MD  LOS - 1   Chief Complaint  Patient presents with  . Fatigue      HPI on 10/20/2014 by Dr. Lyda Perone Miranda Hines is a 74 y.o. female with h/o interstitial pulmonary fibrosis. Patient presents to the ED with worsening SOB and hypoxia. Normally 2L O2 at rest and 4L while ambulating. For past week has had increasing SOB especially with exertion. She has had to increase her home O2 to meet demand. She has no new cough, fevers, chills. No h/o DVT/PE.  Assessment & Plan   Dyspnea secondary to interstitial pulmonary fibrosis -CTA chest: Interstitial pulmonary fibrosis with possible superimposed active pulmonary disease, pulmonary arterial hypertension -CXR negative for infection -Pulmonology consultation appreciated -Continue Solu-Medrol -Echocardiogram pending -BNP 88 -Continue supplemental oxygen, singulair  GERD -Continue PPI and pepcid  Code Status: Full  Family Communication: None at bedside  Disposition Plan: Admitted.   Time Spent in minutes   30 minutes  Procedures  None  Consults   PCCM  DVT Prophylaxis  heparin  Lab Results  Component Value Date   PLT 222 10/20/2014    Medications  Scheduled Meds: . aspirin  81 mg Oral Daily  . famotidine  20 mg Oral QHS  . heparin  5,000 Units Subcutaneous 3 times per day  . methylPREDNISolone (SOLU-MEDROL) injection  40 mg Intravenous Q6H  . montelukast  10 mg Oral QHS  . pantoprazole  40 mg Oral Daily  . [START ON 10/22/2014] pneumococcal 23 valent vaccine  0.5 mL Intramuscular Tomorrow-1000   Continuous Infusions:  PRN Meds:.guaiFENesin  Antibiotics    Anti-infectives    None       Subjective:   Miranda Hines seen and examined today. Patient denies recent illness or fever. Denies any chest pain, bone pain, nausea or vomiting. States she has been short of breath since about Friday. RTC for husband in the nursing home yesterday when she became short of breath and could not walk down a long hallway.  Objective:   Filed Vitals:   10/20/14 2000 10/20/14 2207 10/20/14 2228 10/21/14 0504  BP: 118/57 125/62 93/76 124/47  Pulse: 84 74 79 63  Temp:   98.3 F (36.8 C) 97.8 F (36.6 C)  TempSrc:   Oral   Resp: 23 30 24 20   Height:   5' 1.5" (1.562 m)   Weight:   100.7 kg (222 lb 0.1 oz)   SpO2: 96% 95% 93% 97%    Wt Readings from Last 3 Encounters:  10/20/14 100.7 kg (222 lb 0.1 oz)  10/08/14 99.6 kg (219 lb 9.3 oz)  08/28/14 99.428 kg (219 lb 3.2 oz)     Intake/Output Summary (Last 24 hours) at 10/21/14 1206 Last data filed at 10/21/14 0825  Gross per 24 hour  Intake    250 ml  Output      0 ml  Net    250 ml    Exam  General: Well developed, well nourished, NAD, appears stated age  HEENT: NCAT, mucous membranes moist.   Cardiovascular: S1 S2 auscultated, no rubs, murmurs or gallops. Regular rate and rhythm.  Respiratory: Bilateral crackles, dry cough  Abdomen: Soft, obese, nontender, nondistended, + bowel sounds  Extremities: warm dry without cyanosis clubbing. LE edema L>R  Neuro: AAOx3, nonfocal  Psych: Normal affect and demeanor with intact judgement and insight  Data Review   Micro Results No results found for this or any previous visit (from the past 240 hour(s)).  Radiology Reports Dg Chest 2 View  10/20/2014   CLINICAL DATA:  Short of breath, 2 days duration. Chronic lung disease. Low oxygen saturation.  EXAM: CHEST  2 VIEW  COMPARISON:  10/06/2013  FINDINGS: Heart size is unchanged. Atherosclerosis of the aorta again seen. Widespread pulmonary fibrotic pattern is unchanged from the study 10/06/2013. No area of dense  consolidation or collapse. No effusion. Certainly, acute inflammatory change could be hidden amongst the extensive chronic fibrotic changes.  IMPRESSION: No change. Chronic and advanced widespread pulmonary fibrosis. No sign of acute infiltrate or collapse. However, due to the extensive fibrotic changes, acute inflammation could be hidden.   Electronically Signed   By: Paulina Fusi M.D.   On: 10/20/2014 17:39   Ct Angio Chest Pe W/cm &/or Wo Cm  10/20/2014   CLINICAL DATA:  Patient with interstitial pulmonary fibrosis with chronic oxygen dependence, recently increased shortness of breath. Worsened hypoxia for the last 4-5 days.  EXAM: CT ANGIOGRAPHY CHEST WITH CONTRAST  TECHNIQUE: Multidetector CT imaging of the chest was performed using the standard protocol during bolus administration of intravenous contrast. Multiplanar CT image reconstructions and MIPs were obtained to evaluate the vascular anatomy.  CONTRAST:  OMNIPAQUE IOHEXOL 350 MG/ML SOLN  COMPARISON:  Chest radiograph dated 10/20/2014  FINDINGS: There is no evidence of pulmonary embolus.  There is no aortic dissection. The aorta is normal in caliber. The main pulmonary artery measures 4.3 cm and is larger than the ascending aorta, suggestive of pulmonary arterial hypertension.  There is no evidence of mediastinal lymphadenopathy. No axillary lymphadenopathy is seen. The visualized portions of the thyroid gland are unremarkable in appearance.  There are patchy widespread bilateral interstitial changes, with volume loss and bronchiectasis. Mosaic attenuation of the lung parenchyma is also seen. No evidence of pleural effusions. No evidence of pneumothorax.  The visualized portions of the liver and spleen are unremarkable.  The visualized portions of the pancreas, gallbladder, stomach, adrenal glands are within normal limits. There are bilateral nonobstructing renal calculi. There is a 2 cm exophytic simple appearing cyst off of the midpole of the  left kidney.  No acute osseous abnormalities are seen.  Review of the MIP images confirms the above findings.  IMPRESSION: Extensive bilateral interstitial fibrotic changes with bronchiectasis and volume loss, consistent with the given history of interstitial pulmonary fibrosis. Mosaic attenuation of the lung parenchyma is suggestive of an element of superimposed obstructive pulmonary disease.  Marked enlargement of the main pulmonary artery, which is suggestive of pulmonary arterial hypertension.  Bilateral nonobstructing renal calculi.  Simple appearing left renal cyst.   Electronically Signed   By: Ted Mcalpine M.D.   On: 10/20/2014 19:43    CBC  Recent Labs Lab 10/20/14 1612  WBC 7.0  HGB 11.4*  HCT 34.8*  PLT 222  MCV 103.3*  MCH 33.8  MCHC 32.8  RDW 14.6    Chemistries   Recent Labs Lab 10/20/14 1612  NA 137  K 3.9  CL 98*  CO2 32  GLUCOSE 112*  BUN 13  CREATININE 0.61  CALCIUM 8.6*   ------------------------------------------------------------------------------------------------------------------ estimated creatinine clearance is 69.9 mL/min (by C-G formula based on Cr of 0.61). ------------------------------------------------------------------------------------------------------------------ No results for input(s): HGBA1C in the last 72 hours. ------------------------------------------------------------------------------------------------------------------ No results for input(s): CHOL, HDL, LDLCALC, TRIG, CHOLHDL, LDLDIRECT in the last 72 hours. ------------------------------------------------------------------------------------------------------------------ No results for input(s): TSH, T4TOTAL, T3FREE, THYROIDAB in the last 72 hours.  Invalid input(s): FREET3 ------------------------------------------------------------------------------------------------------------------ No results for input(s): VITAMINB12, FOLATE, FERRITIN, TIBC, IRON, RETICCTPCT in the  last 72 hours.  Coagulation profile No results for input(s): INR, PROTIME in the last 168 hours.  No results for input(s): DDIMER in the last 72 hours.  Cardiac Enzymes No results for input(s): CKMB, TROPONINI, MYOGLOBIN in the last 168 hours.  Invalid input(s): CK ------------------------------------------------------------------------------------------------------------------ Invalid input(s): POCBNP    Miranda Hines D.O. on 10/21/2014 at 12:06 PM  Between 7am to 7pm - Pager - [phone]  After 7pm go to [email or web address] - password [email or web address]  And look for the night coverage person covering for me after hours  [facility] Office  [phone]

## 2014-10-21 NOTE — Consult Note (Signed)
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 >work up to date included: negative ace level & elevated sed rate. Admitted on 10/8 w/ approx 1 week h/o progressive dyspnea. CT chest w/ diffuse interstitial fibrotic changes. PCCM asked to eval.   SIGNIFICANT EVENTS    STUDIES:  ECHO: 10/9: EF 60-65%; no wall motion abnormality; grade I diastolic dysfxn.  CT chest 10/8: Extensive bilateral interstitial fibrotic changes with bronchiectasis and volume loss, consistent with the given history of interstitial pulmonary fibrosis. Mosaic attenuation of the lung parenchyma is suggestive of an element of superimposed obstructive pulmonary disease. Marked enlargement of the main pulmonary artery, which is suggestive of pulmonary arterial hypertension.  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 >work up to date included: negative ace level & elevated sed rate. Admitted on 10/8 w/ approx 1 week h/o progressive dyspnea. No cough, fever, chills, no sick exposures. No chest pain. Has chronic LE edema. CT chest w/ diffuse interstitial fibrotic changes. PCCM asked to eval. On evaluation she denies: travel, pet dander exposure, bird exposure, mold or dust exposure, AC units functioning. No familial auto-immune diseases such as RA or SLE. Has chronic LE swelling.   PAST MEDICAL HISTORY :   has a past medical history of Interstitial lung disease (HCC) and IPF (idiopathic pulmonary fibrosis) (HCC).  has past surgical history that includes Appendectomy and Tubal ligation. Prior to Admission medications   Medication Sig Start  Date End Date Taking? Authorizing Provider  aspirin 81 MG tablet Take 81 mg by mouth daily.   Yes Historical Provider, MD  famotidine (PEPCID) 20 MG tablet Take 1 tablet (20 mg total) by mouth at bedtime. 06/04/14  Yes Nyoka Cowden, MD  guaiFENesin (MUCINEX) 600 MG 12 hr tablet Take 1,200 mg by mouth as needed for congestion.   Yes Historical Provider, MD  montelukast (SINGULAIR) 10 MG tablet take 1 tablet by mouth at bedtime 06/01/14  Yes Nyoka Cowden, MD  pantoprazole (PROTONIX) 40 MG tablet TAKE 1 TABLET BY MOUTH 30 TO 60 MINUTES BEFORE FIRST MEAL OF THE DAY 10/10/13  Yes Nyoka Cowden, MD  phenylephrine (SUDAFED PE) 10 MG TABS tablet Take 10 mg by mouth every 4 (four) hours as needed.   Yes Historical Provider, MD  predniSONE (DELTASONE) 10 MG tablet TAKE 2 TABLETS BY MOUTH DAILY Patient taking differently: TAKE 1.5 TABLETS BY MOUTH DAILY 10/16/14  Yes Nyoka Cowden, MD  Saline (SIMPLY SALINE) 0.9 % AERS Place 1 spray into the nose daily as needed (allergies).    Yes Historical Provider, MD   Allergies  Allergen Reactions  . Penicillins Swelling    FAMILY HISTORY:  family history includes COPD in her brother and mother; Kidney failure in her mother. SOCIAL HISTORY:  reports that she has been passively smoking.  She does not have any smokeless tobacco history on file. She reports that she does not drink alcohol or use illicit drugs.  REVIEW OF SYSTEMS:   Constitutional: Negative for fever, chills, weight loss, malaise/fatigue and diaphoresis.  HENT: Negative for hearing loss,  ear pain, nosebleeds, congestion, sore throat, neck pain, tinnitus and ear discharge.   Eyes: Negative for blurred vision, double vision, photophobia, pain, discharge and redness.  Respiratory: Negative for cough, hemoptysis, sputum production, shortness of breath, wheezing and stridor.   Cardiovascular: Negative for chest pain, palpitations, orthopnea, claudication, leg swelling and PND.  Gastrointestinal:  Negative for heartburn, nausea, vomiting, abdominal pain, diarrhea, constipation, blood in stool and melena.  Genitourinary: Negative for dysuria, urgency, frequency, hematuria and flank pain.  Musculoskeletal: Negative for myalgias, back pain, joint pain and falls.  Skin: Negative for itching and rash.  Neurological: Negative for dizziness, tingling, tremors, sensory change, speech change, focal weakness, seizures, loss of consciousness, weakness and headaches.  Endo/Heme/Allergies: Negative for environmental allergies and polydipsia. Does not bruise/bleed easily.  SUBJECTIVE:  No distress  VITAL SIGNS: Temp:  [97.8 F (36.6 C)-98.5 F (36.9 C)] 98.5 F (36.9 C) (10/09 1431) Pulse Rate:  [63-88] 70 (10/09 1431) Resp:  [18-34] 18 (10/09 1431) BP: (93-127)/(47-76) 127/63 mmHg (10/09 1431) SpO2:  [84 %-98 %] 84 % (10/09 1431) Weight:  [100.7 kg (222 lb 0.1 oz)] 100.7 kg (222 lb 0.1 oz) (10/08 2228)  PHYSICAL EXAMINATION: General:  74 year old white female currently resting while sitting at side of bed. Now in no distress  Neuro:  Awake, oriented. No focal def  HEENT:  NCAT, voice hoarse. No JVD Cardiovascular:  rrr Lungs:  Dry posterior rales, no accessory muscle use  Abdomen:  Soft, non-tender + bowel sounds  Musculoskeletal:  Intact  Skin:  LE edema 4+   Recent Labs Lab 10/20/14 1612  NA 137  K 3.9  CL 98*  CO2 32  BUN 13  CREATININE 0.61  GLUCOSE 112*    Recent Labs Lab 10/20/14 1612  HGB 11.4*  HCT 34.8*  WBC 7.0  PLT 222   Dg Chest 2 View  10/20/2014   CLINICAL DATA:  Short of breath, 2 days duration. Chronic lung disease. Low oxygen saturation.  EXAM: CHEST  2 VIEW  COMPARISON:  10/06/2013  FINDINGS: Heart size is unchanged. Atherosclerosis of the aorta again seen. Widespread pulmonary fibrotic pattern is unchanged from the study 10/06/2013. No area of dense consolidation or collapse. No effusion. Certainly, acute inflammatory change could be hidden amongst the  extensive chronic fibrotic changes.  IMPRESSION: No change. Chronic and advanced widespread pulmonary fibrosis. No sign of acute infiltrate or collapse. However, due to the extensive fibrotic changes, acute inflammation could be hidden.   Electronically Signed   By: Paulina Fusi M.D.   On: 10/20/2014 17:39   Ct Angio Chest Pe W/cm &/or Wo Cm  10/20/2014   CLINICAL DATA:  Patient with interstitial pulmonary fibrosis with chronic oxygen dependence, recently increased shortness of breath. Worsened hypoxia for the last 4-5 days.  EXAM: CT ANGIOGRAPHY CHEST WITH CONTRAST  TECHNIQUE: Multidetector CT imaging of the chest was performed using the standard protocol during bolus administration of intravenous contrast. Multiplanar CT image reconstructions and MIPs were obtained to evaluate the vascular anatomy.  CONTRAST:  OMNIPAQUE IOHEXOL 350 MG/ML SOLN  COMPARISON:  Chest radiograph dated 10/20/2014  FINDINGS: There is no evidence of pulmonary embolus.  There is no aortic dissection. The aorta is normal in caliber. The main pulmonary artery measures 4.3 cm and is larger than the ascending aorta, suggestive of pulmonary arterial hypertension.  There is no evidence of mediastinal lymphadenopathy. No axillary lymphadenopathy is seen. The visualized portions of the thyroid gland are unremarkable in appearance.  There  are patchy widespread bilateral interstitial changes, with volume loss and bronchiectasis. Mosaic attenuation of the lung parenchyma is also seen. No evidence of pleural effusions. No evidence of pneumothorax.  The visualized portions of the liver and spleen are unremarkable.  The visualized portions of the pancreas, gallbladder, stomach, adrenal glands are within normal limits. There are bilateral nonobstructing renal calculi. There is a 2 cm exophytic simple appearing cyst off of the midpole of the left kidney.  No acute osseous abnormalities are seen.  Review of the MIP images confirms the above  findings.  IMPRESSION: Extensive bilateral interstitial fibrotic changes with bronchiectasis and volume loss, consistent with the given history of interstitial pulmonary fibrosis. Mosaic attenuation of the lung parenchyma is suggestive of an element of superimposed obstructive pulmonary disease.  Marked enlargement of the main pulmonary artery, which is suggestive of pulmonary arterial hypertension.  Bilateral nonobstructing renal calculi.  Simple appearing left renal cyst.   Electronically Signed   By: Ted Mcalpine M.D.   On: 10/20/2014 19:43    ASSESSMENT / PLAN:  Acute on Chronic respiratory failure in setting of known steroid responsive ILD. To date eval has been Ace level 53 and sed rate, high res CT that was not c/w UIP and skin bx that was negative for sarcoid.  Was to initially be referred to [facility]. 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 Do not think that the reported PAS reflects her actual PAPs would argue they are likely much worse.  Plan Cont O2 will need walking oximetry prior to dc Decreased steroids. Will need to taper to lowest effective dose Have sent initial auto immune eval consisting of: ANA, ANCA titer, Anti-DNA antibodies, CRP,C3 and C4 complement, total compliment, HSP panel, anca antibodies, RF, and sed rate.  Will ask Dr Marchelle Gearing to see her & will ensure she has f/u with her in the out pt setting.   Simonne Martinet ACNP-BC [facility] Pulmonary/Critical Care Pager # [phone] OR # [phone] if no answer   10/21/2014, 4:08 PM  STAFF NOTE: I, Rory Percy, MD FACP have personally reviewed patient's available data, including medical history, events of note, physical examination and test results as part of my evaluation. I have discussed with resident/NP and other care providers such as pharmacist, RN and RRT. In addition, I personally evaluated patient and elicited key findings of: No distress, fine wet and dry crackles basilar  dependent, she feels a little better, my understanding is she has had eos in past as well and per pulm notes has been at least partially steroid responsive, I have reviewed all her scans and pcxr and labs available to me now, would order full auto immune work up now, maintain steroids to q12h, echo reports pa 25 but CT chest shows large Pa tree, would favor that echo was not accurate as likely she does have pulm htn significant from ILD as she also has jvd and edema, would benefit from lasix more aggressive, also will have Dr Marchelle Gearing see her in am as she is about to have an office consultation with him anyway, rarely Singulair can have eosinophil pneumonitis ( churg strauss) would favor to dc this med for now, will need ambulatory pulse oximetry in am    Mcarthur Rossetti. Tyson Alias, MD, FACP Pgr: [phone] [facility] 10/21/2014 4:41 PM '

## 2014-10-21 NOTE — Progress Notes (Signed)
  Echocardiogram 2D Echocardiogram has been performed.  Arvil Chaco 10/21/2014, 10:28 AM

## 2014-10-22 ENCOUNTER — Inpatient Hospital Stay (HOSPITAL_COMMUNITY): Payer: Medicare Other

## 2014-10-22 DIAGNOSIS — J9621 Acute and chronic respiratory failure with hypoxia: Secondary | ICD-10-CM

## 2014-10-22 DIAGNOSIS — J84112 Idiopathic pulmonary fibrosis: Secondary | ICD-10-CM | POA: Diagnosis not present

## 2014-10-22 DIAGNOSIS — R0989 Other specified symptoms and signs involving the circulatory and respiratory systems: Secondary | ICD-10-CM

## 2014-10-22 LAB — C3 COMPLEMENT: C3 Complement: 148 mg/dL (ref 82–167)

## 2014-10-22 LAB — BASIC METABOLIC PANEL
ANION GAP: 7 (ref 5–15)
BUN: 16 mg/dL (ref 6–20)
CALCIUM: 9.4 mg/dL (ref 8.9–10.3)
CO2: 36 mmol/L — ABNORMAL HIGH (ref 22–32)
Chloride: 97 mmol/L — ABNORMAL LOW (ref 101–111)
Creatinine, Ser: 0.58 mg/dL (ref 0.44–1.00)
GLUCOSE: 116 mg/dL — AB (ref 65–99)
POTASSIUM: 4.2 mmol/L (ref 3.5–5.1)
SODIUM: 140 mmol/L (ref 135–145)

## 2014-10-22 LAB — RHEUMATOID FACTOR

## 2014-10-22 LAB — PHOSPHORUS: PHOSPHORUS: 5.1 mg/dL — AB (ref 2.5–4.6)

## 2014-10-22 LAB — MAGNESIUM: MAGNESIUM: 2.2 mg/dL (ref 1.7–2.4)

## 2014-10-22 LAB — C4 COMPLEMENT: COMPLEMENT C4, BODY FLUID: 26 mg/dL (ref 14–44)

## 2014-10-22 MED ORDER — IPRATROPIUM-ALBUTEROL 0.5-2.5 (3) MG/3ML IN SOLN
3.0000 mL | Freq: Four times a day (QID) | RESPIRATORY_TRACT | Status: DC
  Administered 2014-10-22 – 2014-10-23 (×3): 3 mL via RESPIRATORY_TRACT
  Filled 2014-10-22 (×4): qty 3

## 2014-10-22 MED ORDER — FUROSEMIDE 10 MG/ML IJ SOLN
20.0000 mg | Freq: Once | INTRAMUSCULAR | Status: AC
  Administered 2014-10-22: 20 mg via INTRAVENOUS
  Filled 2014-10-22: qty 2

## 2014-10-22 NOTE — Progress Notes (Signed)
[facility]                                                                              Patient Demographics  Miranda Hines, is a 74 y.o. female, DOB - [birth year], ZOX:[id number]  Admit date - 10/20/2014   Admitting Physician Hillary Bow, DO  Outpatient Primary MD for the patient is FULP, [name], MD  LOS - 2   Chief Complaint  Patient presents with  . Fatigue      HPI on 10/20/2014 by Dr. Lyda Perone Miranda Hines is a 74 y.o. female with h/o interstitial pulmonary fibrosis. Patient presents to the ED with worsening SOB and hypoxia. Normally 2L O2 at rest and 4L while ambulating. For past week has had increasing SOB especially with exertion. She has had to increase her home O2 to meet demand. She has no new cough, fevers, chills. No h/o DVT/PE.  Assessment & Plan   Acute on chronic respiratory failure secondary to interstitial pulmonary fibrosis -CTA chest: Interstitial pulmonary fibrosis with possible superimposed active pulmonary disease, pulmonary arterial hypertension -CXR negative for infection -Pulmonology consultation appreciated -Echocardiogram: EF 60-65%, grade 1 diastolic dysfunction, PA pressure 25 (nromal) -BNP 88  -Continue supplemental oxygen -Given findings of CT chest and Echo, have asked cardiology to reevaluate echocardiogram -Pending autoimmmue workup -Continue IV steroids, nebs, lasix -Pulm ordered CT sinus and recommended possible discharge on 10/23/2014  GERD -Continue PPI and pepcid  Code Status: Full  Family Communication: None at bedside  Disposition Plan: Admitted.   Time Spent in minutes   30 minutes  Procedures  None  Consults   PCCM  DVT Prophylaxis  heparin  Lab Results  Component Value Date   PLT 261 10/21/2014    Medications  Scheduled Meds: . aspirin  81 mg Oral Daily  . famotidine  20 mg Oral QHS  . furosemide  20 mg Intravenous Once  . furosemide  20 mg Oral BID  . heparin  5,000 Units  Subcutaneous 3 times per day  . ipratropium-albuterol  3 mL Nebulization Q6H  . methylPREDNISolone (SOLU-MEDROL) injection  40 mg Intravenous Q12H  . pantoprazole  40 mg Oral Daily   Continuous Infusions:  PRN Meds:.guaiFENesin  Antibiotics    Anti-infectives    None      Subjective:   Miranda Hines seen and examined today. Patient feels her breathing has improved slightly.  She denies chest pain, abdominal pain, dizziness, headache.   Objective:   Filed Vitals:   10/21/14 0504 10/21/14 1431 10/21/14 2100 10/22/14 0500  BP: 124/47 127/63 107/58 116/51  Pulse: 63 70 68 66  Temp: 97.8 F (36.6 C) 98.5 F (36.9 C) 97.9 F (36.6 C) 97.7 F (36.5 C)  TempSrc:  Oral Oral Oral  Resp: 20 18 18 18   Height:      Weight:      SpO2: 97% 84% 96% 97%    Wt Readings from Last 3 Encounters:  10/20/14 100.7 kg (222 lb 0.1 oz)  10/08/14 99.6 kg (219 lb 9.3 oz)  08/28/14 99.428 kg (219 lb 3.2 oz)     Intake/Output Summary (Last 24 hours) at 10/22/14 1222 Last data filed at 10/22/14 0945  Gross per 24 hour  Intake    870 ml  Output      3 ml  Net    867 ml    Exam  General: Well developed, well nourished, NAD  HEENT: NCAT, mucous membranes moist.   Cardiovascular: S1 S2 auscultated, RRR, no murmurs  Respiratory: Dry cough, dry rales/crackles  Abdomen: Soft, obese, nontender, nondistended, + bowel sounds  Extremities: warm dry without cyanosis clubbing. LE edema L>R (improving slightly)  Neuro: AAOx3, nonfocal  Psych: Normal affect and demeanor, pleasant  Data Review   Micro Results No results found for this or any previous visit (from the past 240 hour(s)).  Radiology Reports Dg Chest 2 View  10/20/2014   CLINICAL DATA:  Short of breath, 2 days duration. Chronic lung disease. Low oxygen saturation.  EXAM: CHEST  2 VIEW  COMPARISON:  10/06/2013  FINDINGS: Heart size is unchanged. Atherosclerosis of the aorta again seen. Widespread pulmonary fibrotic  pattern is unchanged from the study 10/06/2013. No area of dense consolidation or collapse. No effusion. Certainly, acute inflammatory change could be hidden amongst the extensive chronic fibrotic changes.  IMPRESSION: No change. Chronic and advanced widespread pulmonary fibrosis. No sign of acute infiltrate or collapse. However, due to the extensive fibrotic changes, acute inflammation could be hidden.   Electronically Signed   By: Paulina Fusi M.D.   On: 10/20/2014 17:39   Ct Angio Chest Pe W/cm &/or Wo Cm  10/20/2014   CLINICAL DATA:  Patient with interstitial pulmonary fibrosis with chronic oxygen dependence, recently increased shortness of breath. Worsened hypoxia for the last 4-5 days.  EXAM: CT ANGIOGRAPHY CHEST WITH CONTRAST  TECHNIQUE: Multidetector CT imaging of the chest was performed using the standard protocol during bolus administration of intravenous contrast. Multiplanar CT image reconstructions and MIPs were obtained to evaluate the vascular anatomy.  CONTRAST:  OMNIPAQUE IOHEXOL 350 MG/ML SOLN  COMPARISON:  Chest radiograph dated 10/20/2014  FINDINGS: There is no evidence of pulmonary embolus.  There is no aortic dissection. The aorta is normal in caliber. The main pulmonary artery measures 4.3 cm and is larger than the ascending aorta, suggestive of pulmonary arterial hypertension.  There is no evidence of mediastinal lymphadenopathy. No axillary lymphadenopathy is seen. The visualized portions of the thyroid gland are unremarkable in appearance.  There are patchy widespread bilateral interstitial changes, with volume loss and bronchiectasis. Mosaic attenuation of the lung parenchyma is also seen. No evidence of pleural effusions. No evidence of pneumothorax.  The visualized portions of the liver and spleen are unremarkable.  The visualized portions of the pancreas, gallbladder, stomach, adrenal glands are within normal limits. There are bilateral nonobstructing renal calculi. There is a  2 cm exophytic simple appearing cyst off of the midpole of the left kidney.  No acute osseous abnormalities are seen.  Review of the MIP images confirms the above findings.  IMPRESSION: Extensive bilateral interstitial fibrotic changes with bronchiectasis and volume loss, consistent with the given history of interstitial pulmonary fibrosis. Mosaic attenuation of the lung parenchyma is suggestive of an element of superimposed obstructive pulmonary disease.  Marked enlargement of the main pulmonary artery, which is suggestive of pulmonary arterial hypertension.  Bilateral nonobstructing renal calculi.  Simple appearing left renal cyst.   Electronically Signed   By: Ted Mcalpine M.D.   On: 10/20/2014 19:43    CBC  Recent Labs Lab 10/20/14 1612 10/21/14 1845  WBC 7.0 6.7  HGB 11.4* 11.4*  HCT 34.8* 34.8*  PLT 222  261  MCV 103.3* 101.8*  MCH 33.8 33.3  MCHC 32.8 32.8  RDW 14.6 14.4  LYMPHSABS  --  0.6*  MONOABS  --  0.1  EOSABS  --  0.0  BASOSABS  --  0.0    Chemistries   Recent Labs Lab 10/20/14 1612 10/22/14 0355  NA 137 140  K 3.9 4.2  CL 98* 97*  CO2 32 36*  GLUCOSE 112* 116*  BUN 13 16  CREATININE 0.61 0.58  CALCIUM 8.6* 9.4  MG  --  2.2   ------------------------------------------------------------------------------------------------------------------ estimated creatinine clearance is 69.9 mL/min (by C-G formula based on Cr of 0.58). ------------------------------------------------------------------------------------------------------------------ No results for input(s): HGBA1C in the last 72 hours. ------------------------------------------------------------------------------------------------------------------ No results for input(s): CHOL, HDL, LDLCALC, TRIG, CHOLHDL, LDLDIRECT in the last 72 hours. ------------------------------------------------------------------------------------------------------------------ No results for input(s): TSH, T4TOTAL, T3FREE,  THYROIDAB in the last 72 hours.  Invalid input(s): FREET3 ------------------------------------------------------------------------------------------------------------------ No results for input(s): VITAMINB12, FOLATE, FERRITIN, TIBC, IRON, RETICCTPCT in the last 72 hours.  Coagulation profile No results for input(s): INR, PROTIME in the last 168 hours.  No results for input(s): DDIMER in the last 72 hours.  Cardiac Enzymes No results for input(s): CKMB, TROPONINI, MYOGLOBIN in the last 168 hours.  Invalid input(s): CK ------------------------------------------------------------------------------------------------------------------ Invalid input(s): POCBNP    Miranda Hines D.O. on 10/22/2014 at 12:22 PM  Between 7am to 7pm - Pager - [phone]  After 7pm go to [email or web address] - password [email or web address]  And look for the night coverage person covering for me after hours  [facility] Office  [phone]

## 2014-10-22 NOTE — Consult Note (Addendum)
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 >work up to date included: negative ace level & elevated sed rate. Admitted on 10/8 w/ approx 1 week h/o progressive dyspnea. No cough, fever, chills, no sick exposures. No chest pain. Has chronic LE edema. CT chest w/ diffuse interstitial fibrotic changes. PCCM asked to eval. On evaluation she denies: travel, pet dander exposure, bird exposure, mold or dust exposure, AC units functioning. No familial auto-immune diseases such as RA or SLE. Has chronic LE swelling.   STUDIES: /events ECHO: 10/9: EF 60-65%; no wall motion abnormality; grade I diastolic dysfxn.  CT chest 10/8: Extensive bilateral interstitial fibrotic changes with bronchiectasis and volume loss, consistent with the given history of interstitial pulmonary fibrosis. Mosaic attenuation of the lung parenchyma is suggestive of an element of superimposed obstructive pulmonary disease. Marked enlargement of the main pulmonary artery, which is suggestive of pulmonary arterial hypertension.   SUBJECTIVE:  10/22/14: reports feeling near baseline currently. Says that ILD dx 2 years ago. Again denies mold, mildew, bird exposure. Denies known autoiommune disease. One year ago went on nocturnal o2. Several months ago needing continuous o2 - resting pulse ox on RA at home 84% per patient. STarted on daily steroids around then.  Autpimmune panel sent. Also, reports crhonic sinus and hoarse voice issues - never seen allergy, ENT  VITAL SIGNS: Temp:  [97.7 F (36.5 C)-98.5 F (36.9 C)] 97.7 F (36.5 C) (10/10 0500) Pulse Rate:  [66-70] 66 (10/10 0500) Resp:  [18] 18 (10/10 0500) BP: (107-127)/(51-63) 116/51 mmHg (10/10  0500) SpO2:  [84 %-97 %] 97 % (10/10 0500)  PHYSICAL EXAMINATION: General:  74 year old white female currently resting while sitting at side of bed. Now in no distress . OBESE, SOMEWHAT CUSHINGOID Neuro:  Awake, oriented. No focal def  HEENT:  NCAT, voice hoarse. No JVD Cardiovascular:  rrr Lungs:  Dry posterior rales, no accessory muscle use . Some scattered wh3eeze + Abdomen:  Soft, non-tender + bowel sounds  Musculoskeletal:  Intact  Skin:  LE edema 4+   Recent Labs Lab 10/20/14 1612 10/22/14 0355  NA 137 140  K 3.9 4.2  CL 98* 97*  CO2 32 36*  BUN 13 16  CREATININE 0.61 0.58  GLUCOSE 112* 116*    Recent Labs Lab 10/20/14 1612 10/21/14 1845  HGB 11.4* 11.4*  HCT 34.8* 34.8*  WBC 7.0 6.7  PLT 222 261   Dg Chest 2 View  10/20/2014   CLINICAL DATA:  Short of breath, 2 days duration. Chronic lung disease. Low oxygen saturation.  EXAM: CHEST  2 VIEW  COMPARISON:  10/06/2013  FINDINGS: Heart size is unchanged. Atherosclerosis of the aorta again seen. Widespread pulmonary fibrotic pattern is unchanged from the study 10/06/2013. No area of dense consolidation or collapse. No effusion. Certainly, acute inflammatory change could be hidden amongst the extensive chronic fibrotic changes.  IMPRESSION: No change. Chronic and advanced widespread pulmonary fibrosis. No sign of acute infiltrate or collapse. However, due to the extensive fibrotic changes, acute inflammation could be hidden.   Electronically Signed   By: Paulina Fusi M.D.   On: 10/20/2014 17:39   Ct Angio Chest Pe W/cm &/or Wo Cm  10/20/2014   CLINICAL DATA:  Patient with interstitial pulmonary fibrosis with chronic oxygen dependence, recently increased shortness of breath. Worsened hypoxia for the last 4-5 days.  EXAM: CT ANGIOGRAPHY CHEST WITH CONTRAST  TECHNIQUE: Multidetector CT imaging of the chest was performed using the standard protocol during bolus administration of intravenous contrast. Multiplanar CT image  reconstructions and MIPs were obtained to evaluate the vascular anatomy.  CONTRAST:  OMNIPAQUE IOHEXOL 350 MG/ML SOLN  COMPARISON:  Chest radiograph dated 10/20/2014  FINDINGS: There is no evidence of pulmonary embolus.  There is no aortic dissection. The aorta is normal in caliber. The main pulmonary artery measures 4.3 cm and is larger than the ascending aorta, suggestive of pulmonary arterial hypertension.  There is no evidence of mediastinal lymphadenopathy. No axillary lymphadenopathy is seen. The visualized portions of the thyroid gland are unremarkable in appearance.  There are patchy widespread bilateral interstitial changes, with volume loss and bronchiectasis. Mosaic attenuation of the lung parenchyma is also seen. No evidence of pleural effusions. No evidence of pneumothorax.  The visualized portions of the liver and spleen are unremarkable.  The visualized portions of the pancreas, gallbladder, stomach, adrenal glands are within normal limits. There are bilateral nonobstructing renal calculi. There is a 2 cm exophytic simple appearing cyst off of the midpole of the left kidney.  No acute osseous abnormalities are seen.  Review of the MIP images confirms the above findings.  IMPRESSION: Extensive bilateral interstitial fibrotic changes with bronchiectasis and volume loss, consistent with the given history of interstitial pulmonary fibrosis. Mosaic attenuation of the lung parenchyma is suggestive of an element of superimposed obstructive pulmonary disease.  Marked enlargement of the main pulmonary artery, which is suggestive of pulmonary arterial hypertension.  Bilateral nonobstructing renal calculi.  Simple appearing left renal cyst.   Electronically Signed   By: Ted Mcalpine M.D.   On: 10/20/2014 19:43    ASSESSMENT / PLAN:  Acute on Chronic respiratory failure  - Baseline: She has ILD of unclear etiology. CT 2014 read by our thoracic rad as NOT c/w UIP. However, at age > 41 pre-test  prob for UIP/IPF is high > 75% despite CT findings. Ddx is BOOP, CEP, NSIP, HP, Autoimmune   - Progression: clinically progressed chronically since diagnosis in fall 2014 based on history of increased o2 need. Noted: in PM discussion with rehab folks that patient desats on walking to 70s even on 10L [place]    - Currently: acute worsening - possibly due to acute diastolic dysfn (edema +) v cor pulmonale (large PA seen on CT but normal PASP per echo) v ILD flare - unclear   PLAN  - CT sinus - given hx of sinus issues  - Autoimmune panel - sent earlier today + add some more for 10/23/14   -  For now empiric IV steroid + lasix + add nebs  - walking desat test  - As opd restage disease + will likely need right heart cath  - IF autopimmune results negative + right  Heart cath ok  + o2 needs minimal -> consider surgical lung bx as opd - needs to be addressed later on / Not addressed now  - Long term needs weight loss +/- transplant eval  - address these in clinic /tomorrow rounds. Not addressed now .   Home possibly 10/23/14    Dr. Kalman Shan, M.D., F.C.C.P Pulmonary and Critical Care Medicine Staff Physician [facility] Pager: [phone], If  no answer or between  15:00h - 7:00h: call [phone]  10/22/2014 12:03 PM

## 2014-10-23 ENCOUNTER — Telehealth: Payer: Self-pay | Admitting: Internal Medicine

## 2014-10-23 ENCOUNTER — Encounter (HOSPITAL_COMMUNITY): Admission: RE | Admit: 2014-10-23 | Payer: Medicare Other | Source: Ambulatory Visit

## 2014-10-23 LAB — ANCA TITERS

## 2014-10-23 LAB — MPO/PR-3 (ANCA) ANTIBODIES

## 2014-10-23 LAB — CK TOTAL AND CKMB (NOT AT ARMC)
CK, MB: 1.4 ng/mL (ref 0.5–5.0)
Relative Index: INVALID (ref 0.0–2.5)
Total CK: 41 U/L (ref 38–234)

## 2014-10-23 LAB — COMPLEMENT, TOTAL

## 2014-10-23 LAB — ANTI-DNA ANTIBODY, DOUBLE-STRANDED: ds DNA Ab: 1 IU/mL (ref 0–9)

## 2014-10-23 MED ORDER — POTASSIUM CHLORIDE ER 10 MEQ PO TBCR: 10.0000 meq | EXTENDED_RELEASE_TABLET | Freq: Every day | ORAL | Status: DC

## 2014-10-23 MED ORDER — FLUTICASONE PROPIONATE 50 MCG/ACT NA SUSP
2.0000 | Freq: Every day | NASAL | Status: DC
  Administered 2014-10-23: 2 via NASAL
  Filled 2014-10-23: qty 16

## 2014-10-23 MED ORDER — FUROSEMIDE 40 MG PO TABS
40.0000 mg | ORAL_TABLET | Freq: Every day | ORAL | Status: DC
  Filled 2014-10-23: qty 1

## 2014-10-23 MED ORDER — FUROSEMIDE 10 MG/ML IJ SOLN
20.0000 mg | Freq: Once | INTRAMUSCULAR | Status: AC
  Administered 2014-10-23: 20 mg via INTRAVENOUS
  Filled 2014-10-23: qty 2

## 2014-10-23 MED ORDER — POTASSIUM CHLORIDE CRYS ER 20 MEQ PO TBCR
40.0000 meq | EXTENDED_RELEASE_TABLET | Freq: Once | ORAL | Status: AC
  Administered 2014-10-23: 40 meq via ORAL
  Filled 2014-10-23: qty 2

## 2014-10-23 MED ORDER — FLUTICASONE PROPIONATE 50 MCG/ACT NA SUSP: 2.0000 | Freq: Every day | NASAL | Status: DC

## 2014-10-23 MED ORDER — FUROSEMIDE 40 MG PO TABS: 40.0000 mg | ORAL_TABLET | Freq: Two times a day (BID) | ORAL | Status: DC

## 2014-10-23 MED ORDER — FUROSEMIDE 40 MG PO TABS: 40.0000 mg | ORAL_TABLET | Freq: Every day | ORAL | Status: DC

## 2014-10-23 MED ORDER — PREDNISONE 10 MG PO TABS: ORAL_TABLET | ORAL | Status: DC

## 2014-10-23 MED ORDER — PREDNISONE 20 MG PO TABS
40.0000 mg | ORAL_TABLET | Freq: Every day | ORAL | Status: DC
  Administered 2014-10-23: 40 mg via ORAL
  Filled 2014-10-23: qty 2

## 2014-10-23 NOTE — Consult Note (Signed)
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 >work up to date included: negative ace level & elevated sed rate. Admitted on 10/8 w/ approx 1 week h/o progressive dyspnea. No cough, fever, chills, no sick exposures. No chest pain. Has chronic LE edema. CT chest w/ diffuse interstitial fibrotic changes. PCCM asked to eval. On evaluation she denies: travel, pet dander exposure, bird exposure, mold or dust exposure, AC units functioning. No familial auto-immune diseases such as RA or SLE. Has chronic LE swelling.   STUDIES: /events ECHO: 10/9: EF 60-65%; no wall motion abnormality; grade I diastolic dysfxn.  CT chest 10/8: Extensive bilateral interstitial fibrotic changes with bronchiectasis and volume loss, consistent with the given history of interstitial pulmonary fibrosis. Mosaic attenuation of the lung parenchyma is suggestive of an element of superimposed obstructive pulmonary disease. Marked enlargement of the main pulmonary artery, which is suggestive of pulmonary arterial hypertension.   10/22/14: reports feeling near baseline currently. Says that ILD dx 2 years ago. Again denies mold, mildew, bird exposure. Denies known autoiommune disease. One year ago went on nocturnal o2. Several months ago needing continuous o2 - resting pulse ox on RA at home 84% per patient. STarted on daily steroids around then.  Autpimmune panel sent. Also, reports crhonic sinus and hoarse voice issues - never seen allergy, ENT   SUBJECTIVE/OVERNIGHT/INTERVAL HX 10/23/2014 - feels much much better after IV lasix. Says it helped the most since admit. Says still wheezing though. Feels she can go home. D/w rehab folks - who said 2 weeks ago  patient needed 10L with walking in rehab but patient says this was not baseline but a decompensation.  CT sinus normal. She is unable to afford [facility] 2nd oopinion s0 she was pre-arranged to see me anyways for ILD eval 11/27/14   VITAL SIGNS: Temp:  [98.2 F (36.8 C)-99.3 F (37.4 C)] 98.2 F (36.8 C) (10/11 0500) Pulse Rate:  [66-69] 66 (10/11 0500) Resp:  [16-18] 16 (10/11 0500) BP: (94-105)/(44-75) 100/45 mmHg (10/11 0500) SpO2:  [95 %-100 %] 95 % (10/11 0500)  PHYSICAL EXAMINATION: General:  74 year old white female currently resting while sitting at side of bed. Now in no distress . OBESE, SOMEWHAT CUSHINGOID Neuro:  Awake, oriented. No focal def  HEENT:  NCAT, voice hoarse. No JVD Cardiovascular:  rrr Lungs:  Dry posterior rales, no accessory muscle use . Some scattered wh3eeze + Abdomen:  Soft, non-tender + bowel sounds  Musculoskeletal:  Intact  Skin:  LE edema 2+  PULMONARY No results for input(s): PHART, PCO2ART, PO2ART, HCO3, TCO2, O2SAT in the last 168 hours.  Invalid input(s): PCO2, PO2  CBC  Recent Labs Lab 10/20/14 1612 10/21/14 1845  HGB 11.4* 11.4*  HCT 34.8* 34.8*  WBC 7.0 6.7  PLT 222 261    COAGULATION No results for input(s): INR in the last 168 hours.  CARDIAC  No results for input(s): TROPONINI in the last 168 hours. No results for input(s): PROBNP in the last 168 hours.   CHEMISTRY  Recent Labs Lab 10/20/14 1612 10/22/14 0355  NA 137 140  K 3.9 4.2  CL 98* 97*  CO2 32 36*  GLUCOSE 112* 116*  BUN 13  16  CREATININE 0.61 0.58  CALCIUM 8.6* 9.4  MG  --  2.2  PHOS  --  5.1*   Estimated Creatinine Clearance: 69.9 mL/min (by C-G formula based on Cr of 0.58).   LIVER No results for input(s): AST, ALT, ALKPHOS, BILITOT, PROT, ALBUMIN, INR in the last 168 hours.   INFECTIOUS No results for input(s): LATICACIDVEN, PROCALCITON in the last 168 hours.   ENDOCRINE CBG (last 3)  No results for input(s): GLUCAP in the last 72  hours.       IMAGING x48h  - image(s) personally visualized  -   highlighted in bold Ct Maxillofacial Ltd Wo Cm  10/22/2014   CLINICAL DATA:  Nasal congestion, shortness of breath due to pulmonary fibrosis, chronic sinusitis  EXAM: CT PARANASAL SINUS LIMITED WITHOUT CONTRAST  TECHNIQUE: Non-contiguous multidetector CT images of the paranasal sinuses were obtained in a single plane without contrast.  COMPARISON:  None.  FINDINGS: Visualized paranasal sinuses are clear.  Bilateral orbits, including the globes and retroconal soft tissues, are within normal limits.  The bilateral mandibular condyles are well-seated in the TMJs.  Visualized brain parenchyma is unremarkable.  IMPRESSION: Negative limited paranasal sinuses.   Electronically Signed   By: Charline Bills M.D.   On: 10/22/2014 14:20       ASSESSMENT / PLAN:  Acute on Chronic respiratory failure  - Baseline: She has ILD of unclear etiology. CT 2014 read by our thoracic rad as NOT c/w UIP. However, at age > 14 pre-test prob for UIP/IPF is high > 75% despite CT findings. Ddx is BOOP, CEP, NSIP, HP, Autoimmune   - Progression: clinically progressed chronically since diagnosis in fall 2014 based on history of increased o2 need esp last sveral months. In addition sh ehas had acute-sub acute worsening that is likely due to Acute Diastolic CHF (lasix helped her) +/- IPF flare    - Currently: acute worsening - possibly due to acute diastolic dysfn (edema +)  v ILD flare - unclear. More likelty the former given improvement 10/23/2014 after lasix 10/22/14  Other issues   - hx of chronic allergies - on singulair. Sinus CT 10/22/14 is normal   PLAN  - Hold singulair. STart Flonase  -Await  Autoimmune panel - 10/22/14 and 10/23/14  - change IV steroid to po prednisone - recommend tRiad taper it out over 2 weeks to baseline dose of  per day   - I will taper it fuirther in office. Unclear if chronic prednisone is giving her benefit. In  fact might have contributed to current diast dysfh  - Repeat IV lasix x 1 now  -Home on scheduled lasix  per day +/- kcl ([facility] to decide)  - Refer Dr Marca Ancona for right heart cath - d.w him    - I explaine rationale to patient about need to differentiate between different pulm htn and confirm.refute presence and postential safety need for future potential lung bx (she is afraid of "balloon" because some co-worker died in CCU after "balloon"). But she is willing to meet Dr Shirlee Latch  - As opd restage disease + will likely need right heart cath  - IF autopimmune results negative + right  Heart cath ok  + o2 needs minimal -> consider bronch BAL/surgical lung bx as opd - needs to be addressed later on / Not addressed now  - Long term needs weight loss (so best to get off prednisone if possible) +/- transplant eval  - address these in clinic /tomorrow  rounds. Not addressed now .     Home anytime  Followup  Future Appointments Date Time Provider Department Center  10/23/2014 1:30 PM [facility] None  10/25/2014 1:30 PM [facility] None  10/30/2014 1:30 PM [facility] None  11/01/2014 1:30 PM [facility] None  11/02/2014 10:45 AM Tammy S Parrett, NP [facility] None  11/06/2014 1:30 PM [facility] None  11/08/2014 1:30 PM [facility] None  11/13/2014 1:30 PM [facility] None  11/15/2014 1:30 PM [facility] None  11/20/2014 1:30 PM [facility] None  11/22/2014 1:30 PM [facility] None  11/27/2014 1:30 PM [facility] None  11/27/2014 2:45 PM Baruch Lewers, MD [facility] None  11/29/2014 1:30 PM [facility] None  12/04/2014 1:30 PM [facility] None   12/06/2014 1:30 PM [facility] None  12/11/2014 1:30 PM [facility] None  12/13/2014 1:30 PM [facility] None  12/18/2014 1:30 PM [facility] None  12/20/2014 1:30 PM [facility] None  12/25/2014 1:30 PM [facility] None  12/27/2014 1:30 PM [facility] None  01/01/2015 1:30 PM [facility] None  01/03/2015 1:30 PM [facility] None  01/08/2015 1:30 PM [facility] None  01/10/2015 1:30 PM [facility] None  01/15/2015 1:30 PM [facility] None  01/17/2015 1:30 PM [facility] None  01/22/2015 1:30 PM [facility] None  01/24/2015 1:30 PM [facility] None  01/29/2015 1:30 PM [facility] None  01/31/2015 1:30 PM [facility] None  02/05/2015 1:30 PM [facility] None  02/07/2015 1:30 PM [facility] None  02/12/2015 1:30 PM [facility] None  02/14/2015 1:30 PM [facility] None      Dr. Kalman Shan, M.D., F.C.C.P Pulmonary and Critical Care Medicine Staff Physician [facility] Pager: [phone], If no answer or between  15:00h - 7:00h: call [phone]  10/23/2014 8:42 AM

## 2014-10-23 NOTE — Care Management Important Message (Signed)
Important Message  Patient Details  Name: Miranda Hines MRN: [id number] Date of Birth: [birth year]   Medicare Important Message Given:  Yes-second notification given    Orson Aloe 10/23/2014, 11:23 AM

## 2014-10-23 NOTE — Telephone Encounter (Signed)
Miranda Hines  Please evaluate Miranda Hines for non-urgent right cath .  As discussed 10/23/2014   Thanks  Dr. Kalman Shan, M.D., F.C.C.P Pulmonary and Critical Care Medicine Staff Physician [place] Pager: [phone], If no answer or between  15:00h - 7:00h: call [phone]  10/23/2014 8:52 AM

## 2014-10-23 NOTE — Telephone Encounter (Signed)
Heather/Kimberly: Please arrange appointment with me this week or next, need to set up R[facility].

## 2014-10-23 NOTE — Care Management Note (Signed)
Case Management Note  Patient Details  Name: Miranda Hines MRN: [id number] Date of Birth: April 06, 1940  Subjective/Objective:                    Action/Plan:   Expected Discharge Date:                  Expected Discharge Plan:  Home/Self Care  In-House Referral:     Discharge planning Services     Post Acute Care Choice:    Choice offered to:     DME Arranged:    DME Agency:     HH Arranged:    HH Agency:     Status of Service:  Completed, signed off  Medicare Important Message Given:  Yes-second notification given Date Medicare IM Given:    Medicare IM give by:    Date Additional Medicare IM Given:    Additional Medicare Important Message give by:     If discussed at Long Length of Stay Meetings, dates discussed:    Additional Comments:  Kingsley Plan, RN 10/23/2014, 11:57 AM

## 2014-10-23 NOTE — Discharge Summary (Signed)
Physician Discharge Summary  Miranda Hines ZOX:[id number] DOB: January 30, 1940 DOA: 10/20/2014  PCP: Cain Saupe, MD  Admit date: 10/20/2014 Discharge date: 10/23/2014  Time spent: 45 minutes  Recommendations for Outpatient Follow-up:  Patient will be discharged to home.  Patient will need to follow up with primary care provider within one week of discharge, repeat BMP in one week.  Patient has a follow up with pulmonology at the specified times. Patient should continue medications as prescribed.  Patient should follow a heart healthy diet.   Discharge Diagnoses:  Acute on chronic respiratory failure secondary to interstitial pulmonary fibrosis versus heart failure Acute diastolic heart failure exacerbation GERD Chronic allergies  Discharge Condition: Stable  Diet recommendation: Heart healthy  Filed Weights   10/20/14 2228  Weight: 100.7 kg (222 lb 0.1 oz)    History of present illness:  on 10/20/2014 by Dr. Lyda Perone Miranda Hines is a 74 y.o. female with h/o interstitial pulmonary fibrosis. Patient presents to the ED with worsening SOB and hypoxia. Normally 2L O2 at rest and 4L while ambulating. For past week has had increasing SOB especially with exertion. She has had to increase her home O2 to meet demand. She has no new cough, fevers, chills. No h/o DVT/PE.  Hospital Course:  Acute on chronic respiratory failure secondary to interstitial pulmonary fibrosis vs DCHF -CTA chest: Interstitial pulmonary fibrosis with possible superimposed active pulmonary disease, pulmonary arterial hypertension -CXR negative for infection -Pulmonology consultation appreciated -Echocardiogram: EF 60-65%, grade 1 diastolic dysfunction, PA pressure 25 (nromal) -BNP 88  -Continue supplemental oxygen -Given findings of CT chest and Echo, have asked cardiology to reevaluate echocardiogram -Pending autoimmmue workup -Continue nebs, lasix -Continue prednisone taper -Pulm  ordered CT maxillofacial: negative -Patient has follow up appointments with pulmonology  Acute diastolic heart failure exacerbation -Echocardiogram as above -Patient's breathing improved with lasix -pulmonology would like RHC as an outpatient -Will discharge with lasix and potassium supplementation -Repeat BMP in one week  GERD -Continue PPI and pepcid  Chronic allergies  -With sinus congestion -Continue flonase  Procedures: Echocardiogram  Consultations: PCCM  Discharge Exam: Filed Vitals:   10/23/14 0500  BP: 100/45  Pulse: 66  Temp: 98.2 F (36.8 C)  Resp: 16   Exam  General: Well developed, well nourished, NAD  HEENT: NCAT, mucous membranes moist.   Cardiovascular: S1 S2 auscultated, RRR, no murmurs  Respiratory: Dry cough, dry rales/crackles, +scattered wheezing  Abdomen: Soft, obese, nontender, nondistended, + bowel sounds  Extremities: warm dry without cyanosis clubbing. 2+LE edema L>R, improving.  Neuro: AAOx3, nonfocal  Psych: Normal affect and demeanor, pleasant  Discharge Instructions      Discharge Instructions    Discharge instructions    Complete by:  As directed   Patient will be discharged to home.  Patient will need to follow up with primary care provider within one week of discharge, repeat BMP in one week.  Patient has a follow up with pulmonology at the specified times. Patient should continue medications as prescribed.  Patient should follow a regular diet.            Medication List    STOP taking these medications        phenylephrine 10 MG Tabs tablet  Commonly known as:  SUDAFED PE      TAKE these medications        aspirin 81 MG tablet  Take 81 mg by mouth daily.     famotidine 20 MG tablet  Commonly known as:  PEPCID  Take 1 tablet (20 mg total) by mouth at bedtime.     fluticasone 50 MCG/ACT nasal spray  Commonly known as:  FLONASE  Place 2 sprays into both nostrils daily.     furosemide 40 MG tablet    Commonly known as:  LASIX  Take 1 tablet (40 mg total) by mouth daily.     guaiFENesin 600 MG 12 hr tablet  Commonly known as:  MUCINEX  Take 1,200 mg by mouth as needed for congestion.     montelukast 10 MG tablet  Commonly known as:  SINGULAIR  take 1 tablet by mouth at bedtime     pantoprazole 40 MG tablet  Commonly known as:  PROTONIX  TAKE 1 TABLET BY MOUTH 30 TO 60 MINUTES BEFORE FIRST MEAL OF THE DAY     potassium chloride 10 MEQ tablet  Commonly known as:  K-DUR  Take 1 tablet (10 mEq total) by mouth daily.     predniSONE 10 MG tablet  Commonly known as:  DELTASONE  Take  (4 tabs) x 4 days, then  (3 tabs) x 4 days, then  (2 tabs) x 4 days, then  (1 tab) 4 days.     SIMPLY SALINE 0.9 % Aers  Generic drug:  Saline  Place 1 spray into the nose daily as needed (allergies).       Allergies  Allergen Reactions  . Penicillins Swelling   Follow-up Information    Follow up with FULP, [name], MD. Schedule an appointment as soon as possible for a visit in 1 week.   Specialty:  Family Medicine   Why:  Hospital follow up, repeat BMP   Contact information:   [address] [place] [address] [phone]       Follow up with [facility], NP. Go on 11/02/2014.   Specialty:  Pulmonary Disease   Why:  at 10:45   Contact information:   [address] [place] [address] [phone]        The results of significant diagnostics from this hospitalization (including imaging, microbiology, ancillary and laboratory) are listed below for reference.    Significant Diagnostic Studies: Dg Chest 2 View  10/20/2014   CLINICAL DATA:  Short of breath, 2 days duration. Chronic lung disease. Low oxygen saturation.  EXAM: CHEST  2 VIEW  COMPARISON:  10/06/2013  FINDINGS: Heart size is unchanged. Atherosclerosis of the aorta again seen. Widespread pulmonary fibrotic pattern is unchanged from the study 10/06/2013. No area of dense consolidation or  collapse. No effusion. Certainly, acute inflammatory change could be hidden amongst the extensive chronic fibrotic changes.  IMPRESSION: No change. Chronic and advanced widespread pulmonary fibrosis. No sign of acute infiltrate or collapse. However, due to the extensive fibrotic changes, acute inflammation could be hidden.   Electronically Signed   By: Paulina Fusi M.D.   On: 10/20/2014 17:39   Ct Angio Chest Pe W/cm &/or Wo Cm  10/20/2014   CLINICAL DATA:  Patient with interstitial pulmonary fibrosis with chronic oxygen dependence, recently increased shortness of breath. Worsened hypoxia for the last 4-5 days.  EXAM: CT ANGIOGRAPHY CHEST WITH CONTRAST  TECHNIQUE: Multidetector CT imaging of the chest was performed using the standard protocol during bolus administration of intravenous contrast. Multiplanar CT image reconstructions and MIPs were obtained to evaluate the vascular anatomy.  CONTRAST:  OMNIPAQUE IOHEXOL 350 MG/ML SOLN  COMPARISON:  Chest radiograph dated 10/20/2014  FINDINGS: There is no evidence of pulmonary embolus.  There  is no aortic dissection. The aorta is normal in caliber. The main pulmonary artery measures 4.3 cm and is larger than the ascending aorta, suggestive of pulmonary arterial hypertension.  There is no evidence of mediastinal lymphadenopathy. No axillary lymphadenopathy is seen. The visualized portions of the thyroid gland are unremarkable in appearance.  There are patchy widespread bilateral interstitial changes, with volume loss and bronchiectasis. Mosaic attenuation of the lung parenchyma is also seen. No evidence of pleural effusions. No evidence of pneumothorax.  The visualized portions of the liver and spleen are unremarkable.  The visualized portions of the pancreas, gallbladder, stomach, adrenal glands are within normal limits. There are bilateral nonobstructing renal calculi. There is a 2 cm exophytic simple appearing cyst off of the midpole of the left kidney.  No  acute osseous abnormalities are seen.  Review of the MIP images confirms the above findings.  IMPRESSION: Extensive bilateral interstitial fibrotic changes with bronchiectasis and volume loss, consistent with the given history of interstitial pulmonary fibrosis. Mosaic attenuation of the lung parenchyma is suggestive of an element of superimposed obstructive pulmonary disease.  Marked enlargement of the main pulmonary artery, which is suggestive of pulmonary arterial hypertension.  Bilateral nonobstructing renal calculi.  Simple appearing left renal cyst.   Electronically Signed   By: Ted Mcalpine M.D.   On: 10/20/2014 19:43   Ct Maxillofacial Ltd Wo Cm  10/22/2014   CLINICAL DATA:  Nasal congestion, shortness of breath due to pulmonary fibrosis, chronic sinusitis  EXAM: CT PARANASAL SINUS LIMITED WITHOUT CONTRAST  TECHNIQUE: Non-contiguous multidetector CT images of the paranasal sinuses were obtained in a single plane without contrast.  COMPARISON:  None.  FINDINGS: Visualized paranasal sinuses are clear.  Bilateral orbits, including the globes and retroconal soft tissues, are within normal limits.  The bilateral mandibular condyles are well-seated in the TMJs.  Visualized brain parenchyma is unremarkable.  IMPRESSION: Negative limited paranasal sinuses.   Electronically Signed   By: Charline Bills M.D.   On: 10/22/2014 14:20    Microbiology: No results found for this or any previous visit (from the past 240 hour(s)).   Labs: Basic Metabolic Panel:  Recent Labs Lab 10/20/14 1612 10/22/14 0355  NA 137 140  K 3.9 4.2  CL 98* 97*  CO2 32 36*  GLUCOSE 112* 116*  BUN 13 16  CREATININE 0.61 0.58  CALCIUM 8.6* 9.4  MG  --  2.2  PHOS  --  5.1*   Liver Function Tests: No results for input(s): AST, ALT, ALKPHOS, BILITOT, PROT, ALBUMIN in the last 168 hours. No results for input(s): LIPASE, AMYLASE in the last 168 hours. No results for input(s): AMMONIA in the last 168  hours. CBC:  Recent Labs Lab 10/20/14 1612 10/21/14 1845  WBC 7.0 6.7  NEUTROABS  --  6.1  HGB 11.4* 11.4*  HCT 34.8* 34.8*  MCV 103.3* 101.8*  PLT 222 261   Cardiac Enzymes:  Recent Labs Lab 10/23/14 0405  CKTOTAL 41  CKMB 1.4   BNP: BNP (last 3 results)  Recent Labs  10/20/14 1730  BNP 88.5    ProBNP (last 3 results)  Recent Labs  03/30/14 1705  PROBNP 132.0*    CBG: No results for input(s): GLUCAP in the last 168 hours.     SignedEdsel Petrin  [facility] 10/23/2014, 9:57 AM

## 2014-10-23 NOTE — Discharge Instructions (Signed)
Acute Respiratory Distress Syndrome °Acute respiratory distress syndrome is a life-threatening condition in which fluid collects in the lungs. This condition can cause severe shortness of breath and low oxygen levels in the blood. It can also cause the lungs and other vital organs to fail. The condition usually develops within 24 to 48 hours of an infection, illness, surgery, or injury. °CAUSES °This condition may be caused by: °· An infection, such as sepsis or pneumonia. °· A serious injury to the head or chest. °· A major surgery. °· A drug overdose. °· Breathing in harmful chemicals or smoke. °· Blood transfusions. °· A blood clot in the lungs. °SYMPTOMS °Symptoms of this condition include: °· Fever. °· Difficulty breathing. °· Bluish skin (cyanosis). °· A fast or irregular heartbeat. °· Low blood pressure (hypotension). °· Agitation. °· Confusion. °· Lack of energy (lethargy). °· Sweating. °DIAGNOSIS °This condition may be diagnosed by using tests to rule out other diseases and conditions that cause similar symptoms. You may have: °· A chest X-ray or CT scan. °· Blood tests. °· A sputum culture. In this test, you will be asked to spit so that a sample of lung fluid can be taken for testing. °· Bronchoscopy. During this test a thin, flexible tool is passed into the mouth or nose, down the windpipe, and into the lungs. °TREATMENT °This condition may be treated with: °· Oxygen. A breathing machine (ventilator) is often used to provide oxygen and help with breathing. °· Medicine to help you relax (sedative). °· Fluids and nutrients given through an IV tube. °· Blood pressure medicine. °· Steroid medicine to help decrease inflammation in the lungs. °· Diuretic medicine to get rid of extra fluid in the body. °Additional treatment may be needed, depending on the cause of the condition. It can take up to 12 months to recover from this condition. Some people recover fully, but others may continue to  have: °· Weakness. °· Shortness of breath. °· Memory problems. °· Depression. °HOME CARE INSTRUCTIONS °Until you recover from this condition: °· Do not smoke. °· Limit alcohol intake to no more than 1 drink per day for nonpregnant women and 2 drinks per day for men. One drink equals 12 oz of beer, 5 oz of wine, or 1½ oz of hard liquor. °· Ask friends and family to help you if daily activities make you tired. °SEEK MEDICAL CARE IF: °· You become short of breath with activity or while resting. °· You develop a cough that does not go away. °· You have a fever. °SEEK IMMEDIATE MEDICAL CARE IF: °· You have sudden shortness of breath. °· You develop chest pain that does not go away. °· You develop swelling or pain in one of your legs. °· You cough up blood. °  °This information is not intended to replace advice given to you by your health care provider. Make sure you discuss any questions you have with your health care provider. °  °Document Released: 12/29/2004 Document Revised: 05/15/2014 Document Reviewed: 12/25/2013 °Elsevier Interactive Patient Education ©2016 [employer] ° °

## 2014-10-24 ENCOUNTER — Telehealth (HOSPITAL_COMMUNITY): Payer: Self-pay | Admitting: Vascular Surgery

## 2014-10-24 LAB — SJOGRENS SYNDROME-A EXTRACTABLE NUCLEAR ANTIBODY: SSA (Ro) (ENA) Antibody, IgG: <0.2 AI (ref 0.0–0.9)

## 2014-10-24 LAB — HYPERSENSITIVITY PNEUMONITIS
A. FUMIGATUS #1 ABS: NEGATIVE
A. Pullulans Abs: NEGATIVE
MICROPOLYSPORA FAENI IGG: NEGATIVE
PIGEON SERUM ABS: NEGATIVE
THERMOACT. SACCHARII: NEGATIVE
THERMOACTINOMYCES VULGARIS IGG: NEGATIVE

## 2014-10-24 LAB — ALDOLASE: Aldolase: 10.1 U/L (ref 3.3–10.3)

## 2014-10-24 LAB — CYCLIC CITRUL PEPTIDE ANTIBODY, IGG/IGA: CCP ANTIBODIES IGG/IGA: 9 U (ref 0–19)

## 2014-10-24 LAB — ANTI-SCLERODERMA ANTIBODY

## 2014-10-24 LAB — SJOGRENS SYNDROME-B EXTRACTABLE NUCLEAR ANTIBODY: SSB (La) (ENA) Antibody, IgG: 2.7 AI — ABNORMAL HIGH (ref 0.0–0.9)

## 2014-10-24 NOTE — Telephone Encounter (Signed)
Left pt a message to make appt w/ [facility] this week

## 2014-10-25 ENCOUNTER — Telehealth (HOSPITAL_COMMUNITY): Payer: Self-pay | Admitting: Vascular Surgery

## 2014-10-25 ENCOUNTER — Encounter (HOSPITAL_COMMUNITY): Payer: Medicare Other

## 2014-10-25 NOTE — Telephone Encounter (Signed)
Left 2 nd message to get pt a new pt appt w/ [facility] this week or next week

## 2014-10-25 NOTE — Telephone Encounter (Signed)
appt sch 10/17

## 2014-10-29 ENCOUNTER — Ambulatory Visit (HOSPITAL_COMMUNITY)
Admission: RE | Admit: 2014-10-29 | Discharge: 2014-10-29 | Disposition: A | Discharge status: Home / Self Care | Payer: Medicare Other | Source: Ambulatory Visit | Attending: Cardiology | Admitting: Cardiology

## 2014-10-29 ENCOUNTER — Encounter (HOSPITAL_COMMUNITY): Payer: Self-pay | Admitting: *Deleted

## 2014-10-29 ENCOUNTER — Encounter (HOSPITAL_COMMUNITY): Payer: Self-pay

## 2014-10-29 VITALS — BP 108/58 | HR 75 | Ht 61.5 in | Wt 219.8 lb

## 2014-10-29 DIAGNOSIS — Z9981 Dependence on supplemental oxygen: Secondary | ICD-10-CM | POA: Diagnosis not present

## 2014-10-29 DIAGNOSIS — J849 Interstitial pulmonary disease, unspecified: Secondary | ICD-10-CM | POA: Diagnosis not present

## 2014-10-29 DIAGNOSIS — J8410 Pulmonary fibrosis, unspecified: Secondary | ICD-10-CM | POA: Diagnosis not present

## 2014-10-29 DIAGNOSIS — Z79899 Other long term (current) drug therapy: Secondary | ICD-10-CM | POA: Insufficient documentation

## 2014-10-29 DIAGNOSIS — I272 Other secondary pulmonary hypertension: Secondary | ICD-10-CM

## 2014-10-29 DIAGNOSIS — J9612 Chronic respiratory failure with hypercapnia: Secondary | ICD-10-CM | POA: Diagnosis not present

## 2014-10-29 DIAGNOSIS — Z7982 Long term (current) use of aspirin: Secondary | ICD-10-CM | POA: Diagnosis not present

## 2014-10-29 DIAGNOSIS — Z825 Family history of asthma and other chronic lower respiratory diseases: Secondary | ICD-10-CM | POA: Diagnosis not present

## 2014-10-29 DIAGNOSIS — R0600 Dyspnea, unspecified: Secondary | ICD-10-CM

## 2014-10-29 LAB — CBC
HEMATOCRIT: 37.5 % (ref 36.0–46.0)
HEMOGLOBIN: 12 g/dL (ref 12.0–15.0)
MCH: 33.1 pg (ref 26.0–34.0)
MCHC: 32 g/dL (ref 30.0–36.0)
MCV: 103.3 fL — ABNORMAL HIGH (ref 78.0–100.0)
Platelets: 206 10*3/uL (ref 150–400)
RBC: 3.63 MIL/uL — AB (ref 3.87–5.11)
RDW: 14.5 % (ref 11.5–15.5)
WBC: 11.3 10*3/uL — AB (ref 4.0–10.5)

## 2014-10-29 LAB — BASIC METABOLIC PANEL
ANION GAP: 6 (ref 5–15)
BUN: 15 mg/dL (ref 6–20)
CALCIUM: 8.8 mg/dL — AB (ref 8.9–10.3)
CO2: 38 mmol/L — ABNORMAL HIGH (ref 22–32)
Chloride: 97 mmol/L — ABNORMAL LOW (ref 101–111)
Creatinine, Ser: 0.62 mg/dL (ref 0.44–1.00)
GLUCOSE: 102 mg/dL — AB (ref 65–99)
POTASSIUM: 4.1 mmol/L (ref 3.5–5.1)
Sodium: 141 mmol/L (ref 135–145)

## 2014-10-29 LAB — PROTIME-INR
INR: 0.95 (ref 0.00–1.49)
PROTHROMBIN TIME: 12.9 s (ref 11.6–15.2)

## 2014-10-29 NOTE — Patient Instructions (Signed)
Continue current medications  Right Heart Cath for next week

## 2014-10-30 ENCOUNTER — Other Ambulatory Visit (HOSPITAL_COMMUNITY): Payer: Self-pay | Admitting: *Deleted

## 2014-10-30 ENCOUNTER — Encounter (HOSPITAL_COMMUNITY): Payer: Medicare Other

## 2014-10-30 DIAGNOSIS — I272 Pulmonary hypertension, unspecified: Secondary | ICD-10-CM

## 2014-10-30 NOTE — Progress Notes (Signed)
Patient ID: Miranda Hines, female   DOB: September 21, 1940, 74 y.o.   MRN: [id number] PCP: Dr. Chapman Fitch Pulmonology: Dr. Chase Caller Cardiology: Dr. Aundra Dubin  74 yo with history of interstitial lung disease presents for evaluation for pulmonary hypertension.  Patient has had a diagnosis of interstitial lung disease since 2013.  She has marked fibrotic changes on chest CT and restrictive PFTs.  Cause for ILD is not certain.  Skin biopsy was negative for sarcoidosis.  Possible diagnoses include UIP, hypersensitivity pneumonitis, and post-infectious fibrosis.  Serological workup was not particularly revealing (see below).  She had an echo with normal-appearing RV and normal estimation of PA pressure, but CT chest showed a markedly enlarged PA that is concerning for pulmonary hypertension.    Patient was admitted with progressive dyspnea in 10/16.  She was treated with steroids.  Steroids have helped her symptoms.  She is oxygen dependent (4L with exertion, 2 L at rest).  She is not short of breath in the house.  She is not particularly short of breath walking short distances as long as she is using oxygen.  She fatigues easily.  No chest pain, lightheadedness, or syncope.  Rare palpitations.    Labs (10/16): CCP negative, SCL-70 negative, SSA negative, SSB positive, dsDNA negative, ANCA negative, RF negative, HSP negative, BNP 89, K 4.2, creatinine 0.5, ESR 84, ACE negative.   ECG: NSR, right axis deviation, low voltage.   PMH: 1. Interstitial lung disease:  Diagnosed summer 2013.  Initial concern for sarcoid but not confirmed, skin biopsy was negative.  ?Hypersensitivity pneumonitis versus post-inflammatory fibrosis versus UIP versus sarcoidosis.  CT chest 10/16 with bilateral fibrotic changes, marked enlargement of the main PA.  PFTs (12/14) with FVC 33%, FEV1 44%, ratio 130%, DLCO 42%, TLC 45% => severe restrictive lung disease . 2. ?Pulmonary hypertension: Echo (10/16) with EF 60-65%, mild LVH, PA systolic  pressure only 25 mmHg, RV normal.  However, CT chest in 10/16 showed marked enlargement of the main PA.   SH: Married, husband in poor health.  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    FH: Brother and mother with COPD, no significant cardiac history.   ROS: All systems reviewed and negative except as per HPI.   Current Outpatient Prescriptions  Medication Sig Dispense Refill  . aspirin 81 MG tablet Take 81 mg by mouth daily.    . famotidine (PEPCID) 20 MG tablet Take 1 tablet (20 mg total) by mouth at bedtime. 90 tablet 3  . fluticasone (FLONASE) 50 MCG/ACT nasal spray Place 2 sprays into both nostrils daily. 16 g 0  . furosemide (LASIX) 40 MG tablet Take 1 tablet (40 mg total) by mouth daily. 30 tablet 0  . guaiFENesin (MUCINEX) 600 MG 12 hr tablet Take 1,200 mg by mouth as needed for congestion.    . pantoprazole (PROTONIX) 40 MG tablet TAKE 1 TABLET BY MOUTH 30 TO 60 MINUTES BEFORE FIRST MEAL OF THE DAY 90 tablet 1  . potassium chloride (K-DUR) 10 MEQ tablet Take 1 tablet (10 mEq total) by mouth daily. 30 tablet 0  . predniSONE (DELTASONE) 10 MG tablet Take 71m (4 tabs) x 4 days, then 328m(3 tabs) x 4 days, then 2016m2 tabs) x 4 days, then 36m42m tab) 4 days. 40 tablet 0  . Saline (SIMPLY SALINE) 0.9 % AERS Place 1 spray into the nose daily as needed (allergies).      No current facility-administered medications for this encounter.   BP 108/58 mmHg  Pulse 75  Ht 5' 1.5" (1.562 m)  Wt 219 lb 12.8 oz (99.701 kg)  BMI 40.86 kg/m2  SpO2 95% General: NAD Neck: JVP 8-9 cm, no thyromegaly or thyroid nodule.  Lungs: Dry crackles at bases bilaterally.   CV: Nondisplaced PMI.  Heart regular S1/S2 with loud P2, no S3/S4, no murmur.  1+ ankle edema.  No carotid bruit.  Normal pedal pulses.  Abdomen: Soft, nontender, no hepatosplenomegaly, no distention.  Skin: Intact without lesions or rashes.  Neurologic: Alert and oriented x 3.  Psych: Normal affect. Extremities:  +Clubbing, no cyanosis.   HEENT: Normal.   Assessment/Plan: 1. Interstitial lung disease: Marked fibrotic changes on CT chest and restrictive PFTs.  Cause is not yet determined, may end up being UIP.  She is on home oxygen.  She has been steroid-responsive.  2. ?Pulmonary hypertension: Patient may have pulmonary hypertension.  Her echo appeared to show a normal RV and estimation of PA pressure was not elevated, but she had a CT chest recently with a markedly dilated PA.  She has a loud P2 on exam and she has mildly elevated JVP.  I think that we need to determine whether or not she has significant pulmonary arterial hypertension. - I will arrange for RHC.  We discussed risks/benefits of the procedure and she agrees to proceed.  - I will continue the same dose of Lasix for now.  However, I suspect that I may increase it based on the RHC findings (JVD on exam).    Loralie Champagne 10/30/2014

## 2014-11-01 ENCOUNTER — Encounter (HOSPITAL_COMMUNITY): Payer: Medicare Other

## 2014-11-01 ENCOUNTER — Encounter (HOSPITAL_COMMUNITY)
Admission: RE | Admit: 2014-11-01 | Discharge: 2014-11-01 | Disposition: A | Discharge status: Home / Self Care | Payer: Medicare Other | Source: Ambulatory Visit | Attending: Internal Medicine | Admitting: Internal Medicine

## 2014-11-01 DIAGNOSIS — J849 Interstitial pulmonary disease, unspecified: Secondary | ICD-10-CM | POA: Diagnosis not present

## 2014-11-01 NOTE — Progress Notes (Signed)
Miranda Hines completed a Six-Minute Walk Test on 11/01/14 . Miranda Hines walked 767 feet with 2 standing rest breaks lasting 1 minute and 15 seconds.  The patient's lowest oxygen saturation was 82 %, highest heart rate was 122 bpm , and highest blood pressure was 168/80. The patient was on 4 liters of oxygen with a nasal cannula to start off with and then titrated to 15 liters to achieve an oxygen saturation of 89% Patient stated that nothing hindered their walk test.

## 2014-11-02 ENCOUNTER — Ambulatory Visit (INDEPENDENT_AMBULATORY_CARE_PROVIDER_SITE_OTHER): Payer: Medicare Other | Admitting: Adult Health

## 2014-11-02 ENCOUNTER — Encounter: Payer: Self-pay | Admitting: Adult Health

## 2014-11-02 VITALS — BP 128/78 | HR 73 | Temp 99.2°F | Ht 61.5 in | Wt 215.2 lb

## 2014-11-02 DIAGNOSIS — J9612 Chronic respiratory failure with hypercapnia: Secondary | ICD-10-CM

## 2014-11-02 DIAGNOSIS — J8410 Pulmonary fibrosis, unspecified: Secondary | ICD-10-CM

## 2014-11-02 DIAGNOSIS — I272 Other secondary pulmonary hypertension: Secondary | ICD-10-CM

## 2014-11-02 DIAGNOSIS — R0600 Dyspnea, unspecified: Secondary | ICD-10-CM

## 2014-11-02 NOTE — Assessment & Plan Note (Signed)
Follow-up for right heart catheter. Next week to evaluate for underlying pulmonary hypertension

## 2014-11-02 NOTE — Progress Notes (Signed)
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 2012 p "bad cold"  more doe then   Acute onset cough summer 2013 p exposure to cleaning agents  persistent cough and sob with exertion > adls.  Cough is dry and mostly daytime and assoc with constant sensation of pnds. rec Pantoprazole (protonix) 40 mg   Take 30-60 min before first meal of the day and Pepcid 20 mg one bedtime until return to office - this is the best way to tell whether stomach acid is contributing to your problem.   GERD diet      04/03/2013 f/u ov/Wert re:  ?ILD  Chief Complaint  Patient presents with  . Follow-up    Pt states her SOB and cough are much improved since last visit. No new co's today.   rec Ok to zyrtec 10 mg one daily as needed for nasal drainage  Return 1st week in Kimiye 2015 for pft and cxr > did not return as requested     03/30/2014 f/u ov/Wert re: PF/ rash ? Could this be sarcoid?  Chief Complaint  Patient presents with  . Follow-up    Last seen 03/16/14. States feeling well. Denies SOB, wheezing, chest pain or tightness, or cough    in general sitting still RA s 02 88-90% at home per pt monitoring  Walking 02 2lpm x 5 trips on 02 to mailbox and back  Sleep on 2lpm  rec Ok to adjust your 02 for sats > 90% daytime but always use the 02 2lpm at bedtime Please remember to go to the lab  department downstairs for your tests - we will call you with the results when they are available. Prednisone 10 mg Take  4 for three days 3 for three days 2 for three days 1 for three days and stop  Please see patient coordinator before you leave today  to schedule dermatology eval asap > dx was venous stasis dermatitis   04/27/2014 f/u ov/Wert re: pf/ chronic resp failure/ steroid responsive / not adjusting 02 up as rec  Chief Complaint  Patient presents with  . Follow-up    Pt states that her breathing was doing great until stopped pred 2 wks ago.  She was SOB walking from her car to our building today.   Only does well with breathing while on prednisone.  rec Prednisone 10 mg take  4 each am x 2 days,   2 each am x 2 days,  1 each am x 2 days and stop        05/25/2014 f/u ov/Wert re: steroid responsive PF/02 dep Resp failure / not using 02 as rec  Chief Complaint  Patient presents with  . Follow-up    Breathing has improved some. She is having some cough- prod with clear, thick sputum.   much better breathing /cough while on prednisone though really can't get a consistent response re doe on pred vs  cough or on 02 vs off  Last called in 6 days of pred on 05/15/14  rec Prednisone 10 mg daily - if getting worse double the dose until better, if getting better reduce the dose to 5 mg daily  Increase 02 to 4lpm with activity, reduce the dose down to 2lpm at rest and sleeping. > goal is keep above 90% at all times   08/28/2014 f/u ov/Wert re: pf with steroid dep component since 05/25/14 still on 20 mg daily  Chief Complaint  Patient presents with  . Follow-up    Pt states breathing has been better since last visit. Cough is almost resolved    >>Pred 64m   11/02/2014 Post hospital follow up  Patient returns for a post hospital follow-up. Patient was recently admitted for acute on chronic hypoxic respiratory failure. She has been followed by Dr. WMelvyn Novasfor steroid responsive interstitial lung disease. Patient had been having progressive shortness of breath and worsening hypoxemia despite oxygen prior to  admission.  She was seen for pulmonary consultation.  CT chest showed diffuse interstitial fibrotic changes with bronchiectasis and volume loss. There was market enlargement of the main pulmonary artery. Autoimmune workup was negative. 2-D echo showed a normal ejection fracture at 627-06% grade 1 diastolic dysfunction. Pulmonary artery pressure at 25. She was treated with aggressive IV steroids. She did require diuresis for suspected acute decompensated diastolic heart failure.  Patient has been set up for an evaluation with Dr. MAundra Dubinfor right heart catheter to evaluate for pulmonary hypertension  scheduled for next week.  Since discharge. Patient is feeling some improvement.  She remains on 2 L of oxygen at rest and 4 L of oxygen with walking.. Walk test in office did show adequate oxygenation with walking on 4 L Patient does require higher flow oxygen and pulmonary rehabilitation with exercise requiring up to 15 L to keep oxygen levels above 89%. In our office, we did use the oximetry forehead probe instead of the finger probe.  Current Medications, Allergies, Complete Past Medical History, Past Surgical History, Family History, and Social History were reviewed in [employer].  ROS  The following are not active complaints unless bolded sore throat, dysphagia, dental problems, itching, sneezing,  nasal congestion or excess/ purulent secretions, ear ache,   fever, chills, sweats, unintended wt loss, pleuritic or exertional cp, hemoptysis,  orthopnea pnd or , presyncope, palpitations, heartburn, abdominal pain, anorexia, nausea, vomiting, diarrhea  or change in bowel or urinary habits, change in stools or urine, dysuria,hematuria,   arthralgias, visual complaints, headache, numbness weakness or ataxia or problems with walking or coordination,  change in mood/affect or memory.                Objective:   Physical Exam    amb obese wf nad on 4lpm   04/03/2013         234 > 10/06/2013  231 > 03/02/2014   224> 03/16/2014  248 > 03/30/2014  228 > 04/27/2014   227 > 05/25/2014   222> 08/28/2014   219  > 215 11/02/2014           HEENT: nl dentition, turbinates, and orophanx. Nl external ear canals without cough reflex   NECK :  without JVD/Nodes/TM/ nl carotid upstrokes bilaterally   LUNGS: no acc muscle use, bibasilar insp crackles s cough    CV:  RRR  no s3 or murmur or increase in P2,  Trace-1+  sym pitting bilateral lower ext  edema   ABD:  soft and nontender with nl excursion in the supine position. No bruits or organomegaly, bowel sounds nl  MS:  warm without deformities, calf tenderness, cyanosis - mild/moderate clubbing  SKIN: warm and dry /  Venous stasis changes both lower extremities        CXR:  10/06/13 Extensive pulmonary fibrosis without acute disease.   08/28/2014 labs ordered: esr but did not go as requested    Assessment & Plan:

## 2014-11-02 NOTE — Patient Instructions (Signed)
Continue on Oxygen 2l/m at rest and 4l/m with walking -continuous flow only. May need to increased more at pulmonary rehab to keep oxygen level up  Goal is to keep oxygen level >88-90%.  follow up for Right heart cath this week.  follow up Dr. Ramaswamy in 3 weeks and As needed   Set up PFT prior to this visit     

## 2014-11-02 NOTE — Assessment & Plan Note (Signed)
Continue on Oxygen 2l/m at rest and 4l/m with walking -continuous flow only. May need to increased more at pulmonary rehab to keep oxygen level up  Goal is to keep oxygen level >88-90%.  follow up for Right heart cath this week.  follow up Dr. Marchelle Gearingamaswamy in 3 weeks and As needed   Set up PFT prior to this visit

## 2014-11-02 NOTE — Assessment & Plan Note (Signed)
Patient is to continue on prednisone taper as directed. Continue on oxygen to keep O2 saturations greater than 88-90%. Follow-up pulmonary function test in 4 weeks.

## 2014-11-06 ENCOUNTER — Encounter (HOSPITAL_COMMUNITY): Payer: Medicare Other

## 2014-11-08 ENCOUNTER — Encounter (HOSPITAL_COMMUNITY): Payer: Self-pay | Admitting: Cardiology

## 2014-11-08 ENCOUNTER — Encounter (HOSPITAL_COMMUNITY)
Admission: RE | Disposition: A | Discharge status: Home / Self Care | Payer: Medicare Other | Source: Ambulatory Visit | Attending: Cardiology

## 2014-11-08 ENCOUNTER — Encounter (HOSPITAL_COMMUNITY): Payer: Medicare Other

## 2014-11-08 ENCOUNTER — Ambulatory Visit (HOSPITAL_COMMUNITY)
Admission: RE | Admit: 2014-11-08 | Discharge: 2014-11-08 | Disposition: A | Discharge status: Home / Self Care | Payer: Medicare Other | Source: Ambulatory Visit | Attending: Cardiology | Admitting: Cardiology

## 2014-11-08 DIAGNOSIS — J849 Interstitial pulmonary disease, unspecified: Secondary | ICD-10-CM | POA: Insufficient documentation

## 2014-11-08 DIAGNOSIS — I272 Other secondary pulmonary hypertension: Secondary | ICD-10-CM | POA: Diagnosis not present

## 2014-11-08 DIAGNOSIS — Z7982 Long term (current) use of aspirin: Secondary | ICD-10-CM | POA: Insufficient documentation

## 2014-11-08 DIAGNOSIS — I270 Primary pulmonary hypertension: Secondary | ICD-10-CM | POA: Diagnosis not present

## 2014-11-08 HISTORY — PX: CARDIAC CATHETERIZATION: SHX172

## 2014-11-08 LAB — POCT I-STAT 3, VENOUS BLOOD GAS (G3P V)
ACID-BASE EXCESS: 9 mmol/L — AB (ref 0.0–2.0)
Acid-Base Excess: 10 mmol/L — ABNORMAL HIGH (ref 0.0–2.0)
BICARBONATE: 35.5 meq/L — AB (ref 20.0–24.0)
BICARBONATE: 35.9 meq/L — AB (ref 20.0–24.0)
O2 SAT: 60 %
O2 SAT: 64 %
PCO2 VEN: 54.9 mmHg — AB (ref 45.0–50.0)
PCO2 VEN: 55.3 mmHg — AB (ref 45.0–50.0)
PO2 VEN: 34 mmHg (ref 30.0–45.0)
TCO2: 37 mmol/L (ref 0–100)
TCO2: 38 mmol/L (ref 0–100)
pH, Ven: 7.416 — ABNORMAL HIGH (ref 7.250–7.300)
pH, Ven: 7.423 — ABNORMAL HIGH (ref 7.250–7.300)
pO2, Ven: 31 mmHg (ref 30.0–45.0)

## 2014-11-08 SURGERY — RIGHT HEART CATH

## 2014-11-08 MED ORDER — SODIUM CHLORIDE 0.9 % IV SOLN
INTRAVENOUS | Status: DC
  Administered 2014-11-08: 07:00:00 via INTRAVENOUS

## 2014-11-08 MED ORDER — HEPARIN (PORCINE) IN NACL 2-0.9 UNIT/ML-% IJ SOLN
INTRAMUSCULAR | Status: AC
  Filled 2014-11-08: qty 500

## 2014-11-08 MED ORDER — FENTANYL CITRATE (PF) 100 MCG/2ML IJ SOLN
INTRAMUSCULAR | Status: DC | PRN
  Administered 2014-11-08: 25 ug via INTRAVENOUS

## 2014-11-08 MED ORDER — LIDOCAINE HCL (PF) 1 % IJ SOLN
INTRAMUSCULAR | Status: AC
  Filled 2014-11-08: qty 30

## 2014-11-08 MED ORDER — FENTANYL CITRATE (PF) 100 MCG/2ML IJ SOLN
INTRAMUSCULAR | Status: AC
  Filled 2014-11-08: qty 4

## 2014-11-08 MED ORDER — MIDAZOLAM HCL 2 MG/2ML IJ SOLN
INTRAMUSCULAR | Status: AC
  Filled 2014-11-08: qty 4

## 2014-11-08 MED ORDER — SODIUM CHLORIDE 0.9 % IV SOLN: 250.0000 mL | INTRAVENOUS | Status: DC | PRN

## 2014-11-08 MED ORDER — SODIUM CHLORIDE 0.9 % IJ SOLN: 3.0000 mL | Freq: Two times a day (BID) | INTRAMUSCULAR | Status: DC

## 2014-11-08 MED ORDER — SODIUM CHLORIDE 0.9 % IJ SOLN: 3.0000 mL | INTRAMUSCULAR | Status: DC | PRN

## 2014-11-08 MED ORDER — MIDAZOLAM HCL 2 MG/2ML IJ SOLN
INTRAMUSCULAR | Status: DC | PRN
  Administered 2014-11-08: 1 mg via INTRAVENOUS

## 2014-11-08 SURGICAL SUPPLY — 8 items
CATH BALLN WEDGE 5F 110CM (CATHETERS) ×3 IMPLANT
GUIDEWIRE .025 260CM (WIRE) ×2 IMPLANT
KIT HEART LEFT (KITS) ×3 IMPLANT
KIT HEART RIGHT NAMIC (KITS) ×3 IMPLANT
PACK CARDIAC CATHETERIZATION (CUSTOM PROCEDURE TRAY) ×3 IMPLANT
SHEATH FAST CATH BRACH 5F 5CM (SHEATH) ×3 IMPLANT
TRANSDUCER W/STOPCOCK (MISCELLANEOUS) ×4 IMPLANT
TUBING CIL FLEX 10 FLL-RA (TUBING) ×3 IMPLANT

## 2014-11-08 NOTE — Discharge Instructions (Signed)
Angiogram, Care After °Refer to this sheet in the next few weeks. These instructions provide you with information about caring for yourself after your procedure. Your health care provider may also give you more specific instructions. Your treatment has been planned according to current medical practices, but problems sometimes occur. Call your health care provider if you have any problems or questions after your procedure. °WHAT TO EXPECT AFTER THE PROCEDURE °After your procedure, it is typical to have the following: °· Bruising at the catheter insertion site that usually fades within 1-2 weeks. °· Blood collecting in the tissue (hematoma) that may be painful to the touch. It should usually decrease in size and tenderness within 1-2 weeks. °HOME CARE INSTRUCTIONS °· Take medicines only as directed by your health care provider. °· You may shower 24-48 hours after the procedure or as directed by your health care provider. Remove the bandage (dressing) and gently wash the site with plain soap and water. Pat the area dry with a clean towel. Do not rub the site, because this may cause bleeding. °· Do not take baths, swim, or use a hot tub until your health care provider approves. °· Check your insertion site every day for redness, swelling, or drainage. °· Do not apply powder or lotion to the site. °· Do not lift over 10 lb (4.5 kg) for 5 days after your procedure or as directed by your health care provider. °· Ask your health care provider when it is okay to: °¨ Return to work or school. °¨ Resume usual physical activities or sports. °¨ Resume sexual activity. °· Do not drive home if you are discharged the same day as the procedure. Have someone else drive you. °· You may drive 24 hours after the procedure unless otherwise instructed by your health care provider. °· Do not operate machinery or power tools for 24 hours after the procedure or as directed by your health care provider. °· If your procedure was done as an  outpatient procedure, which means that you went home the same day as your procedure, a responsible adult should be with you for the first 24 hours after you arrive home. °· Keep all follow-up visits as directed by your health care provider. This is important. °SEEK MEDICAL CARE IF: °· You have a fever. °· You have chills. °· You have increased bleeding from the catheter insertion site. Hold pressure on the site. °SEEK IMMEDIATE MEDICAL CARE IF: °· You have unusual pain at the catheter insertion site. °· You have redness, warmth, or swelling at the catheter insertion site. °· You have drainage (other than a small amount of blood on the dressing) from the catheter insertion site. °· The catheter insertion site is bleeding, and the bleeding does not stop after 30 minutes of holding steady pressure on the site. °· The area near or just beyond the catheter insertion site becomes pale, cool, tingly, or numb. °  °This information is not intended to replace advice given to you by your health care provider. Make sure you discuss any questions you have with your health care provider. °  °Document Released: 07/17/2004 Document Revised: 01/19/2014 Document Reviewed: 06/01/2012 °Elsevier Interactive Patient Education ©2016 [employer] Inc[employer] ° °

## 2014-11-08 NOTE — H&P (View-Only) (Signed)
Patient ID: Beda D Hinote, female   DOB: 02/18/1940, 74 y.o.   MRN: [id number] PCP: Dr. Fulp Pulmonology: Dr. Ramaswamy Cardiology: Dr. Chesney Klimaszewski  74 yo with history of interstitial lung disease presents for evaluation for pulmonary hypertension.  Patient has had a diagnosis of interstitial lung disease since 2013.  She has marked fibrotic changes on chest CT and restrictive PFTs.  Cause for ILD is not certain.  Skin biopsy was negative for sarcoidosis.  Possible diagnoses include UIP, hypersensitivity pneumonitis, and post-infectious fibrosis.  Serological workup was not particularly revealing (see below).  She had an echo with normal-appearing RV and normal estimation of PA pressure, but CT chest showed a markedly enlarged PA that is concerning for pulmonary hypertension.    Patient was admitted with progressive dyspnea in 10/16.  She was treated with steroids.  Steroids have helped her symptoms.  She is oxygen dependent (4L with exertion, 2 L at rest).  She is not short of breath in the house.  She is not particularly short of breath walking short distances as long as she is using oxygen.  She fatigues easily.  No chest pain, lightheadedness, or syncope.  Rare palpitations.    Labs (10/16): CCP negative, SCL-70 negative, SSA negative, SSB positive, dsDNA negative, ANCA negative, RF negative, HSP negative, BNP 89, K 4.2, creatinine 0.5, ESR 84, ACE negative.   ECG: NSR, right axis deviation, low voltage.   PMH: 1. Interstitial lung disease:  Diagnosed summer 2013.  Initial concern for sarcoid but not confirmed, skin biopsy was negative.  ?Hypersensitivity pneumonitis versus post-inflammatory fibrosis versus UIP versus sarcoidosis.  CT chest 10/16 with bilateral fibrotic changes, marked enlargement of the main PA.  PFTs (12/14) with FVC 33%, FEV1 44%, ratio 130%, DLCO 42%, TLC 45% => severe restrictive lung disease . 2. ?Pulmonary hypertension: Echo (10/16) with EF 60-65%, mild LVH, PA systolic  pressure only 25 mmHg, RV normal.  However, CT chest in 10/16 showed marked enlargement of the main PA.   SH: Married, husband in poor health.  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    FH: Brother and mother with COPD, no significant cardiac history.   ROS: All systems reviewed and negative except as per HPI.   Current Outpatient Prescriptions  Medication Sig Dispense Refill  . aspirin 81 MG tablet Take 81 mg by mouth daily.    . famotidine (PEPCID) 20 MG tablet Take 1 tablet (20 mg total) by mouth at bedtime. 90 tablet 3  . fluticasone (FLONASE) 50 MCG/ACT nasal spray Place 2 sprays into both nostrils daily. 16 g 0  . furosemide (LASIX) 40 MG tablet Take 1 tablet (40 mg total) by mouth daily. 30 tablet 0  . guaiFENesin (MUCINEX) 600 MG 12 hr tablet Take 1,200 mg by mouth as needed for congestion.    . pantoprazole (PROTONIX) 40 MG tablet TAKE 1 TABLET BY MOUTH 30 TO 60 MINUTES BEFORE FIRST MEAL OF THE DAY 90 tablet 1  . potassium chloride (K-DUR) 10 MEQ tablet Take 1 tablet (10 mEq total) by mouth daily. 30 tablet 0  . predniSONE (DELTASONE) 10 MG tablet Take 40mg (4 tabs) x 4 days, then 30mg (3 tabs) x 4 days, then 20mg (2 tabs) x 4 days, then 10mg (1 tab) 4 days. 40 tablet 0  . Saline (SIMPLY SALINE) 0.9 % AERS Place 1 spray into the nose daily as needed (allergies).      No current facility-administered medications for this encounter.   BP 108/58 mmHg    Pulse 75  Ht 5' 1.5" (1.562 m)  Wt 219 lb 12.8 oz (99.701 kg)  BMI 40.86 kg/m2  SpO2 95% General: NAD Neck: JVP 8-9 cm, no thyromegaly or thyroid nodule.  Lungs: Dry crackles at bases bilaterally.   CV: Nondisplaced PMI.  Heart regular S1/S2 with loud P2, no S3/S4, no murmur.  1+ ankle edema.  No carotid bruit.  Normal pedal pulses.  Abdomen: Soft, nontender, no hepatosplenomegaly, no distention.  Skin: Intact without lesions or rashes.  Neurologic: Alert and oriented x 3.  Psych: Normal affect. Extremities:  +Clubbing, no cyanosis.   HEENT: Normal.   Assessment/Plan: 1. Interstitial lung disease: Marked fibrotic changes on CT chest and restrictive PFTs.  Cause is not yet determined, may end up being UIP.  She is on home oxygen.  She has been steroid-responsive.  2. ?Pulmonary hypertension: Patient may have pulmonary hypertension.  Her echo appeared to show a normal RV and estimation of PA pressure was not elevated, but she had a CT chest recently with a markedly dilated PA.  She has a loud P2 on exam and she has mildly elevated JVP.  I think that we need to determine whether or not she has significant pulmonary arterial hypertension. - I will arrange for RHC.  We discussed risks/benefits of the procedure and she agrees to proceed.  - I will continue the same dose of Lasix for now.  However, I suspect that I may increase it based on the RHC findings (JVD on exam).    Loralie Champagne 10/30/2014

## 2014-11-08 NOTE — Interval H&P Note (Signed)
History and Physical Interval Note:  11/08/2014 7:57 AM  Miranda Hines  has presented today for surgery, with the diagnosis of hp  The various methods of treatment have been discussed with the patient and family. After consideration of risks, benefits and other options for treatment, the patient has consented to  Procedure(s): Right Heart Cath (N/A) as a surgical intervention .  The patient's history has been reviewed, patient examined, no change in status, stable for surgery.  I have reviewed the patient's chart and labs.  Questions were answered to the patient's satisfaction.     Andres Vest [facility] [employer]

## 2014-11-09 ENCOUNTER — Telehealth: Payer: Self-pay | Admitting: Internal Medicine

## 2014-11-09 MED FILL — Lidocaine HCl Local Preservative Free (PF) Inj 1%: INTRAMUSCULAR | Qty: 30 | Status: AC

## 2014-11-09 MED FILL — Heparin Sodium (Porcine) 2 Unit/ML in Sodium Chloride 0.9%: INTRAMUSCULAR | Qty: 1500 | Status: AC

## 2014-11-09 NOTE — Telephone Encounter (Signed)
Miranda Hines 9cc To Dr Kendrick FriesMcQuaid  Pls give appt tos ee dr Kendrick FriesMcQuaid for new dx of pulm htn. He might need a 30 min slot  Thanks  Dr. Kalman ShanMurali Palmer Shorey, M.D., [facility].C.C.P Pulmonary and Critical Care Medicine Staff Physician [facility] Pager: [phone], If no answer or between  15:00h - 7:00h: call [phone]  11/09/2014 11:19 AM

## 2014-11-12 NOTE — Telephone Encounter (Signed)
Great, thanks

## 2014-11-12 NOTE — Telephone Encounter (Signed)
Pt has been scheduled to see BQ tomorrow at 3:45pm. Will route message to BQ to make him aware.

## 2014-11-12 NOTE — Telephone Encounter (Signed)
Is this because I don't have any 30 minute slots? Do I have 15 minute slots? If not then I can try to overbook her in this week sometime, preferably on a day when I am not already overbooked

## 2014-11-12 NOTE — Telephone Encounter (Signed)
Next available is in January for Dr. Kendrick FriesMcQuaid. Is this okay?  Pt has pending appt with MR on 11/15. Please advise thanks

## 2014-11-12 NOTE — Telephone Encounter (Signed)
Pls see if he can see her earlier. Let me know. I have copied him

## 2014-11-12 NOTE — Telephone Encounter (Signed)
OK, would be happy to see her

## 2014-11-13 ENCOUNTER — Encounter (HOSPITAL_COMMUNITY): Payer: Self-pay

## 2014-11-13 ENCOUNTER — Ambulatory Visit (INDEPENDENT_AMBULATORY_CARE_PROVIDER_SITE_OTHER): Payer: Medicare Other | Admitting: Pulmonary Disease

## 2014-11-13 ENCOUNTER — Inpatient Hospital Stay (HOSPITAL_COMMUNITY)
Admission: AD | Admit: 2014-11-13 | Discharge: 2014-11-21 | DRG: 196 | Disposition: A | Discharge status: Home / Self Care | Payer: Medicare Other | Source: Ambulatory Visit | Attending: Pulmonary Disease | Admitting: Pulmonary Disease

## 2014-11-13 ENCOUNTER — Encounter (HOSPITAL_COMMUNITY): Payer: Medicare Other

## 2014-11-13 ENCOUNTER — Inpatient Hospital Stay (HOSPITAL_COMMUNITY): Payer: Medicare Other

## 2014-11-13 ENCOUNTER — Encounter: Payer: Self-pay | Admitting: Pulmonary Disease

## 2014-11-13 ENCOUNTER — Telehealth (HOSPITAL_COMMUNITY): Payer: Self-pay | Admitting: *Deleted

## 2014-11-13 VITALS — BP 130/72 | HR 107 | Temp 98.4°F

## 2014-11-13 DIAGNOSIS — D649 Anemia, unspecified: Secondary | ICD-10-CM | POA: Diagnosis present

## 2014-11-13 DIAGNOSIS — Z7982 Long term (current) use of aspirin: Secondary | ICD-10-CM | POA: Diagnosis not present

## 2014-11-13 DIAGNOSIS — J8410 Pulmonary fibrosis, unspecified: Secondary | ICD-10-CM

## 2014-11-13 DIAGNOSIS — R21 Rash and other nonspecific skin eruption: Secondary | ICD-10-CM

## 2014-11-13 DIAGNOSIS — E876 Hypokalemia: Secondary | ICD-10-CM | POA: Diagnosis present

## 2014-11-13 DIAGNOSIS — R609 Edema, unspecified: Secondary | ICD-10-CM | POA: Diagnosis not present

## 2014-11-13 DIAGNOSIS — E871 Hypo-osmolality and hyponatremia: Secondary | ICD-10-CM | POA: Diagnosis present

## 2014-11-13 DIAGNOSIS — J209 Acute bronchitis, unspecified: Secondary | ICD-10-CM | POA: Diagnosis present

## 2014-11-13 DIAGNOSIS — I272 Other secondary pulmonary hypertension: Secondary | ICD-10-CM | POA: Diagnosis present

## 2014-11-13 DIAGNOSIS — Z515 Encounter for palliative care: Secondary | ICD-10-CM | POA: Diagnosis not present

## 2014-11-13 DIAGNOSIS — Z6841 Body Mass Index (BMI) 40.0 and over, adult: Secondary | ICD-10-CM

## 2014-11-13 DIAGNOSIS — R7989 Other specified abnormal findings of blood chemistry: Secondary | ICD-10-CM

## 2014-11-13 DIAGNOSIS — Z880 Allergy status to penicillin: Secondary | ICD-10-CM | POA: Diagnosis not present

## 2014-11-13 DIAGNOSIS — J84112 Idiopathic pulmonary fibrosis: Secondary | ICD-10-CM | POA: Diagnosis present

## 2014-11-13 DIAGNOSIS — J189 Pneumonia, unspecified organism: Secondary | ICD-10-CM

## 2014-11-13 DIAGNOSIS — Z7722 Contact with and (suspected) exposure to environmental tobacco smoke (acute) (chronic): Secondary | ICD-10-CM | POA: Diagnosis present

## 2014-11-13 DIAGNOSIS — J9601 Acute respiratory failure with hypoxia: Secondary | ICD-10-CM | POA: Diagnosis present

## 2014-11-13 DIAGNOSIS — R0989 Other specified symptoms and signs involving the circulatory and respiratory systems: Secondary | ICD-10-CM

## 2014-11-13 DIAGNOSIS — R0600 Dyspnea, unspecified: Secondary | ICD-10-CM

## 2014-11-13 DIAGNOSIS — J849 Interstitial pulmonary disease, unspecified: Secondary | ICD-10-CM | POA: Insufficient documentation

## 2014-11-13 DIAGNOSIS — I10 Essential (primary) hypertension: Secondary | ICD-10-CM | POA: Diagnosis present

## 2014-11-13 DIAGNOSIS — R002 Palpitations: Secondary | ICD-10-CM

## 2014-11-13 DIAGNOSIS — J9621 Acute and chronic respiratory failure with hypoxia: Secondary | ICD-10-CM | POA: Diagnosis not present

## 2014-11-13 DIAGNOSIS — M549 Dorsalgia, unspecified: Secondary | ICD-10-CM | POA: Diagnosis present

## 2014-11-13 DIAGNOSIS — J9600 Acute respiratory failure, unspecified whether with hypoxia or hypercapnia: Secondary | ICD-10-CM | POA: Insufficient documentation

## 2014-11-13 DIAGNOSIS — R0902 Hypoxemia: Secondary | ICD-10-CM

## 2014-11-13 DIAGNOSIS — D6489 Other specified anemias: Secondary | ICD-10-CM | POA: Diagnosis not present

## 2014-11-13 DIAGNOSIS — Z79899 Other long term (current) drug therapy: Secondary | ICD-10-CM | POA: Diagnosis not present

## 2014-11-13 DIAGNOSIS — J9690 Respiratory failure, unspecified, unspecified whether with hypoxia or hypercapnia: Secondary | ICD-10-CM

## 2014-11-13 DIAGNOSIS — J9612 Chronic respiratory failure with hypercapnia: Secondary | ICD-10-CM

## 2014-11-13 LAB — COMPREHENSIVE METABOLIC PANEL
ALK PHOS: 71 U/L (ref 38–126)
ALT: 18 U/L (ref 14–54)
ANION GAP: 11 (ref 5–15)
AST: 28 U/L (ref 15–41)
Albumin: 2.9 g/dL — ABNORMAL LOW (ref 3.5–5.0)
BILIRUBIN TOTAL: 1.2 mg/dL (ref 0.3–1.2)
BUN: 14 mg/dL (ref 6–20)
CALCIUM: 8.5 mg/dL — AB (ref 8.9–10.3)
CO2: 31 mmol/L (ref 22–32)
Chloride: 91 mmol/L — ABNORMAL LOW (ref 101–111)
Creatinine, Ser: 0.59 mg/dL (ref 0.44–1.00)
GFR calc non Af Amer: >60 mL/min (ref >60)
Glucose, Bld: 138 mg/dL — ABNORMAL HIGH (ref 65–99)
Potassium: 3.6 mmol/L (ref 3.5–5.1)
Sodium: 133 mmol/L — ABNORMAL LOW (ref 135–145)
TOTAL PROTEIN: 6.2 g/dL — AB (ref 6.5–8.1)

## 2014-11-13 LAB — PROCALCITONIN: Procalcitonin: <0.1 ng/mL

## 2014-11-13 LAB — CBC WITH DIFFERENTIAL/PLATELET
BASOS ABS: 0 10*3/uL (ref 0.0–0.1)
Basophils Relative: 0 %
Eosinophils Absolute: 0.4 10*3/uL (ref 0.0–0.7)
Eosinophils Relative: 5 %
HEMATOCRIT: 27.9 % — AB (ref 36.0–46.0)
Hemoglobin: 9 g/dL — ABNORMAL LOW (ref 12.0–15.0)
LYMPHS ABS: 1.5 10*3/uL (ref 0.7–4.0)
LYMPHS PCT: 18 %
MCH: 32.3 pg (ref 26.0–34.0)
MCHC: 32.3 g/dL (ref 30.0–36.0)
MCV: 100 fL (ref 78.0–100.0)
MONO ABS: 0.5 10*3/uL (ref 0.1–1.0)
Monocytes Relative: 6 %
NEUTROS ABS: 5.9 10*3/uL (ref 1.7–7.7)
Neutrophils Relative %: 71 %
Platelets: 239 10*3/uL (ref 150–400)
RBC: 2.79 MIL/uL — AB (ref 3.87–5.11)
RDW: 14.7 % (ref 11.5–15.5)
WBC: 8.3 10*3/uL (ref 4.0–10.5)

## 2014-11-13 LAB — MAGNESIUM: Magnesium: 1.7 mg/dL (ref 1.7–2.4)

## 2014-11-13 LAB — PHOSPHORUS: PHOSPHORUS: 3.2 mg/dL (ref 2.5–4.6)

## 2014-11-13 LAB — BRAIN NATRIURETIC PEPTIDE: B Natriuretic Peptide: 105.4 pg/mL — ABNORMAL HIGH (ref 0.0–100.0)

## 2014-11-13 LAB — TROPONIN I

## 2014-11-13 MED ORDER — DEXTROSE 5 % IV SOLN
2.0000 g | Freq: Three times a day (TID) | INTRAVENOUS | Status: DC
  Administered 2014-11-13 – 2014-11-16 (×9): 2 g via INTRAVENOUS
  Filled 2014-11-13 (×10): qty 2

## 2014-11-13 MED ORDER — SALINE 0.9 % NA AERS: 1.0000 | INHALATION_SPRAY | Freq: Every day | NASAL | Status: DC | PRN

## 2014-11-13 MED ORDER — SILDENAFIL CITRATE 20 MG PO TABS
20.0000 mg | ORAL_TABLET | Freq: Three times a day (TID) | ORAL | Status: DC
  Administered 2014-11-13 – 2014-11-21 (×23): 20 mg via ORAL
  Filled 2014-11-13 (×28): qty 1

## 2014-11-13 MED ORDER — FUROSEMIDE 40 MG PO TABS
40.0000 mg | ORAL_TABLET | Freq: Every day | ORAL | Status: DC
  Administered 2014-11-14 – 2014-11-21 (×8): 40 mg via ORAL
  Filled 2014-11-13 (×8): qty 1

## 2014-11-13 MED ORDER — SODIUM CHLORIDE 0.9 % IV SOLN: 250.0000 mL | INTRAVENOUS | Status: DC | PRN

## 2014-11-13 MED ORDER — FUROSEMIDE 40 MG PO TABS: 40.0000 mg | ORAL_TABLET | Freq: Every day | ORAL | Status: DC

## 2014-11-13 MED ORDER — GUAIFENESIN ER 600 MG PO TB12
1200.0000 mg | ORAL_TABLET | Freq: Two times a day (BID) | ORAL | Status: DC | PRN
Start: 2014-11-13 — End: 2014-11-21
  Administered 2014-11-13 – 2014-11-20 (×5): 1200 mg via ORAL
  Filled 2014-11-13 (×6): qty 2

## 2014-11-13 MED ORDER — ASPIRIN 81 MG PO CHEW
81.0000 mg | CHEWABLE_TABLET | Freq: Every day | ORAL | Status: DC
  Administered 2014-11-13 – 2014-11-14 (×2): 81 mg via ORAL
  Filled 2014-11-13 (×3): qty 1

## 2014-11-13 MED ORDER — HEPARIN SODIUM (PORCINE) 5000 UNIT/ML IJ SOLN
5000.0000 [IU] | Freq: Three times a day (TID) | INTRAMUSCULAR | Status: DC
  Administered 2014-11-13 – 2014-11-21 (×23): 5000 [IU] via SUBCUTANEOUS
  Filled 2014-11-13 (×23): qty 1

## 2014-11-13 MED ORDER — ONDANSETRON HCL 4 MG/2ML IJ SOLN: 4.0000 mg | Freq: Four times a day (QID) | INTRAMUSCULAR | Status: DC | PRN

## 2014-11-13 MED ORDER — VANCOMYCIN HCL IN DEXTROSE 750-5 MG/150ML-% IV SOLN
750.0000 mg | Freq: Two times a day (BID) | INTRAVENOUS | Status: DC
  Administered 2014-11-13 – 2014-11-15 (×4): 750 mg via INTRAVENOUS
  Filled 2014-11-13 (×5): qty 150

## 2014-11-13 MED ORDER — POTASSIUM CHLORIDE ER 10 MEQ PO TBCR
10.0000 meq | EXTENDED_RELEASE_TABLET | Freq: Every day | ORAL | Status: DC
  Administered 2014-11-14 – 2014-11-21 (×8): 10 meq via ORAL
  Filled 2014-11-13 (×16): qty 1

## 2014-11-13 MED ORDER — SILDENAFIL CITRATE 20 MG PO TABS: 20.0000 mg | ORAL_TABLET | Freq: Three times a day (TID) | ORAL | Status: AC

## 2014-11-13 MED ORDER — PANTOPRAZOLE SODIUM 40 MG PO TBEC
40.0000 mg | DELAYED_RELEASE_TABLET | Freq: Every day | ORAL | Status: DC
  Administered 2014-11-13 – 2014-11-21 (×9): 40 mg via ORAL
  Filled 2014-11-13 (×9): qty 1

## 2014-11-13 MED ORDER — ACETAMINOPHEN 325 MG PO TABS
650.0000 mg | ORAL_TABLET | ORAL | Status: DC | PRN
  Administered 2014-11-14 – 2014-11-16 (×6): 650 mg via ORAL
  Filled 2014-11-13 (×6): qty 2

## 2014-11-13 MED ORDER — FLUTICASONE PROPIONATE 50 MCG/ACT NA SUSP
2.0000 | Freq: Every day | NASAL | Status: DC
  Administered 2014-11-14 – 2014-11-21 (×8): 2 via NASAL
  Filled 2014-11-13 (×2): qty 16

## 2014-11-13 MED ORDER — FUROSEMIDE 10 MG/ML IJ SOLN
40.0000 mg | Freq: Four times a day (QID) | INTRAMUSCULAR | Status: AC
  Administered 2014-11-13 – 2014-11-14 (×2): 40 mg via INTRAVENOUS
  Filled 2014-11-13 (×2): qty 4

## 2014-11-13 MED ORDER — FAMOTIDINE 20 MG PO TABS
20.0000 mg | ORAL_TABLET | Freq: Every day | ORAL | Status: DC
  Administered 2014-11-13 – 2014-11-20 (×8): 20 mg via ORAL
  Filled 2014-11-13 (×8): qty 1

## 2014-11-13 NOTE — Progress Notes (Signed)
ANTIBIOTIC CONSULT NOTE - INITIAL  Pharmacy Consult for Vancomycin and Aztreonam Indication: rule out pneumonia  Allergies  Allergen Reactions  . Penicillins Swelling    Patient Measurements: Height: 5\' 1"  (154.9 cm) Weight: 217 lb 6.4 oz (98.612 kg) IBW/kg (Calculated) : 47.8 Adjusted Body Weight:   Vital Signs: Temp: 98.7 F (37.1 C) (11/01 1721) Temp Source: Oral (11/01 1721) BP: 133/60 mmHg (11/01 1721) Pulse Rate: 91 (11/01 1721) Intake/Output from previous day:   Intake/Output from this shift:    Labs:  Recent Labs  11/13/14 1919  WBC 8.3  HGB 9.0*  PLT 239   Estimated Creatinine Clearance: 68.3 mL/min (by C-G formula based on Cr of 0.62). No results for input(s): VANCOTROUGH, VANCOPEAK, VANCORANDOM, GENTTROUGH, GENTPEAK, GENTRANDOM, TOBRATROUGH, TOBRAPEAK, TOBRARND, AMIKACINPEAK, AMIKACINTROU, AMIKACIN in the last 72 hours.   Microbiology: No results found for this or any previous visit (from the past 720 hour(s)).  Medical History: Past Medical History  Diagnosis Date  . Interstitial lung disease (HCC)   . IPF (idiopathic pulmonary fibrosis) (HCC)     Medications:  Prescriptions prior to admission  Medication Sig Dispense Refill Last Dose  . aspirin 81 MG tablet Take 81 mg by mouth daily.   11/12/2014 at Unknown time  . famotidine (PEPCID) 20 MG tablet Take 1 tablet (20 mg total) by mouth at bedtime. 90 tablet 3 11/12/2014 at Unknown time  . fluticasone (FLONASE) 50 MCG/ACT nasal spray Place 2 sprays into both nostrils daily. 16 g 0 11/13/2014 at Unknown time  . furosemide (LASIX) 40 MG tablet Take 1 tablet (40 mg total) by mouth daily. 30 tablet 0 11/12/2014 at Unknown time  . guaiFENesin (MUCINEX) 600 MG 12 hr tablet Take 1,200 mg by mouth as needed for congestion.   11/12/2014 at Unknown time  . pantoprazole (PROTONIX) 40 MG tablet TAKE 1 TABLET BY MOUTH 30 TO 60 MINUTES BEFORE FIRST MEAL OF THE DAY 90 tablet 1 11/12/2014 at Unknown time  .  potassium chloride (K-DUR) 10 MEQ tablet Take 1 tablet (10 mEq total) by mouth daily. 30 tablet 0 11/12/2014 at Unknown time  . Saline (SIMPLY SALINE) 0.9 % AERS Place 1 spray into the nose daily as needed (allergies).    11/12/2014 at Unknown time  . sildenafil (REVATIO) 20 MG tablet Take 1 tablet (20 mg total) by mouth 3 (three) times daily. 90 tablet 5 11/12/2014 at Unknown time   Scheduled:  . aspirin  81 mg Oral Daily  . famotidine  20 mg Oral QHS  . fluticasone  2 spray Each Nare Daily  . furosemide  40 mg Intravenous Q6H  . furosemide  40 mg Oral Daily  . heparin  5,000 Units Subcutaneous 3 times per day  . pantoprazole  40 mg Oral Daily  . potassium chloride  10 mEq Oral Daily  . sildenafil  20 mg Oral TID   Infusions:    Assessment: 74yo female with history of interstitial lung disease and PAH presents with SOB. Pharmacy is consulted to dose vancomycin and aztreonam for suspected pneumonia. Pt is afebrile, WBC wnl, sCr 0.59.  Goal of Therapy:  Vancomycin trough level 15-20 mcg/ml  Plan:  Vancomycin 750mg  IV q12h Aztreonam 2g IV q8h Expected duration 7 days with resolution of temperature and/or normalization of WBC Measure antibiotic drug levels at steady state Follow up culture results, renal function and clinical course  Arlean HoppingCorey M. Newman PiesBall, PharmD Clinical Pharmacist Pager [phone] 11/13/2014,5:54 PM

## 2014-11-13 NOTE — Patient Instructions (Addendum)
I am going to start the application process for Revatio for your pulmonary hypertension  We are going to have you admitted to the hospital for treatment of pneumonia and we will give you diuretic therapy.  We will see you back in clinic in 2-3 weeks

## 2014-11-13 NOTE — Progress Notes (Signed)
Subjective:    Patient ID: Miranda Hines, female    DOB: 1940-11-04, 74 y.o.   MRN: [id number]  HPI Chief Complaint  Patient presents with  . Advice Only    referred for pulm htn by MR.  pt c/o chest pain, fatigue, prod cough with clear mucus.  had R heart cath on thursday and has been feeling worse since.      Miranda Hines was referred to me today for evaluation of pulmonary hypertension. She has a history of interstitial lung disease which is poorly understood. She has been followed by our partners here in the office and apparently has had some response to steroids in the past. 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 Apparently at one point she did work in her room which previously contained cobalt but she never worked directly with it or other chemicals. However, she's had shortness of breath off and on for several years but she's been aware of her diagnosis of pulmonary fibrosis for 2 years. She says that in the last 6 months she's been experiencing increasing shortness of breath and symptoms. She has also noted that her oxygen level has been dropping more frequently. Our office staff notes that frequently when she would come for visit several months ago that her O2 saturation would commonly be in the 70s range on room air when ambulating.  However, she's had progression of shortness of breath associated with increasing leg swelling in the last several weeks. She was hospitalized earlier this month and was treated with IV Lasix which improved her symptoms. She did up having a right heart catheterization on 11/08/2014 which showed a pulmonary arterial pressure of 45/19 (mean 32), pulmonary capillary wedge pressure of 8. Pulmonary arterial oxygen saturation was 62%, arterial O2 saturation 100%. Cardiac output was 3.69 by Fick, cardiac index 2.15. Pulmonary vascular resistance was 6.5 Woods units.  For  the last 3-4 days she's had progressive cough, weakness, mucus production, and chills. She says this is associated with a mild right-sided chest pain. She's been significantly more short of breath during this time. She's also noted increasing leg swelling.  Past Medical History  Diagnosis Date  . Interstitial lung disease (HCC)   . IPF (idiopathic pulmonary fibrosis) (HCC)      Family History  Problem Relation Age of Onset  . Kidney failure Mother   . COPD Mother   . COPD Brother      Social History   Social History  . Marital Status: Married    Spouse Name: N/A  . Number of Children: N/A  . Years of Education: N/A   Occupational History  . Not on file.   Social History Main Topics  . Smoking status: Passive Smoke Exposure - Never Smoker  . Smokeless tobacco: Not on file  . Alcohol Use: No  . Drug Use: No  . Sexual Activity: Not on file   Other Topics Concern  . Not on file   Social History Narrative     Allergies  Allergen Reactions  . Penicillins Swelling     Outpatient Prescriptions Prior to Visit  Medication Sig Dispense Refill  . aspirin 81 MG tablet Take 81 mg by mouth daily.    . famotidine (PEPCID) 20 MG tablet Take 1 tablet (20 mg total) by mouth at bedtime. 90 tablet 3  . fluticasone (FLONASE) 50 MCG/ACT nasal spray Place 2 sprays into both nostrils daily. 16  g 0  . furosemide (LASIX) 40 MG tablet Take 1 tablet (40 mg total) by mouth daily. 30 tablet 0  . guaiFENesin (MUCINEX) 600 MG 12 hr tablet Take 1,200 mg by mouth as needed for congestion.    . pantoprazole (PROTONIX) 40 MG tablet TAKE 1 TABLET BY MOUTH 30 TO 60 MINUTES BEFORE FIRST MEAL OF THE DAY 90 tablet 1  . potassium chloride (K-DUR) 10 MEQ tablet Take 1 tablet (10 mEq total) by mouth daily. 30 tablet 0  . Saline (SIMPLY SALINE) 0.9 % AERS Place 1 spray into the nose daily as needed (allergies).     . predniSONE (DELTASONE) 10 MG tablet Take  (4 tabs) x 4 days, then  (3 tabs) x 4  days, then  (2 tabs) x 4 days, then  (1 tab) 4 days. (Patient not taking: Reported on 11/13/2014) 40 tablet 0   No facility-administered medications prior to visit.       Review of Systems  Constitutional: Positive for chills and fatigue. Negative for fever.  HENT: Negative for postnasal drip, rhinorrhea and sinus pressure.   Respiratory: Positive for cough and shortness of breath. Negative for wheezing.   Cardiovascular: Positive for chest pain and leg swelling. Negative for palpitations.       Objective:   Physical Exam Filed Vitals:   11/13/14 1552  BP: 130/72  Pulse: 107  Temp: 98.4 F (36.9 C)  TempSrc: Oral  SpO2: 95%   3L O2  Gen: chronically ill appearing, mild respiratory distress HENT: OP clear,  neck supple PULM: crackles 1/2 way up bilaterally, increased effort CV: Tachy, regular, no mgr, notable ankle edema GI: BS+, soft, nontender Derm: no cyanosis or rash Psyche: normal mood and affect  Right heart catheterization results reviewed, see summary above Previous pulmonary records reviewed CT chest images personally reviewed showing scattered groundglass with a nonspecific intralobular septal thickening pattern not consistent with usual interstitial pneumonitis      Assessment & Plan:  #1 acute hypoxemic respiratory failure: I am concerned that she may be experiencing healthcare associated pneumonia considering her cough, chills, mucus production, increased shortness of breath, and fatigue. The differential diagnosis includes pulmonary edema though her most recent right heart cath showed a normal pulmonary capillary wedge pressure. Less likely would be a flare of her underlying interstitial lung disease. 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 Based on that, there is no clear data from the literature that pulmonary vasodilator therapy will be helpful. However, considering the profound right heart failure symptoms she has on exam as well as the findings from her right heart catheterization it may be worth a trial of vasodilator therapy to see if it helps her symptoms have been progressing significantly over the last several months. I do not feel using endothelin receptor antagonist as the best idea considering her significant lower extremity swelling. Plan: Revatio 20 mg 3 times a day, will start prior authorization process  Case was discussed with the patient's daughter who is here today.  Miranda Waverly, MD [facility] Pager: [phone] Cell: [phone] After 3pm or if no response, call [phone]

## 2014-11-13 NOTE — H&P (Signed)
PULMONARY / CRITICAL CARE MEDICINE   Name: Miranda Hines MRN: [id number] DOB: 1940/05/12    ADMISSION DATE:  11/13/2014 CONSULTATION DATE:  11/13/2014  REFERRING MD :  Kendrick FriesMcQuaid  CHIEF COMPLAINT:  Shortness of breath  INITIAL PRESENTATION: 74 year old female with interstitial lung disease of uncertain etiology and recently diagnosed pulmonary hypertension was admitted from the pulmonary office on 11/13/2014 for acute hypoxemic respiratory failure felt to be either due to healthcare associated pneumonia or pulmonary edema.  STUDIES:  Chest x-ray November 1>>  SIGNIFICANT EVENTS:    HISTORY OF PRESENT ILLNESS:    [name] was referred to me today for evaluation of pulmonary hypertension. She has a history of interstitial lung disease which is poorly understood. She has been followed by our partners here in the office and apparently has had some response to steroids in the past. 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 Apparently at one point she did work in her room which previously contained cobalt but she never worked directly with it or other chemicals. However, she's had shortness of breath off and on for several years but she's been aware of her diagnosis of pulmonary fibrosis for 2 years. She says that in the last 6 months she's been experiencing increasing shortness of breath and symptoms. She has also noted that her oxygen level has been dropping more frequently. Our office staff notes that frequently when she would come for visit several months ago that her O2 saturation would commonly be in the 70s range on room air when ambulating.  However, she's had progression of shortness of breath associated with increasing leg swelling in the last several weeks. She was hospitalized earlier this month and was treated with IV Lasix which improved her symptoms. She did up having a right heart  catheterization on 11/08/2014 which showed a pulmonary arterial pressure of 45/19 (mean 32), pulmonary capillary wedge pressure of 8. Pulmonary arterial oxygen saturation was 62%, arterial O2 saturation 100%. Cardiac output was 3.69 by Fick, cardiac index 2.15. Pulmonary vascular resistance was 6.5 Woods units.  For the last 3-4 days she's had progressive cough, weakness, mucus production, and chills. She says this is associated with a mild right-sided chest pain. She's been significantly more short of breath during this time. She's also noted increasing leg swelling.  PAST MEDICAL HISTORY :   has a past medical history of Interstitial lung disease (HCC) and IPF (idiopathic pulmonary fibrosis) (HCC).  has past surgical history that includes Appendectomy; Tubal ligation; and Cardiac catheterization (N/A, 11/08/2014). Prior to Admission medications   Medication Sig Start Date End Date Taking? Authorizing Provider  aspirin 81 MG tablet Take 81 mg by mouth daily.    Historical Provider, MD  famotidine (PEPCID) 20 MG tablet Take 1 tablet (20 mg total) by mouth at bedtime. 06/04/14   Nyoka CowdenMichael B Wert, MD  fluticasone (FLONASE) 50 MCG/ACT nasal spray Place 2 sprays into both nostrils daily. 10/23/14   Maryann Mikhail, DO  furosemide (LASIX) 40 MG tablet Take 1 tablet (40 mg total) by mouth daily. 10/23/14   Maryann Mikhail, DO  guaiFENesin (MUCINEX) 600 MG 12 hr tablet Take 1,200 mg by mouth as needed for congestion.    Historical Provider, MD  pantoprazole (PROTONIX) 40 MG tablet TAKE 1 TABLET BY MOUTH 30 TO 60 MINUTES BEFORE FIRST MEAL OF THE DAY 10/10/13   Nyoka CowdenMichael B Wert, MD  potassium chloride (K-DUR) 10 MEQ tablet Take 1  tablet (10 mEq total) by mouth daily. 10/23/14   Maryann Mikhail, DO  Saline (SIMPLY SALINE) 0.9 % AERS Place 1 spray into the nose daily as needed (allergies).     Historical Provider, MD  sildenafil (REVATIO) 20 MG tablet Take 1 tablet (20 mg total) by mouth 3 (three) times daily.  11/13/14   Lupita Leash, MD   Allergies  Allergen Reactions  . Penicillins Swelling    FAMILY HISTORY:  indicated that her mother is deceased. She indicated that her father is deceased. She indicated that all of her three sisters are alive. She indicated that both of her brothers are alive.  SOCIAL HISTORY:  reports that she has been passively smoking.  She does not have any smokeless tobacco history on file. She reports that she does not drink alcohol or use illicit drugs.  REVIEW OF SYSTEMS:   Gen: Denies  fever, + chills, weight change, fatigue, night sweats HEENT: Denies blurred vision, double vision, hearing loss, tinnitus, sinus congestion, rhinorrhea, sore throat, neck stiffness, dysphagia PULM: per HPI CV: Denies chest pain, + edema, orthopnea, paroxysmal nocturnal dyspnea, palpitations GI: Denies abdominal pain, nausea, vomiting, diarrhea, hematochezia, melena, constipation, change in bowel habits GU: Denies dysuria, hematuria, polyuria, oliguria, urethral discharge Endocrine: Denies hot or cold intolerance, polyuria, polyphagia or appetite change Derm: Denies rash, dry skin, scaling or peeling skin change Heme: Denies easy bruising, bleeding, bleeding gums Neuro: Denies headache, numbness, weakness, slurred speech, loss of memory or consciousness   SUBJECTIVE:   VITAL SIGNS: Temp:  [98.4 F (36.9 C)-98.7 F (37.1 C)] 98.7 F (37.1 C) (11/01 1721) Pulse Rate:  [91-107] 91 (11/01 1721) Resp:  [22] 22 (11/01 1721) BP: (130-133)/(60-72) 133/60 mmHg (11/01 1721) SpO2:  [95 %-98 %] 98 % (11/01 1721) Weight:  [217 lb 6.4 oz (98.612 kg)] 217 lb 6.4 oz (98.612 kg) (11/01 1721) HEMODYNAMICS:   VENTILATOR SETTINGS:   INTAKE / OUTPUT: No intake or output data in the 24 hours ending 11/13/14 1737  PHYSICAL EXAMINATION: Gen: chronically ill appearing, mild respiratory distress HENT: OP clear, neck supple PULM: crackles 1/2 way up bilaterally, increased effort CV:  Tachy, regular, no mgr, notable ankle edema GI: BS+, soft, nontender Derm: no cyanosis or rash Psyche: normal mood and affect  LABS:  CBC No results for input(s): WBC, HGB, HCT, PLT in the last 168 hours. Coag's No results for input(s): APTT, INR in the last 168 hours. BMET No results for input(s): NA, K, CL, CO2, BUN, CREATININE, GLUCOSE in the last 168 hours. Electrolytes No results for input(s): CALCIUM, MG, PHOS in the last 168 hours. Sepsis Markers No results for input(s): LATICACIDVEN, PROCALCITON, O2SATVEN in the last 168 hours. ABG No results for input(s): PHART, PCO2ART, PO2ART in the last 168 hours. Liver Enzymes No results for input(s): AST, ALT, ALKPHOS, BILITOT, ALBUMIN in the last 168 hours. Cardiac Enzymes No results for input(s): TROPONINI, PROBNP in the last 168 hours. Glucose No results for input(s): GLUCAP in the last 168 hours.  Imaging No results found.   ASSESSMENT / PLAN:  PULMONARY  A: Acute hypoxemic respiratory failure: I am concerned that she may be experiencing healthcare associated pneumonia considering her cough, chills, mucus production, increased shortness of breath, and fatigue. The differential diagnosis includes pulmonary edema though her most recent right heart cath showed a normal pulmonary capillary wedge pressure. Less likely would be a flare of her underlying interstitial lung disease. 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 Based on that, there is no clear data from the literature that pulmonary vasodilator therapy will be  helpful. However, considering the profound right heart failure symptoms she has on exam as well as the findings from her right heart catheterization it may be worth a trial of vasodilator therapy to see if it helps her symptoms have been progressing significantly over the last several months. I do not feel using endothelin receptor antagonist as the best idea considering her significant lower extremity swelling. P:  Revatio 20 mg 3 times a day, will start prior authorization process  RENAL A:  No acute issues P:   Monitor BMET and UOP Replace electrolytes as needed   GASTROINTESTINAL A:  No acute issues P:   Regular diet  HEMATOLOGIC A:  No acute issues P:  Subcutaneous heparin for DVT prophylaxis  INFECTIOUS A:  Healthcare associated pneumonia P:   BCx2 11/13/2014  Vancomycin 11/13/2014 Ceftaz 11/13/2014  ENDOCRINE A:  No acute issues  P:   Monitor glucose  NEUROLOGIC A:  No acute issues P:     FAMILY  - Updates: daughter in room in pulmonary clinic    Heber [place], MD [place] [facility] Pager: [phone] Cell: [phone] After 3pm or if no response, call [phone]  11/13/2014@5 :44 PM

## 2014-11-14 DIAGNOSIS — J9601 Acute respiratory failure with hypoxia: Secondary | ICD-10-CM

## 2014-11-14 LAB — BASIC METABOLIC PANEL
ANION GAP: 7 (ref 5–15)
BUN: 12 mg/dL (ref 6–20)
CHLORIDE: 90 mmol/L — AB (ref 101–111)
CO2: 34 mmol/L — ABNORMAL HIGH (ref 22–32)
CREATININE: 0.62 mg/dL (ref 0.44–1.00)
Calcium: 8.3 mg/dL — ABNORMAL LOW (ref 8.9–10.3)
GFR calc non Af Amer: >60 mL/min (ref >60)
Glucose, Bld: 123 mg/dL — ABNORMAL HIGH (ref 65–99)
Potassium: 3.2 mmol/L — ABNORMAL LOW (ref 3.5–5.1)
SODIUM: 131 mmol/L — AB (ref 135–145)

## 2014-11-14 LAB — CBC
HCT: 26.8 % — ABNORMAL LOW (ref 36.0–46.0)
HEMOGLOBIN: 9.2 g/dL — AB (ref 12.0–15.0)
MCH: 33.7 pg (ref 26.0–34.0)
MCHC: 34.3 g/dL (ref 30.0–36.0)
MCV: 98.2 fL (ref 78.0–100.0)
Platelets: 186 10*3/uL (ref 150–400)
RBC: 2.73 MIL/uL — AB (ref 3.87–5.11)
RDW: 14.8 % (ref 11.5–15.5)
WBC: 6.9 10*3/uL (ref 4.0–10.5)

## 2014-11-14 LAB — D-DIMER, QUANTITATIVE (NOT AT ARMC): D DIMER QUANT: 0.49 ug{FEU}/mL — AB (ref 0.00–0.48)

## 2014-11-14 LAB — TROPONIN I

## 2014-11-14 LAB — PROCALCITONIN: Procalcitonin: <0.1 ng/mL

## 2014-11-14 MED ORDER — MAGNESIUM SULFATE 2 GM/50ML IV SOLN
2.0000 g | Freq: Once | INTRAVENOUS | Status: AC
  Administered 2014-11-14: 2 g via INTRAVENOUS
  Filled 2014-11-14: qty 50

## 2014-11-14 MED ORDER — FUROSEMIDE 10 MG/ML IJ SOLN
20.0000 mg | Freq: Once | INTRAMUSCULAR | Status: AC
  Administered 2014-11-14: 20 mg via INTRAVENOUS
  Filled 2014-11-14: qty 2

## 2014-11-14 MED ORDER — ASPIRIN 81 MG PO CHEW
81.0000 mg | CHEWABLE_TABLET | Freq: Every day | ORAL | Status: DC
  Administered 2014-11-15 – 2014-11-21 (×7): 81 mg via ORAL
  Filled 2014-11-14 (×7): qty 1

## 2014-11-14 MED ORDER — POTASSIUM CHLORIDE CRYS ER 20 MEQ PO TBCR
40.0000 meq | EXTENDED_RELEASE_TABLET | Freq: Once | ORAL | Status: AC
  Administered 2014-11-14: 40 meq via ORAL
  Filled 2014-11-14: qty 2

## 2014-11-14 NOTE — Progress Notes (Signed)
UR Completed. Abigaile Rossie, RN, BSN.  [phone] 

## 2014-11-14 NOTE — H&P (Signed)
PULMONARY / CRITICAL CARE MEDICINE   Name: Miranda Hines Noone MRN: [id number] DOB: 31-Aug-1940    ADMISSION DATE:  11/13/2014 CONSULTATION DATE:  11/13/2014  REFERRING MD :  Kendrick Fries  CHIEF COMPLAINT:  Shortness of breath  INITIAL PRESENTATION:  Mrs. Doswell was referred to me today for evaluation of pulmonary hypertension. She has a history of interstitial lung disease which is poorly understood. She has been followed by our partners here in the office and apparently has had some response to steroids in the past. 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 Apparently at one point she did work in her room which previously contained cobalt but she never worked directly with it or other chemicals. However, she's had shortness of breath off and on for several years but she's been aware of her diagnosis of pulmonary fibrosis for 2 years. She says that in the last 6 months she's been experiencing increasing shortness of breath and symptoms. She has also noted that her oxygen level has been dropping more frequently. Our office staff notes that frequently when she would come for visit several months ago that her O2 saturation would commonly be in the 70s range on room air when ambulating.  However, she's had progression of shortness of breath associated with increasing leg swelling in the last several weeks. She was hospitalized earlier this month and was treated with IV Lasix which improved her symptoms. She did up having a right heart catheterization on 11/08/2014 which showed a pulmonary arterial pressure of 45/19 (mean 32), pulmonary capillary wedge pressure of 8. Pulmonary arterial oxygen saturation was 62%, arterial O2 saturation 100%. Cardiac output was 3.69 by Fick, cardiac index 2.15. Pulmonary vascular resistance was 6.5 Woods units.  For the last 3-4 days she's had progressive cough, weakness, mucus production,  and chills. She says this is associated with a mild right-sided chest pain. She's been significantly more short of breath during this time. She's also noted increasing leg swelling.     STUDIES:  Chest x-ray November 1>> unchanged ILD  SUBJECTIVE:  11/14/2014 - reports being marginally better. Hx review - on day of right heart cath felt cold which lasted 2-3 days then diaphoresis and mild yellow sputuma nd possible LLE edema for 2 prior to admission. Feelingmore dyspneic but no change in o2 need of 3-4 L. Completed her prednisone given 3 weeks ago. Still on daily lasix   VITAL SIGNS: Temp:  [98.4 F (36.9 C)-99.6 F (37.6 C)] 99.1 F (37.3 C) (11/02 0446) Pulse Rate:  [85-107] 85 (11/02 0446) Resp:  [22-24] 24 (11/02 0446) BP: (107-133)/(60-79) 107/79 mmHg (11/02 0446) SpO2:  [92 %-98 %] 92 % (11/02 0446) Weight:  [97.886 kg (215 lb 12.8 oz)-98.612 kg (217 lb 6.4 oz)] 97.886 kg (215 lb 12.8 oz) (11/02 0446) HEMODYNAMICS:   VENTILATOR SETTINGS:   INTAKE / OUTPUT:  Intake/Output Summary (Last 24 hours) at 11/14/14 1009 Last data filed at 11/14/14 0900  Gross per 24 hour  Intake    480 ml  Output   1000 ml  Net   -520 ml    PHYSICAL EXAMINATION: Gen: chronically ill appearing, mild respiratory distress HENT: OP clear, neck supple PULM: crackles 1/2 way up bilaterally, increased effort CV: Tachy, regular, no mgr, notable ankle edema  - seems baseline GI: BS+, soft, nontender Derm: no cyanosis or rash Psyche: normal mood and affect  LABS:  CBC  Recent Labs Lab 11/13/14 1919 11/14/14  0558  WBC 8.3 6.9  HGB 9.0* 9.2*  HCT 27.9* 26.8*  PLT 239 186   Coag's No results for input(s): APTT, INR in the last 168 hours. BMET  Recent Labs Lab 11/13/14 1919 11/14/14 0558  NA 133* 131*  K 3.6 3.2*  CL 91* 90*  CO2 31 34*  BUN 14 12  CREATININE 0.59 0.62  GLUCOSE 138* 123*   Electrolytes  Recent Labs Lab 11/13/14 1919 11/14/14 0558  CALCIUM 8.5* 8.3*  MG  1.7  --   PHOS 3.2  --    Sepsis Markers  Recent Labs Lab 11/13/14 1919  PROCALCITON <0.10   ABG No results for input(s): PHART, PCO2ART, PO2ART in the last 168 hours. Liver Enzymes  Recent Labs Lab 11/13/14 1919  AST 28  ALT 18  ALKPHOS 71  BILITOT 1.2  ALBUMIN 2.9*   Cardiac Enzymes  Recent Labs Lab 11/13/14 1919 11/13/14 2320 11/14/14 0558  TROPONINI <0.03 <0.03 <0.03   Glucose No results for input(s): GLUCAP in the last 168 hours.  Imaging Dg Chest Port 1 View  11/13/2014  CLINICAL DATA:  Wheezing and shortness of breath for 1 day EXAM: PORTABLE CHEST 1 VIEW COMPARISON:  October 20, 2014 chest radiograph and chest CT FINDINGS: There is widespread interstitial fibrotic change which is stable. No new opacity is appreciable. No frank edema or consolidation is demonstrable. The heart size and pulmonary vascularity are normal. There is no demonstrable adenopathy. IMPRESSION: Widespread interstitial fibrotic type change, stable. No new opacity. No change in cardiac silhouette. Electronically Signed   By: Bretta Bang III M.Hines.   On: 11/13/2014 18:06     ASSESSMENT / PLAN:  PULMONARY  A: Acute hypoxemic respiratory failure: on chronic resp failure due to ILD NOS     - DDx - acute bronchitis - most likely and given ILD NOS feeling more dyspneic. Objectively she reports hyppxemioa not worse   - HCAP - doubt givne normal PCT and unchnaged CXR   - Mild diast dysfn - doubt given cath findings a week ago   - ILD Flare and PE - lower prob right now  P:   Walk test on 3L Titrated to as needed for O2 greater than 90% Blood cultures await Continue antibiotic therapy (PICA mycin and ceftaz) Chest x-ray Diuretic therapy 2 doses overnight completed -> do 3rd dose + daily scheduled po lasix Check Hines-dimer and duplex LE - depending on this + course CT angio chest rule out PE Hold off steroid burst for now for ILD flare    CARDIOVASCULAR A: Pulmonary hypertension: I  explained to her that this is clearly a classic WHO group 3   P:  Revatio 20 mg 3 times a day, started by Dr Kendrick Fries 11/13/2014 as opd authorization in progress. Rx with inpatient regimen now  RENAL A:  Mild hypokalemia and hypomagnesemia P:   replete Monitor BMET and UOP Replace electrolytes as needed   GASTROINTESTINAL A:  No acute issues P:   Regular diet  HEMATOLOGIC A:  No acute issues P:  Subcutaneous heparin for DVT prophylaxis  INFECTIOUS A:  Healthcare associated pneumonia - doubt  P:   BCx2 11/13/2014  Vancomycin 11/13/2014 Ceftaz 11/13/2014   Recheck PPCT continue abx fofr now and narrow depending on PCT and course  ENDOCRINE A:  No acute issues  P:   Monitor glucose  NEUROLOGIC A:  No acute issues P:     FAMILY  - Updates: patient at bedside  Dr. Kalman ShanMurali Telford Archambeau, M.Hines., [facility].C.P Pulmonary and Critical Care Medicine Staff Physician [facility] [place] Pager: [phone], If no answer or between  15:00h - 7:00h: call [phone]  11/14/2014 10:09 AM

## 2014-11-14 NOTE — Progress Notes (Signed)
Patient ambulation on 3L of oxygen.       Pre-walk   O2 sat  91%    HR   81       After 45 feet  O2 sat dropped quickly to 71%   HR  98   Pt c/o heaviness in legs       Pt did not recover O2 sat above 80% until after return to room and seated.       5 minutes post walk    O2 sat 92%  HR 86  Note: O2 sat higher when taken on ear-- 95%  (when finger 92%)

## 2014-11-14 NOTE — Care Management Note (Addendum)
Case Management Note  Patient Details  Name: Miranda Hines MRN: [id number] Date of Birth: 05/31/1940  Subjective/Objective:   Pt admitted with acute respiratory failure                 Action/Plan:  Pt states she is independent from home with home O2.  Pt lives with both adult son and husband (husband was placed in SNF for rehab post a fall on 10/20/14). Pt has been started on Revatio, CM will submit benefits check.  MD has already initiated the prior authorization process.  CM will continue to monitor for disposition needs.   Expected Discharge Date:                  Expected Discharge Plan:  Home/Self Care  In-House Referral:     Discharge planning Services  CM Consult  Post Acute Care Choice:    Choice offered to:     DME Arranged:    DME Agency:     HH Arranged:    HH Agency:     Status of Service:  In process, will continue to follow  Medicare Important Message Given:    Date Medicare IM Given:    Medicare IM give by:    Date Additional Medicare IM Given:    Additional Medicare Important Message give by:     If discussed at Long Length of Stay Meetings, dates discussed:    Additional Comments: CM assessed pt Miranda Hines, Miranda Dinapoli S, RN 11/14/2014, 1:58 PM

## 2014-11-15 ENCOUNTER — Inpatient Hospital Stay (HOSPITAL_COMMUNITY): Payer: Medicare Other

## 2014-11-15 ENCOUNTER — Encounter (HOSPITAL_COMMUNITY): Payer: Medicare Other

## 2014-11-15 DIAGNOSIS — R609 Edema, unspecified: Secondary | ICD-10-CM

## 2014-11-15 LAB — BASIC METABOLIC PANEL
ANION GAP: 8 (ref 5–15)
BUN: 14 mg/dL (ref 6–20)
CALCIUM: 8.3 mg/dL — AB (ref 8.9–10.3)
CO2: 33 mmol/L — ABNORMAL HIGH (ref 22–32)
Chloride: 93 mmol/L — ABNORMAL LOW (ref 101–111)
Creatinine, Ser: 0.56 mg/dL (ref 0.44–1.00)
GFR calc Af Amer: >60 mL/min (ref >60)
GLUCOSE: 104 mg/dL — AB (ref 65–99)
Potassium: 3.7 mmol/L (ref 3.5–5.1)
SODIUM: 134 mmol/L — AB (ref 135–145)

## 2014-11-15 LAB — MAGNESIUM: Magnesium: 2.1 mg/dL (ref 1.7–2.4)

## 2014-11-15 LAB — PHOSPHORUS: Phosphorus: 3.1 mg/dL (ref 2.5–4.6)

## 2014-11-15 NOTE — Care Management Note (Signed)
Case Management Note  Patient Details  Name: Miranda Hines MRN: [id number] Date of Birth: Feb 10, 1940  Subjective/Objective:   Pt admitted with acute respiratory failure                 Action/Plan:  Pt states she is independent from home with home O2.  Pt lives with both adult son and husband (husband was placed in SNF for rehab post a fall on 10/20/14). Pt has been started on Revatio, CM will submit benefits check.  MD has already initiated the prior authorization process.  CM will continue to monitor for disposition needs.   Expected Discharge Date:                  Expected Discharge Plan:  Home/Self Care  In-House Referral:     Discharge planning Services  CM Consult  Post Acute Care Choice:    Choice offered to:     DME Arranged:    DME Agency:     HH Arranged:    HH Agency:     Status of Service:  In process, will continue to follow  Medicare Important Message Given:    Date Medicare IM Given:    Medicare IM give by:    Date Additional Medicare IM Given:    Additional Medicare Important Message give by:     If discussed at Long Length of Stay Meetings, dates discussed:    Additional Comments: CM assessed pt.  Benefit check submitted; per pt she can afford to pay the $45 copay.  Pt stated her preferred pharmacy is [facility] [address], CM contacted pharmacy and was informed that generic form of medication is available for pickup.  CM provided both CM note and Physician Sticky Note (summary tab) informing MD of benefit check results:  Per pt copay for generic will be 29% co insurance- generic copay will be $45 -prior auth required [id number]  Cherylann ParrClaxton, Breya Cass S, RN 11/15/2014, 2:58 PM

## 2014-11-15 NOTE — Care Management (Signed)
Revation Alert:  Per pt copay for generic will be 29% co insurance- generic copay will be $45 -prior auth required [id number]

## 2014-11-15 NOTE — H&P (Signed)
PULMONARY / CRITICAL CARE MEDICINE   Name: Miranda Hines MRN: [id number] DOB: 06-23-1940    ADMISSION DATE:  11/13/2014 CONSULTATION DATE:  11/13/2014  REFERRING MD :  Kendrick FriesMcQuaid  CHIEF COMPLAINT:  Shortness of breath  INITIAL PRESENTATION:  Miranda Hines was referred to me today for evaluation of pulmonary hypertension. She has a history of interstitial lung disease which is poorly understood. She has been followed by our partners here in the office and apparently has had some response to steroids in the past. 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 Apparently at one point she did work in her room which previously contained cobalt but she never worked directly with it or other chemicals. However, she's had shortness of breath off and on for several years but she's been aware of her diagnosis of pulmonary fibrosis for 2 years. She says that in the last 6 months she's been experiencing increasing shortness of breath and symptoms. She has also noted that her oxygen level has been dropping more frequently. Our office staff notes that frequently when she would come for visit several months ago that her O2 saturation would commonly be in the 70s range on room air when ambulating.  However, she's had progression of shortness of breath associated with increasing leg swelling in the last several weeks. She was hospitalized earlier this month and was treated with IV Lasix which improved her symptoms. She did up having a right heart catheterization on 11/08/2014 which showed a pulmonary arterial pressure of 45/19 (mean 32), pulmonary capillary wedge pressure of 8. Pulmonary arterial oxygen saturation was 62%, arterial O2 saturation 100%. Cardiac output was 3.69 by Fick, cardiac index 2.15. Pulmonary vascular resistance was 6.5 Woods units.  For the last 3-4 days she's had progressive cough, weakness, mucus production,  and chills. She says this is associated with a mild right-sided chest pain. She's been significantly more short of breath during this time. She's also noted increasing leg swelling.     STUDIES:  Chest x-ray November 1>> unchanged ILD    11/14/2014 - reports being marginally better. Hx review - on day of right heart cath felt cold which lasted 2-3 days then diaphoresis and mild yellow sputuma nd possible LLE edema for 2 prior to admission. Feelingmore dyspneic but no change in o2 need of 3-4 L. Completed her prednisone given 3 weeks ago. Still on daily lasix   SUBJECTIVE/OVERNIGHT/INTERVAL HX 11/15/2014 - no better. Easy dyspnea going to bathroom and back. In fact feels worse. DUplex LE pending. Walking on 3L - 45 feet desaturated to 71%. D-dimer 0.49 - low prob for PE     VITAL SIGNS: Temp:  [98.4 F (36.9 C)-98.7 F (37.1 C)] 98.6 F (37 C) (11/03 0515) Pulse Rate:  [84-99] 99 (11/03 0515) Resp:  [17-20] 20 (11/03 0515) BP: (103-132)/(52-64) 132/60 mmHg (11/03 0515) SpO2:  [90 %-96 %] 90 % (11/03 0515) Weight:  [98.612 kg (217 lb 6.4 oz)] 98.612 kg (217 lb 6.4 oz) (11/03 0510) HEMODYNAMICS:   VENTILATOR SETTINGS:   INTAKE / OUTPUT:  Intake/Output Summary (Last 24 hours) at 11/15/14 0847 Last data filed at 11/15/14 0513  Gross per 24 hour  Intake    480 ml  Output    975 ml  Net   -495 ml    PHYSICAL EXAMINATION: Gen: chronically ill appearing, mild respiratory distress, lying in bed HENT: OP clear, neck supple PULM: crackles 1/2 way up bilaterally,  CV: Tachy, regular, no mgr, notable ankle edema  GI: BS+, soft, nontender Derm: no cyanosis or rash Psyche: looks flat today LABS:  CBC  Recent Labs Lab 11/13/14 1919 11/14/14 0558  WBC 8.3 6.9  HGB 9.0* 9.2*  HCT 27.9* 26.8*  PLT 239 186   Coag's No results for input(s): APTT, INR in the last 168 hours. BMET  Recent Labs Lab 11/13/14 1919 11/14/14 0558 11/15/14 0332  NA 133* 131* 134*  K 3.6 3.2*  3.7  CL 91* 90* 93*  CO2 31 34* 33*  BUN CREATININE 0.59 0.62 0.56  GLUCOSE 138* 123* 104*   Electrolytes  Recent Labs Lab 11/13/14 1919 11/14/14 0558 11/15/14 0332  CALCIUM 8.5* 8.3* 8.3*  MG 1.7  --  2.1  PHOS 3.2  --  3.1   Sepsis Markers  Recent Labs Lab 11/13/14 1919 11/14/14 1052  PROCALCITON <0.10 <0.10   ABG No results for input(s): PHART, PCO2ART, PO2ART in the last 168 hours. Liver Enzymes  Recent Labs Lab 11/13/14 1919  AST 28  ALT 18  ALKPHOS 71  BILITOT 1.2  ALBUMIN 2.9*   Cardiac Enzymes  Recent Labs Lab 11/13/14 1919 11/13/14 2320 11/14/14 0558  TROPONINI <0.03 <0.03 <0.03   Glucose No results for input(s): GLUCAP in the last 168 hours.  Imaging No results found.   ASSESSMENT / PLAN:  PULMONARY  A: Acute hypoxemic respiratory failure: on chronic resp failure due to ILD NOS     - DDx - acute bronchitis - most likely and given ILD NOS feeling more dyspneic. Objectively she reports hyppxemioa not worse   - HCAP - doubt givne normal PCT and unchnaged CXR   - Mild diast dysfn - doubt given cath findings a week ago   - ILD Flare and PE - lower prob right now  No improvement with broad antibiotics. At this point will have to reassess ILD. I am more worried she has progressive ILD v ILLD flare She had easy desats to 71% on 3L o2. 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 Rx with inpatient  regimen now  RENAL A:  Mild hypokalemia and hypomagnesemia - Rx 11/14/14 and resolved P:   replete Monitor BMET and UOP Replace electrolytes as needed   GASTROINTESTINAL A:  No acute issues P:   Regular diet  HEMATOLOGIC A:  No acute issues P:  Subcutaneous heparin for DVT prophylaxis  INFECTIOUS  Recent Labs Lab 11/13/14 1919 11/14/14 1052  PROCALCITON <0.10 <0.10    A:  Healthcare associated pneumonia - doubt now based on clinical ongoing profile and negative PCT P:   BCx2 11/13/2014  Vancomycin 11/13/2014 > 11/15/14 Ceftaz 11/13/2014   If CT does not show HCAP will dc ceftaz  ENDOCRINE A:  No acute issues  P:   Monitor glucose  NEUROLOGIC A:  No acute issues P:     FAMILY  -  Updates: patient at bedside    Dr. Kalman Shan, M.D., [facility].C.P Pulmonary and Critical Care Medicine Staff Physician [facility] [place] [facility]monary and Critical Care Pager: [phone], If no answer or between  15:00h - 7:00h: call [phone]  11/15/2014 8:47 AM

## 2014-11-15 NOTE — Progress Notes (Signed)
Pt ambulation on 6L of oxygen:  Pre walk on 4L-  02 sat 96% pulse 89  After completing 150 feet of walking on 6L of 02- 02 sat 78% with pulse of 102  Returned to seated position on bed (2:28 pm) 02 sat 73% pulse 92  Recheck @ 2:34 pm- returned to 4L- 02 sat 91% with pulse of 84 Per Pt, she feels that she was able to tolerate that distance better than she was able to tolerate it yesterday 11/14/2014.

## 2014-11-15 NOTE — Progress Notes (Signed)
VASCULAR LAB PRELIMINARY  PRELIMINARY  PRELIMINARY  PRELIMINARY  Bilateral lower extremity venous duplex completed.    Preliminary report:  There is no DVT or SVT noted in the bilateral lower extremities.   Raenell Mensing, RVT 11/15/2014, 1:21 PM

## 2014-11-16 DIAGNOSIS — J849 Interstitial pulmonary disease, unspecified: Secondary | ICD-10-CM | POA: Insufficient documentation

## 2014-11-16 LAB — BASIC METABOLIC PANEL
Anion gap: 11 (ref 5–15)
BUN: 14 mg/dL (ref 6–20)
CALCIUM: 8.7 mg/dL — AB (ref 8.9–10.3)
CO2: 33 mmol/L — AB (ref 22–32)
CREATININE: 0.54 mg/dL (ref 0.44–1.00)
Chloride: 92 mmol/L — ABNORMAL LOW (ref 101–111)
GFR calc non Af Amer: >60 mL/min (ref >60)
GLUCOSE: 113 mg/dL — AB (ref 65–99)
Potassium: 3.5 mmol/L (ref 3.5–5.1)
Sodium: 136 mmol/L (ref 135–145)

## 2014-11-16 LAB — MAGNESIUM: Magnesium: 1.9 mg/dL (ref 1.7–2.4)

## 2014-11-16 LAB — PHOSPHORUS: PHOSPHORUS: 3.2 mg/dL (ref 2.5–4.6)

## 2014-11-16 LAB — PROCALCITONIN: Procalcitonin: <0.1 ng/mL

## 2014-11-16 MED ORDER — TRAMADOL HCL 50 MG PO TABS
50.0000 mg | ORAL_TABLET | Freq: Two times a day (BID) | ORAL | Status: DC | PRN
  Administered 2014-11-16 – 2014-11-21 (×8): 50 mg via ORAL
  Filled 2014-11-16 (×8): qty 1

## 2014-11-16 MED ORDER — IPRATROPIUM-ALBUTEROL 0.5-2.5 (3) MG/3ML IN SOLN
3.0000 mL | Freq: Four times a day (QID) | RESPIRATORY_TRACT | Status: DC
  Administered 2014-11-16: 3 mL via RESPIRATORY_TRACT
  Filled 2014-11-16: qty 3

## 2014-11-16 MED ORDER — MAGNESIUM SULFATE IN D5W 10-5 MG/ML-% IV SOLN
1.0000 g | Freq: Once | INTRAVENOUS | Status: AC
  Administered 2014-11-16: 1 g via INTRAVENOUS
  Filled 2014-11-16 (×2): qty 100

## 2014-11-16 MED ORDER — IPRATROPIUM-ALBUTEROL 0.5-2.5 (3) MG/3ML IN SOLN
3.0000 mL | RESPIRATORY_TRACT | Status: DC
  Administered 2014-11-16 – 2014-11-17 (×6): 3 mL via RESPIRATORY_TRACT
  Filled 2014-11-16 (×6): qty 3

## 2014-11-16 MED ORDER — LEVOFLOXACIN 500 MG PO TABS
500.0000 mg | ORAL_TABLET | Freq: Every day | ORAL | Status: AC
  Administered 2014-11-16 – 2014-11-17 (×2): 500 mg via ORAL
  Filled 2014-11-16 (×2): qty 1

## 2014-11-16 MED ORDER — METHYLPREDNISOLONE SODIUM SUCC 125 MG IJ SOLR
60.0000 mg | Freq: Four times a day (QID) | INTRAMUSCULAR | Status: DC
  Administered 2014-11-16 – 2014-11-19 (×13): 60 mg via INTRAVENOUS
  Filled 2014-11-16 (×13): qty 2

## 2014-11-16 NOTE — Progress Notes (Signed)
PULMONARY / CRITICAL CARE MEDICINE   Name: Miranda Hines MRN: [id number] DOB: 1940-02-01    ADMISSION DATE:  11/13/2014 CONSULTATION DATE:  11/13/2014  REFERRING MD :  Kendrick Fries  CHIEF COMPLAINT:  Shortness of breath  INITIAL PRESENTATION:  Miranda Hines was referred to me today for evaluation of pulmonary hypertension. She has a history of interstitial lung disease which is poorly understood. She has been followed by our partners here in the office and apparently has had some response to steroids in the past. 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 Apparently at one point she did work in her room which previously contained cobalt but she never worked directly with it or other chemicals. However, she's had shortness of breath off and on for several years but she's been aware of her diagnosis of pulmonary fibrosis for 2 years. She says that in the last 6 months she's been experiencing increasing shortness of breath and symptoms. She has also noted that her oxygen level has been dropping more frequently. Our office staff notes that frequently when she would come for visit several months ago that her O2 saturation would commonly be in the 70s range on room air when ambulating.  However, she's had progression of shortness of breath associated with increasing leg swelling in the last several weeks. She was hospitalized earlier this month and was treated with IV Lasix which improved her symptoms. She did up having a right heart catheterization on 11/08/2014 which showed a pulmonary arterial pressure of 45/19 (mean 32), pulmonary capillary wedge pressure of 8. Pulmonary arterial oxygen saturation was 62%, arterial O2 saturation 100%. Cardiac output was 3.69 by Fick, cardiac index 2.15. Pulmonary vascular resistance was 6.5 Woods units.  For the last 3-4 days she's had progressive cough, weakness, mucus production,  and chills. She says this is associated with a mild right-sided chest pain. She's been significantly more short of breath during this time. She's also noted increasing leg swelling.     STUDIES:  Chest x-ray November 1>> unchanged ILD    11/14/2014 - reports being marginally better. Hx review - on day of right heart cath felt cold which lasted 2-3 days then diaphoresis and mild yellow sputuma nd possible LLE edema for 2 prior to admission. Feelingmore dyspneic but no change in o2 need of 3-4 L. Completed her prednisone given 3 weeks ago. Still on daily lasix   SUBJECTIVE/OVERNIGHT/INTERVAL HX Awake and alert     VITAL SIGNS: Temp:  [97.9 F (36.6 C)-99.3 F (37.4 C)] 99.3 F (37.4 C) (11/04 0425) Pulse Rate:  [88-91] 91 (11/04 0425) Resp:  [18-20] 20 (11/04 0425) BP: (105-109)/(52-62) 108/52 mmHg (11/04 0425) SpO2:  [90 %-93 %] 91 % (11/04 0425) Weight:  [218 lb 1.6 oz (98.93 kg)] 218 lb 1.6 oz (98.93 kg) (11/04 0500) HEMODYNAMICS:   VENTILATOR SETTINGS:   INTAKE / OUTPUT:  Intake/Output Summary (Last 24 hours) at 11/16/14 1225 Last data filed at 11/16/14 0615  Gross per 24 hour  Intake    360 ml  Output    800 ml  Net   -440 ml    PHYSICAL EXAMINATION: Gen: chronically ill appearing, mild respiratory distress, better sitting up HENT: OP clear, neck supple PULM: mild craclkes, +VCD component CV: Tachy, regular, no mgr, notable ankle edema  GI: BS+, soft, nontender Derm: no cyanosis or rash Psyche: Awake and wants breathing treatment LABS:  CBC  Recent Labs Lab 11/13/14  1919 11/14/14 0558  WBC 8.3 6.9  HGB 9.0* 9.2*  HCT 27.9* 26.8*  PLT 239 186   Coag's No results for input(s): APTT, INR in the last 168 hours. BMET  Recent Labs Lab 11/14/14 0558 11/15/14 0332 11/16/14 0435  NA 131* 134* 136  K 3.2* 3.7 3.5  CL 90* 93* 92*  CO2 34* 33* 33*  BUN CREATININE 0.62 0.56 0.54  GLUCOSE 123* 104* 113*   Electrolytes  Recent Labs Lab  11/13/14 1919 11/14/14 0558 11/15/14 0332 11/16/14 0435  CALCIUM 8.5* 8.3* 8.3* 8.7*  MG 1.7  --  2.1 1.9  PHOS 3.2  --  3.1 3.2   Sepsis Markers  Recent Labs Lab 11/13/14 1919 11/14/14 1052 11/16/14 0435  PROCALCITON <0.10 <0.10 <0.10   ABG No results for input(s): PHART, PCO2ART, PO2ART in the last 168 hours. Liver Enzymes  Recent Labs Lab 11/13/14 1919  AST 28  ALT 18  ALKPHOS 71  BILITOT 1.2  ALBUMIN 2.9*   Cardiac Enzymes  Recent Labs Lab 11/13/14 1919 11/13/14 2320 11/14/14 0558  TROPONINI <0.03 <0.03 <0.03   Glucose No results for input(s): GLUCAP in the last 168 hours.  Imaging Ct Chest High Resolution  11/15/2014  CLINICAL DATA:  74 year old female with shortness of breath. History of pulmonary fibrosis. EXAM: CT CHEST WITHOUT CONTRAST TECHNIQUE: Multidetector CT imaging of the chest was performed following the standard protocol without intravenous contrast. High resolution imaging of the lungs, as well as inspiratory and expiratory imaging, was performed. COMPARISON:  Chest CT 10/20/2014. FINDINGS: Mediastinum/Lymph Nodes: Heart size is borderline enlarged. There is no significant pericardial fluid, thickening or pericardial calcification. There is atherosclerosis of the thoracic aorta, the great vessels of the mediastinum and the coronary arteries, including calcified atherosclerotic plaque in the left anterior descending and left circumflex coronary arteries. Faint calcifications of the aortic valve. Severe dilatation of the pulmonic trunk (4.4 cm in diameter), suggesting pulmonary arterial hypertension. Multiple borderline enlarged and mildly enlarged mediastinal and bilateral hilar lymph nodes, measuring up to 13 mm in short axis in the low right paratracheal nodal station. Esophagus is unremarkable in appearance. No axillary lymphadenopathy. Lungs/Pleura: High-resolution images again demonstrate patchy areas of ground-glass attenuation, cylindrical and  varicose bronchiectasis with extensive thickening of the peribronchovascular interstitium and associated regional areas of architectural distortion. This appears randomly distributed throughout the lungs bilaterally, with no clear craniocaudal gradient. No definite areas of honeycombing are identified. Inspiratory and expiratory images demonstrate several scattered areas of air trapping, indicative of small airways disease. Overall, the extent of pulmonary fibrosis has increased compared to the prior examination, particularly in the upper lungs. No acute consolidative airspace disease. No pleural effusions. Upper abdomen: Unremarkable. Musculoskeletal: There are no aggressive appearing lytic or blastic lesions noted in the visualized portions of the skeleton. IMPRESSION: 1. The appearance of the chest is again compatible with interstitial lung disease, and the overall pattern is favored to represent progressively worsening chronic hypersensitivity pneumonitis based on the spectrum of findings and presence of some air trapping on today's examination. 2. Dilatation of the pulmonic trunk again noted, suggestive of pulmonary arterial hypertension. 3. Atherosclerosis, including 2 vessel coronary artery disease. Assessment for potential risk factor modification, dietary therapy or pharmacologic therapy may be warranted, if clinically indicated. 4. Additional incidental findings, as above. Electronically Signed   By: Trudie Reed M.D.   On: 11/15/2014 16:51     ASSESSMENT / PLAN:  PULMONARY  A: Acute hypoxemic respiratory failure:  on chronic resp failure due to ILD NOS     - DDx - acute bronchitis - most likely and given ILD NOS feeling more dyspneic. Objectively she reports hyppxemioa not worse   - HCAP - doubt givne normal PCT and unchnaged CXR   - Mild diast dysfn - doubt given cath findings a week ago   - ILD Flare and PE - lower prob right now  No improvement with broad antibiotics. At this point  will have to reassess ILD. I am more worried she has progressive ILD v ILLD flare She had easy desats to 71% on 3L o2. 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 Rx with inpatient regimen now  RENAL A:  Mild hypokalemia and hypomagnesemia - Rx 11/14/14 and resolved P:   replete Monitor BMET and UOP Replace electrolytes as needed   GASTROINTESTINAL A:  No acute issues P:   Regular diet  HEMATOLOGIC A:  No acute issues P:  Subcutaneous heparin for DVT prophylaxis  INFECTIOUS  Recent Labs Lab 11/13/14 1919 11/14/14 1052 11/16/14 0435  PROCALCITON <0.10 <0.10 <0.10    A:  Healthcare associated pneumonia - doubt now based on clinical ongoing profile and negative PCT P:   BCx2 11/13/2014  Vancomycin 11/13/2014 > 11/15/14 Ceftaz 11/13/2014>>11/4   CT does not show HCAP will dc Azactam   ENDOCRINE A:  No acute issues  P:   Monitor glucose  NEUROLOGIC A:  No acute issues P:     FAMILY  - Updates: patient at bedside and family  Global: better but not at baseline   Brett CanalesSteve Shterna Laramee ACNP [facility] Pager [phone] till 3 pm If no answer page [phone] 11/16/2014, 12:25 PM

## 2014-11-16 NOTE — Care Management Note (Signed)
Case Management Note  Patient Details  Name: Miranda Hines MRN: [id number] Date of Birth: October 17, 1940  Subjective/Objective:   Pt admitted with acute respiratory failure                 Action/Plan:  Pt states she is independent from home with home O2.  Pt lives with both adult son and husband (husband was placed in SNF for rehab post a fall on 10/20/14). Pt has been started on Revatio, CM will submit benefits check.  MD has already initiated the prior authorization process.  CM will continue to monitor for disposition needs.   Expected Discharge Date:                  Expected Discharge Plan:  Home/Self Care  In-House Referral:     Discharge planning Services  CM Consult  Post Acute Care Choice:    Choice offered to:     DME Arranged:    DME Agency:     HH Arranged:    HH Agency:     Status of Service:  In process, will continue to follow  Medicare Important Message Given:  Yes-second notification given Date Medicare IM Given:    Medicare IM give by:    Date Additional Medicare IM Given:    Additional Medicare Important Message give by:     If discussed at Long Length of Stay Meetings, dates discussed:    Additional Comments: 11/16/2014 MD acknowledged CM concern and agreed to PT/OT evaluation, evaluation ordered.  CM will continue to monitor for disposition needs.  CM re-assessed pt with daughter at bedside.  Pt plans to return home with son, son can provide 24 hour supervision however pt is not able to perform ADLS at this time.  Per daughter; pts [facility] to give pt time to recoup prior to pt having to resuming care for spouse.  Pt remains out of breath from conversation.  CM spoke in detail with both pt and daughter regarding concerns with pt returning home with current oxygenation issue and inability to perform ADLs.  Pt and daughter are open to the idea of possible SNF associated rehab.  CM consulted with CSW and will communicate  concern to MD and request PT/OT eval.  CM will continue to monitor for disposition needs.  10/15/14 CM assessed pt.  Benefit check submitted; per pt she can afford to pay the $45 copay.  Pt stated her preferred pharmacy is [facility] [address], CM contacted pharmacy and was informed that generic form of medication is available for pickup.  CM provided both CM note and Physician Sticky Note (summary tab) informing MD of benefit check results:  Per pt copay for generic will be 29% co insurance- generic copay will be $45 -prior auth required [id number]  Cherylann ParrClaxton, Lameisha Schuenemann S, RN 11/16/2014, 11:59 AM

## 2014-11-16 NOTE — Evaluation (Signed)
Occupational Therapy Evaluation Patient Details Name: Miranda Hines MRN: [id number] DOB: 02-08-1940 Today's Date: 11/16/2014    History of Present Illness  This 74 y.o. Female admitted with progressive cough, weakness, chills and Rt sided chest pain as well as LE edema.  Dx: acute hypoxemic respiratory failure on chronic respiratory failure due to interstitial lung disease and idiopathic pulmonary fibrosis   Clinical Impression   Pt admitted with above. She demonstrates the below listed deficits and will benefit from continued OT to maximize safety and independence with BADLs.   Pt presents with generalized weakness and deconditioning.  She requires supervision - min A for ADLs.  She requires multiple rest breaks and destats into the low to high 60s on 6L 02.  She slowly rebounds back into the 90s after 4-5 mins of pursed lip breathing.   Family is very supportive.   Began review of energy conservation strategies.  Recommend HHOT.        Follow Up Recommendations  Home health OT;Supervision/Assistance - 24 hour    Equipment Recommendations  None recommended by OT    Recommendations for Other Services       Precautions / Restrictions Precautions Precautions: Fall      Mobility Bed Mobility               General bed mobility comments: sitting EOB   Transfers Overall transfer level: Needs assistance Equipment used: Rolling walker (2 wheeled) Transfers: Sit to/from [employer]Stand;Stand Pivot Transfers Sit to Stand: Supervision Stand pivot transfers: Min guard            Balance Overall balance assessment: Needs assistance Sitting-balance support: Feet supported Sitting balance-Leahy Scale: Good     Standing balance support: Bilateral upper extremity supported Standing balance-Leahy Scale: Poor Standing balance comment: reliant on UE support                            ADL Overall ADL's : Needs assistance/impaired Eating/Feeding:  Independent Eating/Feeding Details (indicate cue type and reason): poor apetite, per pt Grooming: Wash/dry hands;Wash/dry face;Oral care;Brushing hair;Set up;Sitting   Upper Body Bathing: Set up;Supervision/ safety;Sitting   Lower Body Bathing: Min guard;Sit to/from stand   Upper Body Dressing : Supervision/safety;Set up;Sitting   Lower Body Dressing: Min guard;Sit to/from stand   Toilet Transfer: Min guard;Ambulation;Comfort height toilet;RW   Toileting- ArchitectClothing Manipulation and Hygiene: Min guard;Sit to/from stand       Functional mobility during ADLs: Min guard;Rolling walker General ADL Comments: Pt desats quickly with minimal activity, but she is aware of drops and monitors 02 sats consistently.   She requires several rest breaks to complete ADL tasks.  Long discussion with pt and daugther re: energy conservation, pacing and appropriate activity level.       Vision     Perception     Praxis      Pertinent Vitals/Pain Pain Assessment: Faces Faces Pain Scale: Hurts little more Pain Location: back  Pain Descriptors / Indicators: Grimacing Pain Intervention(s): Monitored during session     Hand Dominance Right   Extremity/Trunk Assessment Upper Extremity Assessment Upper Extremity Assessment: Generalized weakness   Lower Extremity Assessment Lower Extremity Assessment: Defer to PT evaluation   Cervical / Trunk Assessment Cervical / Trunk Assessment: Normal   Communication Communication Communication: No difficulties   Cognition Arousal/Alertness: Awake/alert Behavior During Therapy: WFL for tasks assessed/performed Overall Cognitive Status: Within Functional Limits for tasks assessed  General Comments       Exercises       Shoulder Instructions      Home Living Family/patient expects to be discharged to:: Private residence   Available Help at Discharge: Family;Available 24 hours/day Type of Home: House Home Access:  Stairs to enter [employer] of Steps: 1 Entrance Stairs-Rails: None Home Layout: One level     Bathroom Shower/Tub: Tub/shower unit;Curtain Shower/tub characteristics: Engineer, building services: Standard     Home Equipment: Bedside commode;Shower seat;Tub bench;Hand held shower head;Hospital bed;Adaptive equipment Adaptive Equipment: Reacher Additional Comments: Family will be alternating to provide 24 hour care      Prior Functioning/Environment Level of Independence: Independent (used 3.5L 02.  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, who has h/o CVA and who recently fell and is now in SNF     OT Diagnosis: Generalized weakness;Acute pain   OT Problem List: Decreased strength;Decreased activity tolerance;Impaired balance (sitting and/or standing);Decreased safety awareness;Decreased knowledge of use of DME or AE;Cardiopulmonary status limiting activity;Obesity;Pain   OT Treatment/Interventions: Self-care/ADL training;Energy conservation;DME and/or AE instruction;Therapeutic activities;Patient/family education;Balance training    OT Goals(Current goals can be found in the care plan section) Acute Rehab OT Goals Patient Stated Goal: to get stronger  OT Goal Formulation: With patient Time For Goal Achievement: 11/30/14 Potential to Achieve Goals: Good ADL Goals Pt/caregiver will Perform Home Exercise Program: Increased strength;Right Upper extremity;Left upper extremity;Independently;With written HEP provided Additional ADL Goal #1: Pt will be independent with energy conservation techniques   OT Frequency: Min 2X/week   Barriers to D/C:            Co-evaluation              End of Session Equipment Utilized During Treatment: Back brace;Oxygen Nurse Communication: Mobility status  Activity Tolerance: Patient limited by fatigue Patient left: in bed;with call bell/phone within reach;with family/visitor present   Time: 1610-9604 OT Time  Calculation (min): 60 min Charges:  OT General Charges $OT Visit: 1 Procedure OT Evaluation $Initial OT Evaluation Tier I: 1 Procedure OT Treatments $Self Care/Home Management : 23-37 mins $Therapeutic Activity: 8-22 mins G-Codes:    [place] Current M 2014-11-22, 9:57 PM

## 2014-11-16 NOTE — Care Management Important Message (Signed)
Important Message  Patient Details  Name: Miranda Hines MRN: [id number] Date of Birth: 02-Nov-1940   Medicare Important Message Given:  Yes-second notification given    Kyla BalzarineShealy, Marishka Rentfrow Abena 11/16/2014, 10:47 AM

## 2014-11-17 DIAGNOSIS — J9600 Acute respiratory failure, unspecified whether with hypoxia or hypercapnia: Secondary | ICD-10-CM | POA: Insufficient documentation

## 2014-11-17 DIAGNOSIS — Z515 Encounter for palliative care: Secondary | ICD-10-CM | POA: Insufficient documentation

## 2014-11-17 LAB — BASIC METABOLIC PANEL
ANION GAP: 8 (ref 5–15)
BUN: 14 mg/dL (ref 6–20)
CALCIUM: 8.6 mg/dL — AB (ref 8.9–10.3)
CHLORIDE: 95 mmol/L — AB (ref 101–111)
CO2: 30 mmol/L (ref 22–32)
CREATININE: 0.42 mg/dL — AB (ref 0.44–1.00)
GLUCOSE: 156 mg/dL — AB (ref 65–99)
Potassium: 4.1 mmol/L (ref 3.5–5.1)
Sodium: 133 mmol/L — ABNORMAL LOW (ref 135–145)

## 2014-11-17 LAB — MAGNESIUM: Magnesium: 2.1 mg/dL (ref 1.7–2.4)

## 2014-11-17 LAB — PHOSPHORUS: Phosphorus: 3.3 mg/dL (ref 2.5–4.6)

## 2014-11-17 MED ORDER — IPRATROPIUM-ALBUTEROL 0.5-2.5 (3) MG/3ML IN SOLN
3.0000 mL | Freq: Four times a day (QID) | RESPIRATORY_TRACT | Status: DC
  Administered 2014-11-17 – 2014-11-19 (×7): 3 mL via RESPIRATORY_TRACT
  Filled 2014-11-17 (×7): qty 3

## 2014-11-17 NOTE — Evaluation (Signed)
Physical Therapy Evaluation Patient Details Name: Miranda Hines MRN: [id number] DOB: Oct 31, 1940 Today's Date: 11/17/2014   History of Present Illness  This 74 y.o. Female admitted with progressive cough, weakness, chills and Rt sided chest pain as well as LE edema. Dx: acute hypoxemic respiratory failure on chronic respiratory failure due to interstitial lung disease and idiopathic pulmonary fibrosis  Clinical Impression  Pt admitted with above diagnosis. Pt currently with functional limitations due to the deficits listed below (see PT Problem List). Miranda Hines has generalized weakness while ambulated related to her low oxygen levels and will benefit greatly from Pulmonary Rehab at d/c.  She is at supervision level of mobility w/ use of RW and will have 24/7 assist available at d/c.  Pt will benefit from skilled PT to increase their independence and safety with mobility to allow discharge to the venue listed below.      Follow Up Recommendations No PT follow up    Equipment Recommendations  None recommended by PT    Recommendations for Other Services Other (comment) (Pulmonary rehab)     Precautions / Restrictions Precautions Precautions: Fall Restrictions Weight Bearing Restrictions: No      Mobility  Bed Mobility Overal bed mobility: Independent                Transfers Overall transfer level: Needs assistance Equipment used: Rolling walker (2 wheeled) Transfers: Sit to/from Stand Sit to Stand: Supervision         General transfer comment: Supervision for safety.  Pt w/ safe technique using RW during transfer.  Ambulation/Gait Ambulation/Gait assistance: Min guard;Supervision Ambulation Distance (Feet): 100 Feet Assistive device: Rolling walker (2 wheeled);None Gait Pattern/deviations: Step-through pattern;Staggering left;Staggering right;Antalgic;Decreased stride length   Gait velocity interpretation: Below normal speed for age/gender General  Gait Details: Pt used RW for 90 ft w/ increased stability.  W/o AD pt slightly staggering Lt and Rt and requires min guard assist.  1 standing rest break as SpO2 drops down to 71% on 6 LPM and has to be bumped up to 8 LPM w/ proper breathing technique for SpO2 to return to low 90s.    Stairs            Wheelchair Mobility    Modified Rankin (Stroke Patients Only)       Balance Overall balance assessment: Needs assistance Sitting-balance support: No upper extremity supported;Feet supported Sitting balance-Leahy Scale: Good     Standing balance support: Bilateral upper extremity supported;During functional activity Standing balance-Leahy Scale: Poor Standing balance comment: Relies on RW for support                             Pertinent Vitals/Pain Pain Assessment: Faces Faces Pain Scale: Hurts little more Pain Location: Lt LE  Pain Descriptors / Indicators: Tender Pain Intervention(s): Limited activity within patient's tolerance;Monitored during session    Home Living Family/patient expects to be discharged to:: Private residence Living Arrangements: Alone Available Help at Discharge: Family;Available 24 hours/day (Family will be alternating to provide 24 hour care) Type of Home: House Home Access: Stairs to enter Entrance Stairs-Rails: None Entrance Stairs-Number of Steps: 1 Home Layout: One level Home Equipment: Walker - 2 wheels      Prior Function Level of Independence: Independent (used 3.5L 02.  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 and who recently fell and is now in SNF  Hand Dominance   Dominant Hand: Right    Extremity/Trunk Assessment   Upper Extremity Assessment: Defer to OT evaluation           Lower Extremity Assessment: Generalized weakness;RLE deficits/detail;LLE deficits/detail RLE Deficits / Details: edema/swelling Bil LEs (Lt>Rt) LLE Deficits / Details: edema/swelling Bil LEs (Lt>Rt)      Communication   Communication: No difficulties  Cognition Arousal/Alertness: Awake/alert Behavior During Therapy: WFL for tasks assessed/performed Overall Cognitive Status: Within Functional Limits for tasks assessed                      General Comments General comments (skin integrity, edema, etc.): Pt reports she has been referred to Pulmonary rehab but has not had the opportunity to go yet 2/2 medical complications.  Pt will benefit greatly from pulmonary rehab to address O2 drop w/ activity.    Exercises General Exercises - Lower Extremity Ankle Circles/Pumps: AROM;Both;15 reps;Supine Long Arc Quad: AROM;Both;10 reps;Seated Hip Flexion/Marching: AROM;Both;10 reps;Seated Other Exercises Other Exercises: Encouraged pt to continue performing exercises listed above throughout day, especially ankle pumps to improve Bil LE swelling      Assessment/Plan    PT Assessment Patient needs continued PT services  PT Diagnosis Difficulty walking;Generalized weakness;Acute pain   PT Problem List Decreased strength;Decreased activity tolerance;Decreased balance;Decreased mobility;Decreased knowledge of use of DME;Decreased knowledge of precautions;Decreased safety awareness;Impaired sensation;Cardiopulmonary status limiting activity;Obesity;Pain  PT Treatment Interventions DME instruction;Gait training;Functional mobility training;Therapeutic activities;Therapeutic exercise;Stair training;Neuromuscular re-education;Balance training;Patient/family education   PT Goals (Current goals can be found in the Care Plan section) Acute Rehab PT Goals Patient Stated Goal: to get stronger  PT Goal Formulation: With patient Time For Goal Achievement: 12/01/14 Potential to Achieve Goals: Good    Frequency Min 2X/week   Barriers to discharge        Co-evaluation               End of Session Equipment Utilized During Treatment: Gait belt;Oxygen Activity Tolerance: Other  (comment);Patient limited by fatigue (limited by SpO2 levels) Patient left: with call bell/phone within reach;in bed Nurse Communication: Mobility status;Other (comment);Precautions (SpO2 levels)         Time: [id number] PT Time Calculation (min) (ACUTE ONLY): 25 min   Charges:   PT Evaluation $Initial PT Evaluation Tier I: 1 Procedure PT Treatments $Gait Training: 8-22 mins   PT G CodesMichail Jewels:       Charnele Semple Parr PT, DPT [phone] Pager: [phone] 11/17/2014, 11:13 AM

## 2014-11-17 NOTE — Consult Note (Signed)
Consultation Note Date: 11/17/2014   Patient Name: Miranda Hines  DOB: Oct 07, 1940  MRN: [id number]  Age / Sex: 74 y.o., female  PCP: Cain Saupe, MD Referring Physician: Lupita Leash, MD  Reason for Consultation: Establishing goals of care    Clinical Assessment/Narrative: Pt is a 74 yo female with dx of ILD approx 2.5 yrs ago. 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 She was adm to hospital 11/1 with increased SHOB, weakness. It was originally thought to perhaps be HCAP, but now thinking more that this is ILD flare. She has been progressively weaker, with marked exercise intolerance. She also is having increased LE edema, cough.  She reports feeling better today, less dyspnea. She is a good historian and seems to have a good grasp of her medical condition. She shares with me that a lung transplant was discussed with her and her daughter. She states" I don't think I would do that". Pt is at the upper age  Limit for transplant. Additionally she wold have to lose weight and pursue aggressive diet. 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  Contacts/Participants in Discussion:Pt Primary Decision Maker: Pt   Relationship to Patient n/a HCPOA: no  Pt is married with 4 children. Her son Caryn Bee lives with her and her husband  SUMMARY OF RECOMMENDATIONS Initial visit to establish rapport with pt and provide additional resources as well as source of support going forward Pt's initial thought is she would NOT pursue a lung transplant. She relays the experince of seeing her grandmother have a protractd death with assoiated suffering and would not want that for her family or herself I recommended that she take this time to gather her thoguths as to what would be in improtant to her going forward, and have a discussion in terms of  goals of care with her family while she is doing well. Given MOST form and booklet " Hard Choices for Loving People".  Code Status/Advance Care Planning: Full code    Code Status Orders        Start     Ordered   11/13/14 1750  Full code   Continuous     11/13/14 1749      Symptom Management:   Pain: pt has chronic back pain. Cont prn ultram  Dyspnea: Cont targeted pulm treatments, 02. At some point pt would benenfit from opioids to manage dyspnea but this is premature at this point  Palliative Prophylaxis:   Bowel Regimen  Additional Recommendations (Limitations, Scope, Preferences):  Full Scope Treatment. Pt just beginning discussions with family re: code status, hospice, treatment options, bx  Psycho-social/Spiritual:  Support System: Strong Desire for further Chaplaincy support:no Additional Recommendations: n/a  Prognosis: Unable to determine  Discharge Planning: TBD, likely home but not clear as to how much support she will need   Chief Complaint/ Primary Diagnoses: Present on Admission:  . Acute respiratory failure with hypoxemia (HCC)  I have reviewed the medical record, interviewed the patient and family, and examined the patient. The following aspects are pertinent.  Past Medical History  Diagnosis Date  . Interstitial lung disease (HCC)   . IPF (idiopathic pulmonary fibrosis) (HCC)    Social History   Social History  . Marital Status: Married    Spouse Name: N/A  . Number of Children: N/A  . Years of Education: N/A   Social History Main  Topics  . Smoking status: Passive Smoke Exposure - Never Smoker  . Smokeless tobacco: None  . Alcohol Use: No  . Drug Use: No  . Sexual Activity: Not Asked   Other Topics Concern  . None   Social History Narrative   Family History  Problem Relation Age of Onset  . Kidney failure Mother   . COPD Mother   . COPD Brother    Scheduled Meds: . aspirin  81 mg Oral Daily  . famotidine  20 mg Oral QHS    . fluticasone  2 spray Each Nare Daily  . furosemide  40 mg Oral Daily  . heparin  5,000 Units Subcutaneous 3 times per day  . ipratropium-albuterol  3 mL Nebulization Q4H  . methylPREDNISolone (SOLU-MEDROL) injection  60 mg Intravenous Q6H  . pantoprazole  40 mg Oral Daily  . potassium chloride  10 mEq Oral Daily  . sildenafil  20 mg Oral TID   Continuous Infusions:  PRN Meds:.sodium chloride, acetaminophen, guaiFENesin, ondansetron (ZOFRAN) IV, traMADol Medications Prior to Admission:  Prior to Admission medications   Medication Sig Start Date End Date Taking? Authorizing Provider  aspirin 81 MG tablet Take 81 mg by mouth daily.   Yes Historical Provider, MD  famotidine (PEPCID) 20 MG tablet Take 1 tablet (20 mg total) by mouth at bedtime. 06/04/14  Yes Nyoka Cowden, MD  fluticasone (FLONASE) 50 MCG/ACT nasal spray Place 2 sprays into both nostrils daily. 10/23/14  Yes Maryann Mikhail, DO  furosemide (LASIX) 40 MG tablet Take 1 tablet (40 mg total) by mouth daily. 10/23/14  Yes Maryann Mikhail, DO  guaiFENesin (MUCINEX) 600 MG 12 hr tablet Take 1,200 mg by mouth as needed for congestion.   Yes Historical Provider, MD  pantoprazole (PROTONIX) 40 MG tablet TAKE 1 TABLET BY MOUTH 30 TO 60 MINUTES BEFORE FIRST MEAL OF THE DAY 10/10/13  Yes Nyoka Cowden, MD  potassium chloride (K-DUR) 10 MEQ tablet Take 1 tablet (10 mEq total) by mouth daily. 10/23/14  Yes Maryann Mikhail, DO  Saline (SIMPLY SALINE) 0.9 % AERS Place 1 spray into the nose daily as needed (allergies).    Yes Historical Provider, MD  sildenafil (REVATIO) 20 MG tablet Take 1 tablet (20 mg total) by mouth 3 (three) times daily. 11/13/14  Yes Lupita Leash, MD   Allergies  Allergen Reactions  . Penicillins Swelling   CBC:    Component Value Date/Time   WBC 6.9 11/14/2014 0558   HGB 9.2* 11/14/2014 0558   HCT 26.8* 11/14/2014 0558   PLT 186 11/14/2014 0558   MCV 98.2 11/14/2014 0558   NEUTROABS 5.9 11/13/2014 1919    LYMPHSABS 1.5 11/13/2014 1919   MONOABS 0.5 11/13/2014 1919   EOSABS 0.4 11/13/2014 1919   BASOSABS 0.0 11/13/2014 1919   Comprehensive Metabolic Panel:    Component Value Date/Time   NA 133* 11/17/2014 0326   K 4.1 11/17/2014 0326   CL 95* 11/17/2014 0326   CO2 30 11/17/2014 0326   BUN 14 11/17/2014 0326   CREATININE 0.42* 11/17/2014 0326   GLUCOSE 156* 11/17/2014 0326   CALCIUM 8.6* 11/17/2014 0326   AST 28 11/13/2014 1919   ALT 18 11/13/2014 1919   ALKPHOS 71 11/13/2014 1919   BILITOT 1.2 11/13/2014 1919   PROT 6.2* 11/13/2014 1919   ALBUMIN 2.9* 11/13/2014 1919    Review of Systems  Constitutional: Positive for activity change.  HENT: Negative.   Eyes: Negative.   Respiratory: Positive for shortness  of breath.   Cardiovascular: Positive for leg swelling.  Endocrine: Negative.   Musculoskeletal: Negative.   Allergic/Immunologic: Negative.   Neurological: Negative.   Hematological: Negative.   Psychiatric/Behavioral: Negative.     Physical Exam  Constitutional: She is oriented to person, place, and time. She appears well-nourished.  HENT:  Head: Normocephalic.  Eyes: EOM are normal. Pupils are equal, round, and reactive to light.  Neck: Normal range of motion.  Respiratory:  Increased effort especially with walking. No over dyspnea with conversation  GI: Soft.  Musculoskeletal: Normal range of motion. She exhibits edema.  Neurological: She is alert and oriented to person, place, and time.  Skin: Skin is warm and dry.  Psychiatric: She has a normal mood and affect.    Vital Signs: BP 98/55 mmHg  Pulse 94  Temp(Src) 98 F (36.7 C) (Oral)  Resp 20  Ht 5\' 1"  (1.549 m)  Wt 100 kg (220 lb 7.4 oz)  BMI 41.68 kg/m2  SpO2 88% SpO2: Last BM Date: 11/16/14  O2 Device:SpO2: (!) 88 % (Patient was talking with NP.) O2 Flow Rate: .O2 Flow Rate (L/min): 5 L/min Intake/output summary:  Intake/Output Summary (Last 24 hours) at 11/17/14 1325 Last data filed at  11/17/14 0730  Gross per 24 hour  Intake    600 ml  Output   1100 ml  Net   -500 ml   LBM:  BMP Latest Ref Rng 11/17/2014 11/16/2014 11/15/2014  Glucose 65 - 99 mg/dL 161(W156(H) 960(A113(H) 540(J104(H)  BUN 6 - 20 mg/dL 14 14 14   Creatinine 0.44 - 1.00 mg/dL 8.11(B0.42(L) 1.470.54 8.290.56  Sodium 135 - 145 mmol/L 133(L) 136 134(L)  Potassium 3.5 - 5.1 mmol/L 4.1 3.5 3.7  Chloride 101 - 111 mmol/L 95(L) 92(L) 93(L)  CO2 22 - 32 mmol/L 30 33(H) 33(H)  Calcium 8.9 - 10.3 mg/dL 5.6(O8.6(L) 1.3(Y8.7(L) 8.3(L)    Baseline Weight: Weight: 98.612 kg (217 lb 6.4 oz) Most recent weight: Weight: 100 kg (220 lb 7.4 oz)      Palliative Assessment/Data:  Flowsheet Rows        Most Recent Value   Intake Tab    Referral Department  Hospitalist   Unit at Time of Referral  Cardiac/Telemetry Unit   Palliative Care Primary Diagnosis  Pulmonary   Date Notified  11/16/14   Palliative Care Type  New Palliative care   Reason for referral  Clarify Goals of Care   Date of Admission  11/13/14   Date first seen by Palliative Care  11/17/14   # of days Palliative referral response time  1 Day(s)   # of days IP prior to Palliative referral  3   Clinical Assessment    Palliative Performance Scale Score  60%   Pain Max last 24 hours  0   Pain Min Last 24 hours  0   Dyspnea Max Last 24 Hours  6   Dyspnea Min Last 24 hours  4   Nausea Max Last 24 Hours  0   Nausea Min Last 24 Hours  0   Anxiety Max Last 24 Hours  0   Anxiety Min Last 24 Hours  0   Psychosocial & Spiritual Assessment    Palliative Care Outcomes    Patient/Family meeting held?  Yes   Palliative Care follow-up planned  Yes, Facility      Additional Data Reviewed: Recent Labs     11/16/14  0435  11/17/14  0326  NA  136  133*  BUN  14  14  CREATININE  0.54  0.42*    Time In: 1230 Time Out: 1345 Time Total: 75 min Greater than 50%  of this time was spent counseling and coordinating care related to the above assessment and plan.  Signed by: Irean Hong,  NP  Irean Hong, NP  11/17/2014, 1:25 PM  Please contact Palliative Medicine Team phone at [phone] for questions and concerns.

## 2014-11-17 NOTE — Progress Notes (Signed)
PULMONARY / CRITICAL CARE MEDICINE   Name: Miranda Hines MRN: [id number] DOB: 10/04/1940    ADMISSION DATE:  11/13/2014 CONSULTATION DATE:  11/13/2014  REFERRING MD :  Kendrick FriesMcQuaid  CHIEF COMPLAINT:  Shortness of breath  INITIAL PRESENTATION:  74yo female with hx pulm HTN, ILD directly admitted to hospital from office 11/1 with acute on chronic respiratory failure r/t ILD flare, pulm HTN and pulmonary edema.     STUDIES:  Chest x-ray November 1>> unchanged ILD  11/14/2014 - reports being marginally better. Hx review - on day of right heart cath felt cold which lasted 2-3 days then diaphoresis and mild yellow sputuma nd possible LLE edema for 2 prior to admission. Feelingmore dyspneic but no change in o2 need of 3-4 L. Completed her prednisone given 3 weeks ago. Still on daily lasix CT chest high res 11/3>>> 1. The appearance of the chest is again compatible with interstitial lung disease, and the overall pattern is favored to represent progressively worsening chronic hypersensitivity pneumonitis based on the spectrum of findings and presence of some air trapping on today's examination. 2. Dilatation of the pulmonic trunk again noted, suggestive of pulmonary arterial hypertension. 3. Atherosclerosis, including 2 vessel coronary artery disease. Assessment for potential risk factor modification, dietary therapy or pharmacologic therapy may be warranted, if clinically indicated. 4. Additional incidental findings, as above. BLE venous doppler 11/3>> NEG  SUBJECTIVE/OVERNIGHT/INTERVAL HX    VITAL SIGNS: Temp:  [98 F (36.7 C)] 98 F (36.7 C) (11/05 1353) Pulse Rate:  [65-99] 65 (11/05 1353) Resp:  [16-22] 19 (11/05 1353) BP: (98-120)/(54-56) 120/56 mmHg (11/05 1353) SpO2:  [88 %-97 %] 97 % (11/05 1353) Weight:  [220 lb 7.4 oz (100 kg)] 220 lb 7.4 oz (100 kg) (11/05 0451)    INTAKE / OUTPUT:  Intake/Output Summary (Last 24 hours) at 11/17/14 1526 Last data filed at 11/17/14  1230  Gross per 24 hour  Intake    960 ml  Output   1750 ml  Net   -790 ml    PHYSICAL EXAMINATION: Gen: chronically ill appearing, pleasant, mild respiratory distress, better sitting up HENT: OP clear, neck supple PULM: resps even non labored on 5L [place], mild crackles, +VCD component CV: Tachy, regular, no mgr, notable ankle edema  GI: BS+, soft, nontender Derm: no cyanosis or rash Psyche: Awake and appropriate, MAE LABS:  CBC  Recent Labs Lab 11/13/14 1919 11/14/14 0558  WBC 8.3 6.9  HGB 9.0* 9.2*  HCT 27.9* 26.8*  PLT 239 186   Coag's No results for input(s): APTT, INR in the last 168 hours. BMET  Recent Labs Lab 11/15/14 0332 11/16/14 0435 11/17/14 0326  NA 134* 136 133*  K 3.7 3.5 4.1  CL 93* 92* 95*  CO2 33* 33* 30  BUN 14 14 14   CREATININE 0.56 0.54 0.42*  GLUCOSE 104* 113* 156*   Electrolytes  Recent Labs Lab 11/15/14 0332 11/16/14 0435 11/17/14 0326  CALCIUM 8.3* 8.7* 8.6*  MG 2.1 1.9 2.1  PHOS 3.1 3.2 3.3   Sepsis Markers  Recent Labs Lab 11/13/14 1919 11/14/14 1052 11/16/14 0435  PROCALCITON <0.10 <0.10 <0.10   ABG No results for input(s): PHART, PCO2ART, PO2ART in the last 168 hours. Liver Enzymes  Recent Labs Lab 11/13/14 1919  AST 28  ALT 18  ALKPHOS 71  BILITOT 1.2  ALBUMIN 2.9*   Cardiac Enzymes  Recent Labs Lab 11/13/14 1919 11/13/14 2320 11/14/14 0558  TROPONINI <0.03 <0.03 <0.03   Glucose No results for  input(s): GLUCAP in the last 168 hours.  Imaging No results found.   ASSESSMENT / PLAN:  Acute on chronic respiratory failure r/t ILD flare, pulm HTN, pulmonary edema and Acute bronchitis v HCAP (doubt) Pulmonary HTN  -High res CT chest c/w ILD, no s/s infection  P:   -Supplemental O2 as needed for O2 greater than 90% -Cont PO levaquin - stop date in place  -F/u Chest x-ray -Contniue lasix -Continue IV steroids for now for ILD flare -BD's - duoneb q6 -Mucolytic  -Palliative care following for  symptoms, goals of care -- she will likely not pursue transplant and may want to discuss hospice options - currently discussing with family.   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 Rx with inpatient regimen now -Consider addition Letairis if subj unimproved by 11/19/14 (d/w Dr Kendrick Fries)  Hyponatremia - mild  P:   -Monitor BMET and UOP -Replace electrolytes as needed   FAMILY  - Updates: patient at bedside and family 11/5    Dirk Dress, NP 11/17/2014  3:26 PM Pager: [phone] or [phone]

## 2014-11-18 LAB — BASIC METABOLIC PANEL
Anion gap: 12 (ref 5–15)
BUN: 24 mg/dL — AB (ref 6–20)
CO2: 32 mmol/L (ref 22–32)
Calcium: 9.5 mg/dL (ref 8.9–10.3)
Chloride: 94 mmol/L — ABNORMAL LOW (ref 101–111)
Creatinine, Ser: 0.71 mg/dL (ref 0.44–1.00)
GFR calc Af Amer: >60 mL/min (ref >60)
GLUCOSE: 133 mg/dL — AB (ref 65–99)
Potassium: 4.5 mmol/L (ref 3.5–5.1)
Sodium: 138 mmol/L (ref 135–145)

## 2014-11-18 LAB — CULTURE, BLOOD (ROUTINE X 2)
CULTURE: NO GROWTH
CULTURE: NO GROWTH

## 2014-11-18 LAB — CBC
HEMATOCRIT: 31.3 % — AB (ref 36.0–46.0)
Hemoglobin: 10 g/dL — ABNORMAL LOW (ref 12.0–15.0)
MCH: 31.9 pg (ref 26.0–34.0)
MCHC: 31.9 g/dL (ref 30.0–36.0)
MCV: 100 fL (ref 78.0–100.0)
Platelets: 312 10*3/uL (ref 150–400)
RBC: 3.13 MIL/uL — ABNORMAL LOW (ref 3.87–5.11)
RDW: 15.5 % (ref 11.5–15.5)
WBC: 9.3 10*3/uL (ref 4.0–10.5)

## 2014-11-18 LAB — PHOSPHORUS: Phosphorus: 3.9 mg/dL (ref 2.5–4.6)

## 2014-11-18 LAB — MAGNESIUM: Magnesium: 2.2 mg/dL (ref 1.7–2.4)

## 2014-11-18 LAB — PROCALCITONIN: Procalcitonin: <0.1 ng/mL

## 2014-11-18 MED ORDER — FUROSEMIDE 10 MG/ML IJ SOLN
40.0000 mg | Freq: Once | INTRAMUSCULAR | Status: AC
  Administered 2014-11-18: 40 mg via INTRAVENOUS
  Filled 2014-11-18: qty 4

## 2014-11-18 NOTE — Progress Notes (Signed)
PULMONARY / CRITICAL CARE MEDICINE   Name: Miranda Hines MRN: [id number] DOB: 1940/01/26    ADMISSION DATE:  11/13/2014 CONSULTATION DATE:  11/13/2014  REFERRING MD :  Kendrick Fries  CHIEF COMPLAINT:  Shortness of breath  INITIAL PRESENTATION:  74yo female with hx pulm HTN, ILD directly admitted to hospital from office 11/1 with acute on chronic respiratory failure r/t ILD flare, pulm HTN and pulmonary edema.     STUDIES:  Chest x-ray November 1>> unchanged ILD  11/14/2014 - reports being marginally better. Hx review - on day of right heart cath felt cold which lasted 2-3 days then diaphoresis and mild yellow sputuma nd possible LLE edema for 2 prior to admission. Feelingmore dyspneic but no change in o2 need of 3-4 L. Completed her prednisone given 3 weeks ago. Still on daily lasix CT chest high res 11/3>>> 1. The appearance of the chest is again compatible with interstitial lung disease, and the overall pattern is favored to represent progressively worsening chronic hypersensitivity pneumonitis based on the spectrum of findings and presence of some air trapping on today's examination. 2. Dilatation of the pulmonic trunk again noted, suggestive of pulmonary arterial hypertension. 3. Atherosclerosis, including 2 vessel coronary artery disease. Assessment for potential risk factor modification, dietary therapy or pharmacologic therapy may be warranted, if clinically indicated. 4. Additional incidental findings, as above. BLE venous doppler 11/3>> NEG  SUBJECTIVE/OVERNIGHT/INTERVAL HX Feeling better.  Ambulating in room with mild dyspnea.  Feels worsening BLE edema.     VITAL SIGNS: Temp:  [98 F (36.7 C)-98.4 F (36.9 C)] 98.4 F (36.9 C) (11/06 0537) Pulse Rate:  [65-91] 91 (11/06 0830) Resp:  [18-19] 18 (11/06 0830) BP: (100-120)/(56-70) 100/70 mmHg (11/06 0537) SpO2:  [91 %-97 %] 94 % (11/06 0830) Weight:  [220 lb 7.4 oz (100 kg)] 220 lb 7.4 oz (100 kg) (11/06 0537)     INTAKE / OUTPUT:  Intake/Output Summary (Last 24 hours) at 11/18/14 1340 Last data filed at 11/18/14 0730  Gross per 24 hour  Intake    600 ml  Output   1000 ml  Net   -400 ml    PHYSICAL EXAMINATION: Gen: chronically ill appearing, pleasant, NAD  HENT: OP clear, neck supple PULM: resps even non labored on 5L [place], mild diffuse crackles, +VCD component CV: Tachy, regular, no mgr, notable ankle edema  GI: BS+, soft, nontender Derm: no cyanosis or rash, 2-3+ BLE edema  Psyche: Awake and appropriate, MAE  LABS:  CBC  Recent Labs Lab 11/13/14 1919 11/14/14 0558 11/18/14 0448  WBC 8.3 6.9 9.3  HGB 9.0* 9.2* 10.0*  HCT 27.9* 26.8* 31.3*  PLT 239 186 312   Coag's No results for input(s): APTT, INR in the last 168 hours. BMET  Recent Labs Lab 11/16/14 0435 11/17/14 0326 11/18/14 0448  NA 136 133* 138  K 3.5 4.1 4.5  CL 92* 95* 94*  CO2 33* 30 32  BUN 14 14 24*  CREATININE 0.54 0.42* 0.71  GLUCOSE 113* 156* 133*   Electrolytes  Recent Labs Lab 11/16/14 0435 11/17/14 0326 11/18/14 0448  CALCIUM 8.7* 8.6* 9.5  MG 1.9 2.1 2.2  PHOS 3.2 3.3 3.9   Sepsis Markers  Recent Labs Lab 11/14/14 1052 11/16/14 0435 11/18/14 0448  PROCALCITON <0.10 <0.10 <0.10   ABG No results for input(s): PHART, PCO2ART, PO2ART in the last 168 hours. Liver Enzymes  Recent Labs Lab 11/13/14 1919  AST 28  ALT 18  ALKPHOS 71  BILITOT 1.2  ALBUMIN  2.9*   Cardiac Enzymes  Recent Labs Lab 11/13/14 1919 11/13/14 2320 11/14/14 0558  TROPONINI <0.03 <0.03 <0.03   Glucose No results for input(s): GLUCAP in the last 168 hours.  Imaging No results found.   ASSESSMENT / PLAN:  Acute on chronic respiratory failure r/t ILD flare, pulm HTN, pulmonary edema and Acute bronchitis v HCAP (doubt) Pulmonary HTN  -High res CT chest c/w ILD, no s/s infection  P:   -Supplemental O2 as needed for O2 greater than 90% -abx completed 11/5 -intermittent f/u CXR  -Continue PO  lasix -additional lasix 40mg  IV x 1 11/6 -Continue IV steroids for now for ILD flare -BD's - duoneb q6 -Mucolytic  -Palliative care following for symptoms, goals of care -- she will likely not pursue transplant and may want to discuss hospice options - currently discussing this with family.   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 Rx with inpatient regimen now -Consider addition Letairis if subj unimproved by 11/19/14 (d/w Dr Kendrick FriesMcQuaid)  Hyponatremia - mild  P:   -Monitor BMET and UOP -Replace electrolytes as needed   FAMILY  - Updates: patient 11/6  Continue IV steroids, BD's, PO lasix for now.  Consider addition Letairis in am although pt seems much improved subjectively.  Likely ready for d/c in next 24-48 hours.    Dirk DressKaty Whiteheart, NP 11/18/2014  1:40 PM Pager: [phone] or [phone]

## 2014-11-19 ENCOUNTER — Inpatient Hospital Stay (HOSPITAL_COMMUNITY): Payer: Medicare Other

## 2014-11-19 DIAGNOSIS — Z515 Encounter for palliative care: Secondary | ICD-10-CM

## 2014-11-19 DIAGNOSIS — I272 Other secondary pulmonary hypertension: Secondary | ICD-10-CM

## 2014-11-19 DIAGNOSIS — J9621 Acute and chronic respiratory failure with hypoxia: Secondary | ICD-10-CM

## 2014-11-19 LAB — BASIC METABOLIC PANEL
Anion gap: 11 (ref 5–15)
BUN: 30 mg/dL — ABNORMAL HIGH (ref 6–20)
CALCIUM: 9 mg/dL (ref 8.9–10.3)
CO2: 33 mmol/L — AB (ref 22–32)
CREATININE: 0.67 mg/dL (ref 0.44–1.00)
Chloride: 94 mmol/L — ABNORMAL LOW (ref 101–111)
GFR calc non Af Amer: >60 mL/min (ref >60)
Glucose, Bld: 142 mg/dL — ABNORMAL HIGH (ref 65–99)
Potassium: 4.1 mmol/L (ref 3.5–5.1)
SODIUM: 138 mmol/L (ref 135–145)

## 2014-11-19 MED ORDER — PREDNISONE 20 MG PO TABS
60.0000 mg | ORAL_TABLET | Freq: Every day | ORAL | Status: DC
  Administered 2014-11-20 – 2014-11-21 (×2): 60 mg via ORAL
  Filled 2014-11-19 (×2): qty 3

## 2014-11-19 MED ORDER — IPRATROPIUM-ALBUTEROL 0.5-2.5 (3) MG/3ML IN SOLN
3.0000 mL | Freq: Three times a day (TID) | RESPIRATORY_TRACT | Status: DC
  Administered 2014-11-19 – 2014-11-21 (×5): 3 mL via RESPIRATORY_TRACT
  Filled 2014-11-19 (×5): qty 3

## 2014-11-19 NOTE — Progress Notes (Signed)
Occupational Therapy Treatment Patient Details Name: Miranda Hines MRN: [id number] DOB: 1940-07-22 Today's Date: 11/19/2014    History of present illness This 74 y.o. Female admitted with progressive cough, weakness, chills and Rt sided chest pain as well as LE edema. Dx: acute hypoxemic respiratory failure on chronic respiratory failure due to interstitial lung disease and idiopathic pulmonary fibrosis   OT comments  Pt educated in energy conservation at length with handout to reinforce education.  Pt is currently performing toileting and self care with set up and taking herself to the restroom. She reports marked gains in breathing and endurance since admission and is currently on 2.5 L 02.    Follow Up Recommendations  Supervision/Assistance - 24 hour (Pt has declined HHOT.)    Equipment Recommendations  None recommended by OT    Recommendations for Other Services      Precautions / Restrictions Precautions Precaution Comments: watch sats       Mobility Bed Mobility               General bed mobility comments: sitting EOB   Transfers Overall transfer level: Modified independent Equipment used: None Transfers: Sit to/from Stand Sit to Stand: Modified independent (Device/Increase time)         General transfer comment: pt moving about her room at a modified independent level    Balance     Sitting balance-Leahy Scale: Good       Standing balance-Leahy Scale: Good                     ADL Overall ADL's : Needs assistance/impaired     Grooming: Wash/dry hands;Standing;Modified independent Grooming Details (indicate cue type and reason): instructed to sit when grooming for prolonged period as in when styling her hair Upper Body Bathing: Set up;Sitting   Lower Body Bathing: Set up;Sit to/from stand   Upper Body Dressing : Set up;Sitting   Lower Body Dressing: Set up;Sit to/from stand   Toilet Transfer: Museum/gallery curator;Modified  Independent   Toileting- Clothing Manipulation and Hygiene: Modified independent;Sit to/from stand       Functional mobility during ADLs: Modified independent General ADL Comments: Educated pt and provided EC handout.  Recommended pt consider rollator for ambulation for energy conservation and to carry her 02 tank.  Recommended pt consider a transport chair for her grandson's wedding this weekend. Pt has been routinely taking herself to the restroom.      Vision                     Perception     Praxis      Cognition   Behavior During Therapy: WFL for tasks assessed/performed Overall Cognitive Status: Within Functional Limits for tasks assessed                       Extremity/Trunk Assessment               Exercises     Shoulder Instructions       General Comments      Pertinent Vitals/ Pain       Pain Assessment: No/denies pain  Home Living                                          Prior Functioning/Environment  Frequency Min 2X/week     Progress Toward Goals  OT Goals(current goals can now be found in the care plan section)  Progress towards OT goals: Progressing toward goals  Acute Rehab OT Goals Patient Stated Goal: home tomorrow  Plan Discharge plan needs to be updated    Co-evaluation                 End of Session Equipment Utilized During Treatment: Oxygen (2.5L)   Activity Tolerance Patient tolerated treatment well   Patient Left in bed;with call bell/phone within reach;with nursing/sitter in room (RT)   Nurse Communication          Time: 2841-3244: 1509-1535 OT Time Calculation (min): 26 min  Charges: OT General Charges $OT Visit: 1 Procedure OT Treatments $Self Care/Home Management : 23-37 mins  Miranda Hines, Miranda Hines 11/19/2014, 3:52 PM [phone]

## 2014-11-19 NOTE — Care Management Note (Signed)
Case Management Note  Patient Details  Name: Miranda Hines MRN: [id number] Date of Birth: 06/26/1940  Subjective/Objective:   Pt admitted with acute respiratory failure                 Action/Plan:  Pt states she is independent from home with home O2.  Pt lives with both adult son and husband (husband was placed in SNF for rehab post a fall on 10/20/14). Pt has been started on Revatio, CM will submit benefits check.  MD has already initiated the prior authorization process.  CM will continue to monitor for disposition needs.   Expected Discharge Date:                  Expected Discharge Plan:  Home/Self Care  In-House Referral:     Discharge planning Services  CM Consult  Post Acute Care Choice:    Choice offered to:     DME Arranged:    DME Agency:     HH Arranged:    HH Agency:     Status of Service:  In process, will continue to follow  Medicare Important Message Given:  Yes-second notification given Date Medicare IM Given:    Medicare IM give by:    Date Additional Medicare IM Given:    Additional Medicare Important Message give by:     If discussed at Long Length of Stay Meetings, dates discussed:    Additional Comments: 11/19/2014  CM assessed pt; pt no longer SOB with conversation.  Pt refused recommended OT, CM asked pt to contact PCP if she felt she could benefit from [facility] in the future.  Daughter at bedside.  Per pt and daughter; pts son Caryn Bee will provide 24 hour supervision upon discharge.  11/16/14 MD acknowledged CM concern and agreed to PT/OT evaluation, evaluation ordered.  CM will continue to monitor for disposition needs.  CM re-assessed pt with daughter at bedside.  Pt plans to return home with son, son can provide 24 hour supervision however pt is not able to perform ADLS at this time.  Per daughter; pts husband will remain at [facility] SNF to give pt time to recoup prior to pt having to resuming care for spouse.  Pt remains out of breath from  conversation.  CM spoke in detail with both pt and daughter regarding concerns with pt returning home with current oxygenation issue and inability to perform ADLs.  Pt and daughter are open to the idea of possible SNF associated rehab.  CM consulted with CSW and will communicate concern to MD and request PT/OT eval.  CM will continue to monitor for disposition needs.  10/15/14 CM assessed pt.  Benefit check submitted; per pt she can afford to pay the $45 copay.  Pt stated her preferred pharmacy is [facility] on [address], CM contacted pharmacy and was informed that generic form of medication is available for pickup.  CM provided both CM note and Physician Sticky Note (summary tab) informing MD of benefit check results:  Per pt copay for generic will be 29% co insurance- generic copay will be $45 -prior auth required [id number]  Cherylann Parr, RN 11/19/2014, 2:45 PM Case Management Note  Patient Details  Name: Miranda Hines MRN: [id number] Date of Birth: [birth year]  Subjective/Objective:                    Action/Plan:   Expected Discharge Date:  Expected Discharge Plan:  Home/Self Care  In-House Referral:     Discharge planning Services  CM Consult  Post Acute Care Choice:    Choice offered to:     DME Arranged:    DME Agency:     HH Arranged:    HH Agency:     Status of Service:  In process, will continue to follow  Medicare Important Message Given:  Yes-second notification given Date Medicare IM Given:    Medicare IM give by:    Date Additional Medicare IM Given:    Additional Medicare Important Message give by:     If discussed at Long Length of Stay Meetings, dates discussed:    Additional Comments:  Cherylann ParrClaxton, Paris Hohn S, RN 11/19/2014, 2:45 PM

## 2014-11-19 NOTE — Progress Notes (Signed)
Daily Progress Note   Patient Name: Miranda Hines       Date: 11/19/2014 DOB: Oct 22, 1940  Age: 74 y.o. MRN#: [id number] Attending Physician: Juanito Doom, MD Primary Care Physician: Antony Blackbird, MD Admit Date: 11/13/2014  Reason for Consultation/Follow-up: Establishing goals of care  Subjective: I met today with Ms. Ahlquist. She is sitting on the edge of the bed and talks in conversation with ease. She says she is feeling much better except still having a terrible time with any activity - even going into the bathroom. She says she is feeling optimistic about the plan from Dr. Chase Caller with a new medication. She understands trajectory of her disease and says that she understands how hospice could be helpful to her in the future but not ready for this now. She has a lot of stress although her son lives with her (having sciatic back problems). Her husband is in SNF rehab after hip fracture and is struggling with colon cancer (she is his main caregiver). She has a very optimistic point of view and is, although frustrated, fairly accepting of where she is. Our conversation was cut short but phone call from her family.    Length of Stay: 6 days  Current Medications: Scheduled Meds:  . aspirin  81 mg Oral Daily  . famotidine  20 mg Oral QHS  . fluticasone  2 spray Each Nare Daily  . furosemide  40 mg Oral Daily  . heparin  5,000 Units Subcutaneous 3 times per day  . ipratropium-albuterol  3 mL Nebulization Q6H  . methylPREDNISolone (SOLU-MEDROL) injection  60 mg Intravenous Q6H  . pantoprazole  40 mg Oral Daily  . potassium chloride  10 mEq Oral Daily  . sildenafil  20 mg Oral TID    Continuous Infusions:    PRN Meds: sodium chloride, acetaminophen, guaiFENesin,  ondansetron (ZOFRAN) IV, traMADol  Physical Exam: Physical Exam  Constitutional: She is oriented to person, place, and time. She appears well-developed and well-nourished.  HENT:  Head: Normocephalic and atraumatic.  Cardiovascular: Regular rhythm.   Pulmonary/Chest: Effort normal. No respiratory distress.  Abdominal: She exhibits no distension.  Neurological: She is alert and oriented to person, place, and time.  Psychiatric: She has a normal mood and affect.  Vital Signs: BP 112/41 mmHg  Pulse 74  Temp(Src) 97.6 F (36.4 C) (Oral)  Resp 18  Ht _0  (1.549 m)  Wt 99.791 kg (220 lb)  BMI 41.59 kg/m2  SpO2 93% SpO2: SpO2: 93 % O2 Device: O2 Device: Nasal Cannula O2 Flow Rate: O2 Flow Rate (L/min): 2.5 L/min  Intake/output summary:  Intake/Output Summary (Last 24 hours) at 11/19/14 1159 Last data filed at 11/19/14 0830  Gross per 24 hour  Intake    720 ml  Output   1351 ml  Net   -631 ml   LBM:   Baseline Weight: Weight: 98.612 kg (217 lb 6.4 oz) Most recent weight: Weight: 99.791 kg (220 lb)       Palliative Assessment/Data: Flowsheet Rows        Most Recent Value   Intake Tab    Referral Department  Hospitalist   Unit at Time of Referral  Cardiac/Telemetry Unit   Palliative Care Primary Diagnosis  Pulmonary   Date Notified  11/16/14   Palliative Care Type  New Palliative care   Reason for referral  Clarify Goals of Care   Date of Admission  11/13/14   Date first seen by Palliative Care  11/17/14   # of days Palliative referral response time  1 Day(s)   # of days IP prior to Palliative referral  3   Clinical Assessment    Palliative Performance Scale Score  60%   Pain Max last 24 hours  0   Pain Min Last 24 hours  0   Dyspnea Max Last 24 Hours  6   Dyspnea Min Last 24 hours  4   Nausea Max Last 24 Hours  0   Nausea Min Last 24 Hours  0   Anxiety Max Last 24 Hours  0   Anxiety Min Last 24 Hours  0   Psychosocial & Spiritual Assessment     Palliative Care Outcomes    Patient/Family meeting held?  Yes   Palliative Care follow-up planned  Yes, Facility      Additional Data Reviewed: Recent Labs     11/18/14  0448  11/19/14  0410  WBC  9.3   --   HGB  10.0*   --   PLT  312   --   NA  138  138  BUN  24*  30*  CREATININE  0.71  0.67     Problem List:  Patient Active Problem List   Diagnosis Date Noted  . Palliative care encounter   . Acute respiratory failure ([place])   . ILD (interstitial lung disease) ([place])   . Acute respiratory failure with hypoxemia ([place]) 11/13/2014  . Pulmonary hypertension ([place]) 10/30/2014  . Acute on chronic respiratory failure with hypoxia ([place]) 10/22/2014  . Chronic sinus complaints   . Hypoxia   . Rash and nonspecific skin eruption 03/30/2014  . Dyspnea 03/30/2014  . Cellulitis 03/03/2014  . Chronic respiratory failure with hypercapnia ([place]) 10/06/2013  . Heart palpitations 05/19/2013  . Elevated brain natriuretic peptide (BNP) level 05/19/2013  . Interstitial pulmonary fibrosis ([place]) 11/14/2012     Palliative Care Assessment & Plan    1.Code Status:  Full code - did not address today    Code Status Orders        Start     Ordered   11/13/14 1750  Full code   Continuous     11/13/14 1749       2. Goals of Care:  Optimize health for optimized QOL.   Desire for further Chaplaincy support:no  Psycho-social Needs: Caregiving  Support/Resources  3. Symptom Management:      1. Dyspnea: Optimize per pulmonary team.      2. Back pain: She is happy and relieved with prn tramadol.   4. Palliative Prophylaxis:   Bowel Regimen  5. Prognosis: Unable to determine  6. Discharge Planning:  Home with Home Health   Thank you for allowing the Palliative Medicine Team to assist in the care of this patient.   Time In: 0940 Time Out: 1020 Total Time 52mn Prolonged Time Billed  no         APershing Proud NP  111/05/5206 11:59 AM  Please contact Palliative  Medicine Team phone at [phone] questions and concerns.

## 2014-11-19 NOTE — Progress Notes (Addendum)
PULMONARY / CRITICAL CARE MEDICINE   Name: Miranda Hines MRN: [id number] DOB: [birth year]    ADMISSION DATE:  11/13/2014 CONSULTATION DATE:  11/13/2014  REFERRING MD :  Kendrick FriesMcQuaid  CHIEF COMPLAINT:  Shortness of breath  INITIAL PRESENTATION:  74yo female with hx pulm HTN, ILD directly admitted to hospital from office 11/1 with acute on chronic respiratory failure r/t ILD flare, pulm HTN and pulmonary edema.     STUDIES:  Chest x-ray November 1>> unchanged ILD  11/14/2014 - reports being marginally better. Hx review - on day of right heart cath felt cold which lasted 2-3 days then diaphoresis and mild yellow sputuma nd possible LLE edema for 2 prior to admission. Feelingmore dyspneic but no change in o2 need of 3-4 L. Completed her prednisone given 3 weeks ago. Still on daily lasix CT chest high res 11/3>>> 1. The appearance of the chest is again compatible with interstitial lung disease, and the overall pattern is favored to represent progressively worsening chronic hypersensitivity pneumonitis based on the spectrum of findings and presence of some air trapping on today's examination. 2. Dilatation of the pulmonic trunk again noted, suggestive of pulmonary arterial hypertension. 3. Atherosclerosis, including 2 vessel coronary artery disease. Assessment for potential risk factor modification, dietary therapy or pharmacologic therapy may be warranted, if clinically indicated. 4. Additional incidental findings, as above. BLE venous doppler 11/3>> NEG Port CXR 11/7>> questionable worsening of bilateral interstitial prominence. No focal opacity appreciated.  SUBJECTIVE: Continues to have intermittent coughing productive of a cloudy phlegm. Denies any subjective fever, chills, or sweats. Dyspnea is markedly improving with ambulation in the room but has not yet ambulated in the hallway. Continued to report significant lower extremity edema & pain.  REVIEW OF SYSTEMS: No chest pain or  pressure. No nausea or vomiting.  VITAL SIGNS: Temp:  [97.5 F (36.4 C)-98.2 F (36.8 C)] 97.5 F (36.4 C) (11/07 1402) Pulse Rate:  [72-88] 88 (11/07 1402) Resp:  [18-19] 19 (11/07 1402) BP: (104-112)/(41-60) 110/52 mmHg (11/07 1402) SpO2:  [90 %-98 %] 93 % (11/07 1402) Weight:  [99.791 kg (220 lb)] 99.791 kg (220 lb) (11/07 0414)    INTAKE / OUTPUT:  Intake/Output Summary (Last 24 hours) at 11/19/14 1539 Last data filed at 11/19/14 1405  Gross per 24 hour  Intake    720 ml  Output   1251 ml  Net   -531 ml    PHYSICAL EXAMINATION: General:  Awake. Alert. No acute distress. Sitting up in bed with respiratory therapy & nurse at bedside. Obese..  Integument:  Warm & dry. No rash on exposed skin.  HEENT:  Moist mucus membranes. No oral ulcers. No scleral injection or icterus.  Cardiovascular:  Regular rate. Pitting bilateral lower extremity edema.  Pulmonary:  Mild crackles bilaterally. Normal work of breathing on 2.5 L nasal cannula oxygen. Speaking in complete sentences. Abdomen: Soft. Normal bowel sounds. Protuberant. Musculoskeletal:  Normal bulk and tone. No joint deformity or effusion appreciated.  LABS:  CBC  Recent Labs Lab 11/13/14 1919 11/14/14 0558 11/18/14 0448  WBC 8.3 6.9 9.3  HGB 9.0* 9.2* 10.0*  HCT 27.9* 26.8* 31.3*  PLT 239 186 312   Coag's No results for input(s): APTT, INR in the last 168 hours. BMET  Recent Labs Lab 11/17/14 0326 11/18/14 0448 11/19/14 0410  NA 133* 138 138  K 4.1 4.5 4.1  CL 95* 94* 94*  CO2 30 32 33*  BUN 14 24* 30*  CREATININE 0.42* 0.71 0.67  GLUCOSE  156* 133* 142*   Electrolytes  Recent Labs Lab 11/16/14 0435 11/17/14 0326 11/18/14 0448 11/19/14 0410  CALCIUM 8.7* 8.6* 9.5 9.0  MG 1.9 2.1 2.2  --   PHOS 3.2 3.3 3.9  --    Sepsis Markers  Recent Labs Lab 11/14/14 1052 11/16/14 0435 11/18/14 0448  PROCALCITON <0.10 <0.10 <0.10   ABG No results for input(s): PHART, PCO2ART, PO2ART in the last  168 hours. Liver Enzymes  Recent Labs Lab 11/13/14 1919  AST 28  ALT 18  ALKPHOS 71  BILITOT 1.2  ALBUMIN 2.9*   Cardiac Enzymes  Recent Labs Lab 11/13/14 1919 11/13/14 2320 11/14/14 0558  TROPONINI <0.03 <0.03 <0.03   Glucose No results for input(s): GLUCAP in the last 168 hours.  Imaging Dg Chest Portable 1 View  11/19/2014  CLINICAL DATA:  Onset of productive cough recently, history of respiratory failure, idiopathic pulmonary fibrosis, pulmonary hypertension. EXAM: PORTABLE CHEST 1 VIEW COMPARISON:  Portable chest x-ray of November 23, 2014 FINDINGS: The lung volumes remain low. The pulmonary interstitial markings have increased. The central pulmonary vascularity remains engorged. The cardiac silhouette remains enlarged with the margins indistinct. IMPRESSION: Worsening of pulmonary interstitial densities bilaterally most compatible with acute pulmonary edema superimposed upon known underlying interstitial lung disease. No alveolar infiltrate is observed. When the patient can tolerate the procedure, a PA and lateral chest x-ray would be useful. Electronically Signed   By: David  [place] M.D.   On: 11/19/2014 07:36     ASSESSMENT / PLAN: 74 year old Caucasian female with underlying interstitial lung disease and pulmonary hypertension. Patient is clinically improving with treatment including IV steroids as well as additional diuresis with Lasix. She continues to have no symptoms that would suggest underlying infection. Additionally with her normal Procalcitonin and absence of leukocytosis I am very skeptical about the possibility of underlying infection. She does feel a symptomatic benefit from her nebulizer therapy. Given her continued clinical progress I do not feel that Kathalene Frames is indicated at this time.  1. Acute on chronic hypoxic respiratory failure: Likely secondary to flare of interstitial lung disease. Oxygen requirement continuing to improve. Plan to ambulate the patient  in the hallway to determine oxygen requirement to maintain her saturation greater than 92%. 2. Pulmonary hypertension: Continuing patient on Lasix 40 mg by mouth daily &  Revatio 20 mg by mouth 3 times a day. Holding on further additional Lasix given mild elevation in BUN & Creatinine. Monitoring renal function with electrolytes & BUN/creatinine daily. 3. ILD flare: Switching Solu-Medrol to prednisone 60 mg by mouth daily. 4. Possible acute bronchitis: Patient completed course of antibiotics. Switching Duonebs to tid. Continuing mucolytic. 5. Hyponatremia: Resolved. 6. Prophylaxis: Pepcid 20 mg by mouth daily at bedtime & Protonix by mouth daily. Heparin subcutaneous every 8 hours.  7. Disposition: Plan ambulance patient hallway prior to discharge. Continue to monitor the patient on oral steroids for 24-48 hours prior to discharge home.  Donna Christen Jamison Neighbor, M.D. [facility] Pulmonary & Critical Care Pager:  [phone] After 3pm or if no response, call [phone] 11/19/2014  3:39 PM

## 2014-11-20 ENCOUNTER — Telehealth: Payer: Self-pay | Admitting: *Deleted

## 2014-11-20 ENCOUNTER — Encounter (HOSPITAL_COMMUNITY): Payer: Medicare Other

## 2014-11-20 DIAGNOSIS — D6489 Other specified anemias: Secondary | ICD-10-CM

## 2014-11-20 LAB — RENAL FUNCTION PANEL
ALBUMIN: 2.7 g/dL — AB (ref 3.5–5.0)
Anion gap: 10 (ref 5–15)
BUN: 28 mg/dL — ABNORMAL HIGH (ref 6–20)
CHLORIDE: 94 mmol/L — AB (ref 101–111)
CO2: 33 mmol/L — ABNORMAL HIGH (ref 22–32)
Calcium: 9 mg/dL (ref 8.9–10.3)
Creatinine, Ser: 0.58 mg/dL (ref 0.44–1.00)
Glucose, Bld: 142 mg/dL — ABNORMAL HIGH (ref 65–99)
POTASSIUM: 4 mmol/L (ref 3.5–5.1)
Phosphorus: 4.6 mg/dL (ref 2.5–4.6)
Sodium: 137 mmol/L (ref 135–145)

## 2014-11-20 LAB — CBC WITH DIFFERENTIAL/PLATELET
Basophils Absolute: 0 10*3/uL (ref 0.0–0.1)
Basophils Relative: 0 %
Eosinophils Absolute: 0 10*3/uL (ref 0.0–0.7)
Eosinophils Relative: 0 %
HCT: 28.4 % — ABNORMAL LOW (ref 36.0–46.0)
HEMOGLOBIN: 9.1 g/dL — AB (ref 12.0–15.0)
LYMPHS ABS: 0.5 10*3/uL — AB (ref 0.7–4.0)
LYMPHS PCT: 8 %
MCH: 31.8 pg (ref 26.0–34.0)
MCHC: 32 g/dL (ref 30.0–36.0)
MCV: 99.3 fL (ref 78.0–100.0)
Monocytes Absolute: 0.3 10*3/uL (ref 0.1–1.0)
Monocytes Relative: 5 %
NEUTROS PCT: 87 %
Neutro Abs: 5.4 10*3/uL (ref 1.7–7.7)
Platelets: 270 10*3/uL (ref 150–400)
RBC: 2.86 MIL/uL — AB (ref 3.87–5.11)
RDW: 15.5 % (ref 11.5–15.5)
WBC: 6.2 10*3/uL (ref 4.0–10.5)

## 2014-11-20 NOTE — Care Management Note (Addendum)
Case Management Note  Patient Details  Name: Miranda Hines MRN: [id number] Date of Birth: Sep 19, 1940  Subjective/Objective:   Pt admitted with acute respiratory failure                 Action/Plan:  Pt states she is independent from home with home O2 supplied by [facility].  Pt lives with both adult son and husband (husband was placed in SNF for rehab post a fall on 10/20/14). Pt has been started on Revatio, CM will submit benefits check.  MD has already initiated the prior authorization process.  CM will continue to monitor for disposition needs.   Expected Discharge Date:                  Expected Discharge Plan:  Home/Self Care  In-House Referral:     Discharge planning Services  CM Consult  Post Acute Care Choice:    Choice offered to:     DME Arranged:   Nebulizer Machine, Rollator,  [facility]  DME Arranged:   Oxygen DME Agency[facility]   [facility]  HH Arranged:    HH Agency:     Status of Service:  In process, will continue to follow  Medicare Important Message Given:  Yes-third notification given Date Medicare IM Given:    Medicare IM give by:    Date Additional Medicare IM Given:    Additional Medicare Important Message give by:     If discussed at Long Length of Stay Meetings, dates discussed:    Additional Comments: 11/20/2014  CM contacted [facility]; equipment could be delivered day of discharge, medication is next day.  [facility] will deliver DME to room and pt will be able to pick up medication at local pharmacy day of discharge, for immediate use. CM offered pt choice for DME other than home 02; pt chose advanced home care because  pt is concerned with not being able to have medication as soon as she gets home.  [facility] informed CM that no additional orders/information would be needed for resumption of home O2 upon discharge.   11/19/14 CM assessed pt; pt no longer SOB with conversation.  Pt refused recommended OT, CM asked pt to contact PCP if she felt [facility]  [facility].  Daughter at bedside.  Per pt and daughter; pts son Caryn BeeKevin will provide 24 hour supervision upon discharge.  Pt requested nebulizer and rollator for home.  CM requested MD for order via physician sticky note and bedside nurse.  11/16/14 MD acknowledged CM concern and agreed to PT/OT evaluation, evaluation ordered.  CM will continue to monitor for disposition needs.  CM re-assessed pt with daughter at bedside.  Pt plans to return home with son, son can provide 24 hour supervision however pt is not able to perform ADLS at this time.  Per daughter; pts [facility] to give pt time to recoup prior to pt having to resuming care for spouse.  Pt remains out of breath from conversation.  CM spoke in detail with both pt and daughter regarding concerns with pt returning home with current oxygenation issue and inability to perform ADLs.  Pt and daughter are open to the idea of possible SNF associated rehab.  CM consulted with CSW and will communicate concern to MD and request PT/OT eval.  CM will continue to monitor for disposition needs.  10/15/14 CM assessed pt.  Benefit check submitted; per pt she can afford to pay  the $45 copay.  Pt stated her preferred pharmacy is [facility] on [address], CM contacted pharmacy and was informed that generic form of medication is available for pickup.  CM provided both CM note and Physician Sticky Note (summary tab) informing MD of benefit check results:  Per pt copay for generic will be 29% co insurance- generic copay will be $45 -prior auth required [id number]  Cherylann Parr, RN 11/20/2014, 10:23 AM Case Management Note  Patient Details  Name: Miranda Hines MRN: [id number] Date of Birth: January 24, 1940  Subjective/Objective:                    Action/Plan:   Expected Discharge Date:                  Expected Discharge Plan:  Home/Self Care  In-House Referral:     Discharge planning Services  CM  Consult  Post Acute Care Choice:    Choice offered to:     DME Arranged:    DME Agency:     HH Arranged:    HH Agency:     Status of Service:  In process, will continue to follow  Medicare Important Message Given:  Yes-third notification given Date Medicare IM Given:    Medicare IM give by:    Date Additional Medicare IM Given:    Additional Medicare Important Message give by:     If discussed at Long Length of Stay Meetings, dates discussed:    Additional Comments:  Cherylann Parr, RN 11/20/2014, 10:23 AM

## 2014-11-20 NOTE — Progress Notes (Signed)
PULMONARY / CRITICAL CARE MEDICINE   Name: Miranda Hines MRN: [id number] DOB: September 01, 1940    ADMISSION DATE:  11/13/2014 CONSULTATION DATE:  11/13/2014  REFERRING MD :  Kendrick Fries  CHIEF COMPLAINT:  Shortness of breath  INITIAL PRESENTATION:  74yo female with hx pulm HTN, ILD directly admitted to hospital from office 11/1 with acute on chronic respiratory failure r/t ILD flare, pulm HTN and pulmonary edema.     STUDIES:  Chest x-ray November 1>> unchanged ILD  11/14/2014 - reports being marginally better. Hx review - on day of right heart cath felt cold which lasted 2-3 days then diaphoresis and mild yellow sputuma nd possible LLE edema for 2 prior to admission. Feelingmore dyspneic but no change in o2 need of 3-4 L. Completed her prednisone given 3 weeks ago. Still on daily lasix CT chest high res 11/3>>> 1. The appearance of the chest is again compatible with interstitial lung disease, and the overall pattern is favored to represent progressively worsening chronic hypersensitivity pneumonitis based on the spectrum of findings and presence of some air trapping on today's examination. 2. Dilatation of the pulmonic trunk again noted, suggestive of pulmonary arterial hypertension. 3. Atherosclerosis, including 2 vessel coronary artery disease. Assessment for potential risk factor modification, dietary therapy or pharmacologic therapy may be warranted, if clinically indicated. 4. Additional incidental findings, as above. BLE venous doppler 11/3>> NEG Port CXR 11/7>> questionable worsening of bilateral interstitial prominence. No focal opacity appreciated.  SUBJECTIVE: Cough continues and is productive of a milky white phlegm. Denies any dyspnea at rest. Continues to have dyspnea on exertion. No subjective fever, chills, or sweats.  REVIEW OF SYSTEMS: Continues to deny any chest pain or pressure. Denies any nausea or vomiting.  VITAL SIGNS: Temp:  [97.5 F (36.4 C)-98 F (36.7 C)]  98 F (36.7 C) (11/08 1324) Pulse Rate:  [63-88] 85 (11/08 1324) Resp:  [18-20] 18 (11/08 1324) BP: (102-130)/(52-64) 130/60 mmHg (11/08 1324) SpO2:  [91 %-98 %] 91 % (11/08 1324) Weight:  [99.7 kg (219 lb 12.8 oz)] 99.7 kg (219 lb 12.8 oz) (11/08 0524)    INTAKE / OUTPUT:  Intake/Output Summary (Last 24 hours) at 11/20/14 1344 Last data filed at 11/20/14 1300  Gross per 24 hour  Intake    960 ml  Output   1651 ml  Net   -691 ml    PHYSICAL EXAMINATION: General:  Morbidly obese. Awake. Sitting up in bed talking on the telephone. Integument:  Warm & dry. No rash on exposed skin.  HEENT:  Moist mucus membranes. No scleral injection or icterus.  Cardiovascular:  Regular rate. Pitting bilateral lower extremity edema persists.  Pulmonary:  Mild crackles bilaterally particularly in the bases. Normal work of breathing on oxygen. Musculoskeletal: Normal bulk & tone.  No joint deformity or effusion appreciated.  LABS:  CBC  Recent Labs Lab 11/14/14 0558 11/18/14 0448 11/20/14 0210  WBC 6.9 9.3 6.2  HGB 9.2* 10.0* 9.1*  HCT 26.8* 31.3* 28.4*  PLT 186 312 270   Coag's No results for input(s): APTT, INR in the last 168 hours. BMET  Recent Labs Lab 11/18/14 0448 11/19/14 0410 11/20/14 0210  NA 138 138 137  K 4.5 4.1 4.0  CL 94* 94* 94*  CO2 32 33* 33*  BUN 24* 30* 28*  CREATININE 0.71 0.67 0.58  GLUCOSE 133* 142* 142*   Electrolytes  Recent Labs Lab 11/16/14 0435 11/17/14 0326 11/18/14 0448 11/19/14 0410 11/20/14 0210  CALCIUM 8.7* 8.6* 9.5 9.0 9.0  MG 1.9 2.1 2.2  --   --   PHOS 3.2 3.3 3.9  --  4.6   Sepsis Markers  Recent Labs Lab 11/14/14 1052 11/16/14 0435 11/18/14 0448  PROCALCITON <0.10 <0.10 <0.10   ABG No results for input(s): PHART, PCO2ART, PO2ART in the last 168 hours. Liver Enzymes  Recent Labs Lab 11/13/14 1919 11/20/14 0210  AST 28  --   ALT 18  --   ALKPHOS 71  --   BILITOT 1.2  --   ALBUMIN 2.9* 2.7*   Cardiac  Enzymes  Recent Labs Lab 11/13/14 1919 11/13/14 2320 11/14/14 0558  TROPONINI <0.03 <0.03 <0.03   Glucose No results for input(s): GLUCAP in the last 168 hours.  Imaging No results found.   ASSESSMENT / PLAN: 74 year old Caucasian female with underlying interstitial lung disease and pulmonary hypertension. Patient continuing to improve off antibiotic therapy. Continues to have intermittent cough. I remain highly suspicious that the patient's acute decompensation was due to a flare of her underlying interstitial lung disease. Certainly an infectious etiology is possible as a precipitating cause. Patient did desaturate significantly when walking requiring up to 6 L/m but ultimately requiring rest to maintain her saturation. Patient's nebulizer regimen & steroids and were adjusted yesterday. She will likely be ready for discharge in the morning depending upon clinical progress.  1. Acute on chronic hypoxic respiratory failure: Likely secondary to flare of interstitial lung disease. Oxygen requirement continuing to improve. Continuing patient on oxygen at 3 L/m at rest & 6 L/m with exertion. This will be further titrated in the outpatient setting. 2. Pulmonary hypertension: Continuing patient on Lasix 40 mg by mouth daily &  Revatio 20 mg by mouth 3 times a day. Continuing to monitor renal function & electrolytes daily while diuresing with Lasix. 3. ILD flare: Continuing prednisone 60 mg by mouth daily. This will be tapered at her follow-up appointment. 4. Possible acute bronchitis: Patient completed course of antibiotics. Continuing mucolytic & Duonebs 3 times a day. 5. Anemia: No source of active bleeding. Hemoglobin appears to be stable. Repeat CBC in the morning. 6. Hyponatremia: Resolved. 7. Prophylaxis: Pepcid 20 mg by mouth daily at bedtime & Protonix by mouth daily. Heparin subcutaneous every 8 hours.  8. Disposition: Patient will likely be able to discharge tomorrow morning depending  upon continued clinical stability.  Donna ChristenJennings E. Jamison NeighborNestor, M.D. [facility] Pulmonary & Critical Care Pager:  [phone] After 3pm or if no response, call [phone] 11/20/2014  1:44 PM

## 2014-11-20 NOTE — Progress Notes (Signed)
Physical Therapy Treatment Patient Details Name: Miranda Hines MRN: [id number] DOB: 1940/03/19 Today's Date: 11/20/2014    History of Present Illness This 74 y.o. Female admitted with progressive cough, weakness, chills and Rt sided chest pain as well as LE edema. Dx: acute hypoxemic respiratory failure on chronic respiratory failure due to interstitial lung disease and idiopathic pulmonary fibrosis    PT Comments    Patient's breathing much improved today. Improved ambulation distance however pt continues to have drop in oxygen saturation with activity. See below. Reviewed energy conservation techniques and lengthy discussion re: performing short bouts of activity at home, using rollator, shortening 02 tubing during exertion, pursed lip breathing etc. Encouraged daily ambulation with RN to improve ventilation. Pt reports phlegm and secretions with exercise. Might benefit from acapella. Will follow acutely per current POC. Great candidate for pulmonary rehab.   Follow Up Recommendations  Outpatient PT (Pulmonary rehab)     Equipment Recommendations  Other (comment) (rollator)    Recommendations for Other Services       Precautions / Restrictions Precautions Precautions: Fall Precaution Comments: watch sats Restrictions Weight Bearing Restrictions: No    Mobility  Bed Mobility               General bed mobility comments: Sitting EOB upon PT arrival.   Transfers Overall transfer level: Modified independent Equipment used: None Transfers: Sit to/from Stand Sit to Stand: Modified independent (Device/Increase time)         General transfer comment: Stood fromE OB x1, from toilet x1 managing lines within room.   Ambulation/Gait Ambulation/Gait assistance: Supervision Ambulation Distance (Feet): 150 Feet Assistive device: Rolling walker (2 wheeled) Gait Pattern/deviations: Step-through pattern;Decreased stride length   Gait velocity interpretation: Below  normal speed for age/gender General Gait Details: Steady gait with use of RW. Sp02 dropped to 87% initially on 6L/min 02- continued to decrease to high 70s, after rest break and pursed lip breathing Sp02 resolved with 90% after 2 minutes. Pt asymptomatic.    Stairs            Wheelchair Mobility    Modified Rankin (Stroke Patients Only)       Balance Overall balance assessment: Needs assistance Sitting-balance support: Feet supported;No upper extremity supported Sitting balance-Leahy Scale: Good     Standing balance support: During functional activity Standing balance-Leahy Scale: Good                      Cognition Arousal/Alertness: Awake/alert Behavior During Therapy: WFL for tasks assessed/performed Overall Cognitive Status: Within Functional Limits for tasks assessed                      Exercises      General Comments General comments (skin integrity, edema, etc.): Sp02 on 2.5L/min 02 at rest ranges from 84-92% depending if pt talking or not. Sp02 after walking to bathroom and back dropped to 70% on 2.5L/min02. Sp02 ranged from 79-92% on 6L/min 02 with activity.      Pertinent Vitals/Pain Pain Assessment: No/denies pain    Home Living                      Prior Function            PT Goals (current goals can now be found in the care plan section) Progress towards PT goals: Progressing toward goals    Frequency  Min 2X/week    PT Plan Current plan remains appropriate  Co-evaluation             End of Session Equipment Utilized During Treatment: Gait belt;Oxygen Activity Tolerance: Patient tolerated treatment well;Treatment limited secondary to medical complications (Comment) (drop in Sp02) Patient left: in bed;with call bell/phone within reach     Time: 0921-0946 PT Time Calculation (min) (ACUTE ONLY): 25 min  Charges:  $Gait Training: 8-22 mins $Self Care/Home Management: 8-22                    G Codes:       Ellyse Rotolo A Ricki Vanhandel 11/20/2014, 10:37 AM Mylo RedShauna Iviona Hole, PT, DPT [phone]

## 2014-11-20 NOTE — Telephone Encounter (Signed)
[facility] . Key: [id number] Sent for review. Will await response.

## 2014-11-20 NOTE — Care Management Important Message (Signed)
Important Message  Patient Details  Name: Miranda Hines MRN: [id number] Date of Birth: 1940/08/31   Medicare Important Message Given:  Yes-third notification given    Kyla BalzarineShealy, Nyari Olsson Abena 11/20/2014, 10:19 AM

## 2014-11-20 NOTE — Progress Notes (Signed)
UR Completed. Noor Vidales, RN, BSN.  [phone] 

## 2014-11-21 MED ORDER — TRAMADOL HCL 50 MG PO TABS: 50.0000 mg | ORAL_TABLET | Freq: Two times a day (BID) | ORAL | Status: DC | PRN

## 2014-11-21 MED ORDER — IPRATROPIUM-ALBUTEROL 0.5-2.5 (3) MG/3ML IN SOLN: 3.0000 mL | Freq: Three times a day (TID) | RESPIRATORY_TRACT | Status: DC

## 2014-11-21 MED ORDER — PREDNISONE 10 MG PO TABS: ORAL_TABLET | ORAL | Status: DC

## 2014-11-21 NOTE — Care Management Note (Addendum)
Case Management Note  Patient Details  Name: Miranda Hines MRN: [id number] Date of Birth: 1940/06/13  Subjective/Objective:   Pt admitted with acute respiratory failure                 Action/Plan:  Pt states she is independent from home with home O2 supplied by [facility].  Pt lives with both adult son and husband (husband was placed in SNF for rehab post a fall on 10/20/14). Pt has been started on Revatio, CM will submit benefits check.  MD has already initiated the prior authorization process.  CM will continue to monitor for disposition needs.   Expected Discharge Date:                  Expected Discharge Plan:  Home/Self Care  In-House Referral:     Discharge planning Services  CM Consult  Post Acute Care Choice:    Choice offered to:     DME Arranged:   Nebulizer Machine, Rollator,  DME Agency:   [facility]  DME Arranged:   Oxygen DME Agency:   [facility]  HH Arranged:    HH Agency:     Status of Service: Complete, will sign off  Medicare Important Message Given:  Yes-third notification given Date Medicare IM Given:    Medicare IM give by:    Date Additional Medicare IM Given:    Additional Medicare Important Message give by:     If discussed at Long Length of Stay Meetings, dates discussed:    Additional Comments: 11/21/2014  Pt will discharge home today in the care of son and daughter.  [facility] contacted and informed of pt discharge today. CM verified with pt that she has portable oxygen tank in room for transport home 11/20/14 CM contacted [facility]; equipment could be delivered day of discharge, medication is next day.  [facility] will deliver DME to room and pt will be able to pick up medication at local pharmacy day of discharge, for immediate use. CM offered pt choice for DME other than home 02; [facility] because  pt is concerned with not being able to have medication as soon as she gets home.  [facility] informed CM that no additional orders/information would  be needed for resumption of home O2 upon discharge.   11/19/14 CM assessed pt; pt no longer SOB with conversation.  Pt refused recommended OT, CM asked pt to contact PCP if she felt she could benefit from [facility] in the future.  Daughter at bedside.  Per pt and daughter; pts son Caryn Bee will provide 24 hour supervision upon discharge.  Pt requested nebulizer and rollator for home.  CM requested MD for order via physician sticky note and bedside nurse.  11/16/14 MD acknowledged CM concern and agreed to PT/OT evaluation, evaluation ordered.  CM will continue to monitor for disposition needs.  CM re-assessed pt with daughter at bedside.  Pt plans to return home with son, son can provide 24 hour supervision however pt is not able to perform ADLS at this time.  Per daughter; pts husband will remain at [facility] SNF to give pt time to recoup prior to pt having to resuming care for spouse.  Pt remains out of breath from conversation.  CM spoke in detail with both pt and daughter regarding concerns with pt returning home with current oxygenation issue and inability to perform ADLs.  Pt and daughter are open to the idea of possible SNF associated rehab.  CM consulted with CSW and  will communicate concern to MD and request PT/OT eval.  CM will continue to monitor for disposition needs.  10/15/14 CM assessed pt.  Benefit check submitted; per pt she can afford to pay the $45 copay.  Pt stated her preferred pharmacy is [facility] [address], CM contacted pharmacy and was informed that generic form of medication is available for pickup.  CM provided both CM note and Physician Sticky Note (summary tab) informing MD of benefit check results:  Per pt copay for generic will be 29% co insurance- generic copay will be $45 -prior auth required [id number]  Cherylann ParrClaxton, Glenetta Kiger S, RN 11/21/2014, 10:08 AM Case Management Note  Patient Details  Name: Miranda Hines MRN: [id number] Date of Birth:  1940/03/19  Subjective/Objective:                    Action/Plan:   Expected Discharge Date:                  Expected Discharge Plan:  Home/Self Care  In-House Referral:     Discharge planning Services  CM Consult  Post Acute Care Choice:    Choice offered to:     DME Arranged:    DME Agency:     HH Arranged:    HH Agency:     Status of Service:  In process, will continue to follow  Medicare Important Message Given:  Yes-third notification given Date Medicare IM Given:    Medicare IM give by:    Date Additional Medicare IM Given:    Additional Medicare Important Message give by:     If discussed at Long Length of Stay Meetings, dates discussed:    Additional Comments:  Cherylann ParrClaxton, Yeimy Brabant S, RN 11/21/2014, 10:08 AM

## 2014-11-21 NOTE — Progress Notes (Signed)
PULMONARY / CRITICAL CARE MEDICINE   Name: Miranda Hines MRN: [id number] DOB: 1940/01/13    ADMISSION DATE:  11/13/2014 CONSULTATION DATE:  11/13/2014  REFERRING MD :  Kendrick FriesMcQuaid  CHIEF COMPLAINT:  Shortness of breath  INITIAL PRESENTATION:  74yo female with hx pulm HTN, ILD directly admitted to hospital from office 11/1 with acute on chronic respiratory failure r/t ILD flare, pulm HTN and pulmonary edema.    STUDIES:  Chest x-ray November 1>> unchanged ILD  11/14/2014 - reports being marginally better. Hx review - on day of right heart cath felt cold which lasted 2-3 days then diaphoresis and mild yellow sputuma nd possible LLE edema for 2 prior to admission. Feelingmore dyspneic but no change in o2 need of 3-4 L. Completed her prednisone given 3 weeks ago. Still on daily lasix CT chest high res 11/3>>> 1. The appearance of the chest is again compatible with interstitial lung disease, and the overall pattern is favored to represent progressively worsening chronic hypersensitivity pneumonitis based on the spectrum of findings and presence of some air trapping on today's examination. 2. Dilatation of the pulmonic trunk again noted, suggestive of pulmonary arterial hypertension. 3. Atherosclerosis, including 2 vessel coronary artery disease. Assessment for potential risk factor modification, dietary therapy or pharmacologic therapy may be warranted, if clinically indicated. 4. Additional incidental findings, as above. BLE venous doppler 11/3>> NEG Port CXR 11/7>> questionable worsening of bilateral interstitial prominence. No focal opacity appreciated.  SUBJECTIVE: [place] overnight. Cough persistent and productive of cloudy phlegm. Denies any subjective fever, chills, or sweats. Ambulated with use of a walker around the unit yesterday with minimal dyspnea but significant desaturation.  REVIEW OF SYSTEMS: No nausea or abdominal pain. No chest pain or pressure.  VITAL SIGNS: Temp:   [97.9 F (36.6 C)-98.2 F (36.8 C)] 98.2 F (36.8 C) (11/09 0330) Pulse Rate:  [69-85] 69 (11/09 0330) Resp:  [18] 18 (11/09 0330) BP: (97-130)/(50-60) 97/51 mmHg (11/09 0330) SpO2:  [91 %-95 %] 94 % (11/09 0330) Weight:  [101.107 kg (222 lb 14.4 oz)] 101.107 kg (222 lb 14.4 oz) (11/09 0330)    INTAKE / OUTPUT:  Intake/Output Summary (Last 24 hours) at 11/21/14 1023 Last data filed at 11/21/14 0800  Gross per 24 hour  Intake   1200 ml  Output    301 ml  Net    899 ml    PHYSICAL EXAMINATION: General:  Morbidly obese. Awake. Sitting up in bed. Integument:  Warm & dry. No rash on exposed skin.  HEENT:  Moist mucus membranes. No scleral injection.  Cardiovascular:  Regular rate. Pitting bilateral lower extremity edema persists but mildly improved.  Pulmonary:  Mild crackles bilaterally particularly in the bases relatively unchanged. Normal work of breathing on oxygen. Musculoskeletal: Normal bulk & tone.  No joint deformity or effusion appreciated.  LABS:  CBC  Recent Labs Lab 11/18/14 0448 11/20/14 0210  WBC 9.3 6.2  HGB 10.0* 9.1*  HCT 31.3* 28.4*  PLT 312 270   Coag's No results for input(s): APTT, INR in the last 168 hours. BMET  Recent Labs Lab 11/18/14 0448 11/19/14 0410 11/20/14 0210  NA 138 138 137  K 4.5 4.1 4.0  CL 94* 94* 94*  CO2 32 33* 33*  BUN 24* 30* 28*  CREATININE 0.71 0.67 0.58  GLUCOSE 133* 142* 142*   Electrolytes  Recent Labs Lab 11/16/14 0435 11/17/14 0326 11/18/14 0448 11/19/14 0410 11/20/14 0210  CALCIUM 8.7* 8.6* 9.5 9.0 9.0  MG 1.9 2.1 2.2  --   --  PHOS 3.2 3.3 3.9  --  4.6   Sepsis Markers  Recent Labs Lab 11/14/14 1052 11/16/14 0435 11/18/14 0448  PROCALCITON <0.10 <0.10 <0.10   ABG No results for input(s): PHART, PCO2ART, PO2ART in the last 168 hours. Liver Enzymes  Recent Labs Lab 11/20/14 0210  ALBUMIN 2.7*   Cardiac Enzymes No results for input(s): TROPONINI, PROBNP in the last 168  hours. Glucose No results for input(s): GLUCAP in the last 168 hours.  Imaging No results found.   ASSESSMENT / PLAN: 74 year old Caucasian female with underlying interstitial lung disease and pulmonary hypertension. Patient continuing to improve off antibiotic therapy. Continues to have intermittent cough. I remain highly suspicious that the patient's acute decompensation was due to a flare of her underlying interstitial lung disease. Patient continues to have a significantly increased oxygen requirement with exertion. She'll be continued on her oral prednisone dose with further tapering as an outpatient.  1. Acute on chronic hypoxic respiratory failure: Likely secondary to flare of interstitial lung disease. Oxygen requirement continuing to improve. Continuing patient on oxygen at 3 L/m at rest & 6 L/m with exertion. This will be further titrated in the outpatient setting. 2. Pulmonary hypertension: Continuing patient on Lasix 40 mg by mouth daily &  Revatio 20 mg by mouth 3 times a day. Continuing to hold off on initiating Letairis. 3. ILD flare: Continuing prednisone 60 mg by mouth daily. Plan to taper by 10 mg every 7 days until patient follows up with Dr. Marchelle Gearing. 4. Possible acute bronchitis: Patient completed course of antibiotics. Continuing mucolytic & Duonebs 3 times a day. Also continuing Acapella. 5. Anemia: No source of active bleeding. Hemoglobin has been stable.  6. Hyponatremia: Resolved. 7. Prophylaxis: Pepcid 20 mg by mouth daily at bedtime & Protonix by mouth daily. Heparin subcutaneous every 8 hours.  8. Disposition: Plan to discharge to home today. Oxygen supplied by [facility]. Home nebulizer & rolling walker with seat ordered.  Donna Christen Jamison Neighbor, M.D. [facility] Pulmonary [facility] Critical Care Pager:  [phone] After 3pm or if no response, call [phone] 11/21/2014  10:23 AM

## 2014-11-21 NOTE — Discharge Summary (Signed)
Physician Discharge Summary  Patient ID: Zanovia D Martucci MRN: [id number] DOB/AGE: 1940/05/04 74 y.o.  Admit date: 11/13/2014 Discharge date: 11/21/2014  Problem List Active Problems:   Acute respiratory failure with hypoxemia (HCC)   ILD (interstitial lung disease) (HCC)   Palliative care encounter   Acute respiratory failure [facility])  HPI: Mrs. Oertel was referred to me today for evaluation of pulmonary hypertension. She has a history of interstitial lung disease which is poorly understood. She has been followed by our partners here in the office and apparently has had some response to steroids in the past. 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 Apparently at one point she did work in her room which previously contained cobalt but she never worked directly with it or other chemicals. However, she's had shortness of breath off and on for several years but she's been aware of her diagnosis of pulmonary fibrosis for 2 years. She says that in the last 6 months she's been experiencing increasing shortness of breath and symptoms. She has also noted that her oxygen level has been dropping more frequently. Our office staff notes that frequently when she would come for visit several months ago that her O2 saturation would commonly be in the 70s range on room air when ambulating.  However, she's had progression of shortness of breath associated with increasing leg swelling in the last several weeks. She was hospitalized earlier this month and was treated with IV Lasix which improved her symptoms. She did up having a right heart catheterization on 11/08/2014 which showed a pulmonary arterial pressure of 45/19 (mean 32), pulmonary capillary wedge pressure of 8. Pulmonary arterial oxygen saturation was 62%, arterial O2 saturation 100%. Cardiac output was 3.69 by Fick, cardiac index 2.15. Pulmonary vascular resistance was  6.5 Woods units.  For the last 3-4 days she's had progressive cough, weakness, mucus production, and chills. She says this is associated with a mild right-sided chest pain. She's been significantly more short of breath during this time. She's also noted increasing leg swelling.  Hospital Course:  STUDIES:  Chest x-ray November 1>> unchanged ILD  11/14/2014 - reports being marginally better. Hx review - on day of right heart cath felt cold which lasted 2-3 days then diaphoresis and mild yellow sputuma nd possible LLE edema for 2 prior to admission. Feelingmore dyspneic but no change in o2 need of 3-4 L. Completed her prednisone given 3 weeks ago. Still on daily lasix CT chest high res 11/3>>> 1. The appearance of the chest is again compatible with interstitial lung disease, and the overall pattern is favored to represent progressively worsening chronic hypersensitivity pneumonitis based on the spectrum of findings and presence of some air trapping on today's examination. 2. Dilatation of the pulmonic trunk again noted, suggestive of pulmonary arterial hypertension. 3. Atherosclerosis, including 2 vessel coronary artery disease. Assessment for potential risk factor modification, dietary therapy or pharmacologic therapy may be warranted, if clinically indicated. 4. Additional incidental findings, as above. BLE venous doppler 11/3>> NEG Port CXR 11/7>> questionable worsening of bilateral interstitial prominence. No focal opacity appreciated.   ASSESSMENT / PLAN: 74 year old Caucasian female with underlying interstitial lung disease and pulmonary hypertension. Patient continuing to improve off antibiotic therapy. Continues to have intermittent cough. I remain highly suspicious that the patient's acute decompensation was due to a flare of her underlying interstitial lung disease. Patient continues to have a significantly increased oxygen requirement with exertion. She'll be  continued on her oral  prednisone dose with further tapering as an outpatient. Patient's nebulizer regimen & steroids and were adjusted 11/7. Discharged 11/9 in improved condtion with [facility]HB.  1. Acute on chronic hypoxic respiratory failure: Likely secondary to flare of interstitial lung disease. Oxygen requirement continuing to improve. Continuing patient on oxygen at 3 L/m at rest & 6 L/m with exertion. This will be further titrated in the outpatient setting. 2. Pulmonary hypertension: Continuing patient on Lasix 40 mg by mouth daily & Revatio 20 mg by mouth 3 times a day. Holding on initiating Letairis. 3. ILD flare: Continuing prednisone 60 mg by mouth daily. This will be tapered at her follow-up appointment. Dr. Marchelle Gearing to adjust. She is to decrease to 50 mg after seven days, Further adjustment per MD.  4. Possible acute bronchitis: Patient completed course of antibiotics. Continuing mucolytic & Duonebs 3 times a day. 5. Anemia: No source of active bleeding. Hemoglobin appears to be stable.  6. Hyponatremia: Resolved..  7. Disposition: Patient will be discharged.   Labs at discharge Lab Results  Component Value Date   CREATININE 0.58 11/20/2014   BUN 28* 11/20/2014   NA 137 11/20/2014   K 4.0 11/20/2014   CL 94* 11/20/2014   CO2 33* 11/20/2014   Lab Results  Component Value Date   WBC 6.2 11/20/2014   HGB 9.1* 11/20/2014   HCT 28.4* 11/20/2014   MCV 99.3 11/20/2014   PLT 270 11/20/2014   Lab Results  Component Value Date   ALT 18 11/13/2014   AST 28 11/13/2014   ALKPHOS 71 11/13/2014   BILITOT 1.2 11/13/2014   Lab Results  Component Value Date   INR 0.95 10/29/2014    Current radiology studies No results found.  Disposition:  01-Home or Self Care  Discharge Instructions    Discharge patient    Complete by:  As directed             Medication List    TAKE these medications        aspirin 81 MG tablet  Take 81 mg by mouth daily.     famotidine 20 MG tablet  Commonly known  as:  PEPCID  Take 1 tablet (20 mg total) by mouth at bedtime.     fluticasone 50 MCG/ACT nasal spray  Commonly known as:  FLONASE  Place 2 sprays into both nostrils daily.     furosemide 40 MG tablet  Commonly known as:  LASIX  Take 1 tablet (40 mg total) by mouth daily.     guaiFENesin 600 MG 12 hr tablet  Commonly known as:  MUCINEX  Take 1,200 mg by mouth as needed for congestion.     ipratropium-albuterol 0.5-2.5 (3) MG/3ML Soln  Commonly known as:  DUONEB  Take 3 mLs by nebulization 3 (three) times daily.     pantoprazole 40 MG tablet  Commonly known as:  PROTONIX  TAKE 1 TABLET BY MOUTH 30 TO 60 MINUTES BEFORE FIRST MEAL OF THE DAY     potassium chloride 10 MEQ tablet  Commonly known as:  K-DUR  Take 1 tablet (10 mEq total) by mouth daily.     predniSONE 10 MG tablet  Commonly known as:  DELTASONE  60 mg daily x 7 days, then 50 mg daily and stay till you see your doctor.     sildenafil 20 MG tablet  Commonly known as:  REVATIO  Take 1 tablet (20 mg total) by mouth 3 (three) times daily.  SIMPLY SALINE 0.9 % Aers  Generic drug:  Saline  Place 1 spray into the nose daily as needed (allergies).     traMADol 50 MG tablet  Commonly known as:  ULTRAM  Take 1 tablet (50 mg total) by mouth every 12 (twelve) hours as needed for moderate pain.           Follow-up Information    [facility]   Why:  rollator, nebulizer machine   Contact information:   [address] [place] [phone]       [facility]   Why:  Oxygen   Contact information:   [address] [place] [address] [phone]       [facility] On 11/27/2014.   Specialty:  Pulmonary Disease   Why:  Dr. Marchelle Gearingamaswamy at 230 pm   Contact information:   [address] [place] [address] [phone]        Discharged Condition: fair  Time spent on discharge greater than 40  minutes.  Vital signs at Discharge. Temp:  [97.9 F (36.6 C)-98.2 F (36.8 C)] 98.2 F (36.8 C) (11/09 0330) Pulse Rate:  [69-85] 69 (11/09 0330) Resp:  [18-20] 18 (11/09 0330) BP: (97-130)/(50-60) 97/51 mmHg (11/09 0330) SpO2:  [91 %-97 %] 94 % (11/09 0330) Weight:  [222 lb 14.4 oz (101.107 kg)] 222 lb 14.4 oz (101.107 kg) (11/09 0330) Office follow up Special Information or instructions. She is to follow up with Dr. Marchelle Gearingamaswamy on 11/27/14. Note: She has been placed on TID duo nebs which help her. Started on Tramadol for back pain which appears to work well.  She is on slow steroid taper which will need to be reviewed by Dr. Marchelle Gearingamaswamy on 11/27/14. Signed: Brett CanalesSteve Minor ACNP [facility] Pager [phone] till 3 pm If no answer page [phone] 11/21/2014, 8:28 AM  PCCM Attending Note:  Discharge summary note reviewed. Continue current plan as stated above. Examination unchanged on day of discharge. Home nebulizer & rolling walker previously ordered. Patient will be continued on her current nebulizer regimen. I spent 22 minutes today independent of our nurse practitioner in the examination of the patient and planning the patient's discharge from hospital.  Marlyne BeardsJennings E. Jamison NeighborNestor, M.D. [place] [facility] Pager:  [phone] After 3pm or if no response, call [phone]

## 2014-11-22 ENCOUNTER — Encounter (HOSPITAL_COMMUNITY): Payer: Medicare Other

## 2014-11-22 ENCOUNTER — Telehealth: Payer: Self-pay | Admitting: Internal Medicine

## 2014-11-22 NOTE — Telephone Encounter (Signed)
[facility] Berkley Harveyauth, [phone] or [phone].  Patient is waiting for her medication.

## 2014-11-22 NOTE — Care Management (Signed)
CM contacted by Pts daughter.  Pt was discharged home 11/21/14  on Revatio generic but pt was unable to get the script filled yesterday per pharmacy prior authorization was not complete.  Pt missed 2 does of the TID recommended frequency for yesterday.  CM contacted Pulmonary Critical Care and left message for nurse to call back.  CM contacted pharmacy and spoke with pharmacist Leatha GildingMallory, pharmacist agreed to give emergency 5 day supply of medication awaiting prior approval process completion.  Pharmacist informed CM that prescription arrived via fax 11/13/14, pharmacist agreed to resend prescription to MD requesting prior authorization.  CM informed pt's daughter Babette Relicammy that 5 day supply is now available for immediate pick up.  Per H&P; prior authorization process had already been started.  11/15/14: CM submitted benefit check and provided prior authorization information in a Case Management note and on physician sticky note under the summary tab in Epic.

## 2014-11-22 NOTE — Telephone Encounter (Signed)
Spoke with Sanmina-SCISamantha. Advised her that the PA for Revatio has been started. She will notify the pt of this. Message will be closed since there is another message open in regards to the PA.

## 2014-11-22 NOTE — Telephone Encounter (Signed)
Medication has been approved until 11/20/2015. PA# [id number] Pharmacy informed.

## 2014-11-22 NOTE — Telephone Encounter (Signed)
Called # provided for case manager and received message # you have reached is not in service. Will await call back

## 2014-11-27 ENCOUNTER — Ambulatory Visit (INDEPENDENT_AMBULATORY_CARE_PROVIDER_SITE_OTHER): Payer: Medicare Other | Admitting: Internal Medicine

## 2014-11-27 ENCOUNTER — Encounter (HOSPITAL_COMMUNITY): Payer: Medicare Other

## 2014-11-27 ENCOUNTER — Encounter: Payer: Self-pay | Admitting: Internal Medicine

## 2014-11-27 VITALS — BP 122/80 | HR 90 | Ht 61.0 in | Wt 213.0 lb

## 2014-11-27 DIAGNOSIS — I272 Other secondary pulmonary hypertension: Secondary | ICD-10-CM | POA: Diagnosis not present

## 2014-11-27 DIAGNOSIS — J8410 Pulmonary fibrosis, unspecified: Secondary | ICD-10-CM | POA: Diagnosis not present

## 2014-11-27 DIAGNOSIS — J9611 Chronic respiratory failure with hypoxia: Secondary | ICD-10-CM | POA: Diagnosis not present

## 2014-11-27 NOTE — Progress Notes (Signed)
Subjective:     Patient ID: Miranda Hines, female   DOB: [birth year], 74 y.o.   MRN: [id number]  HPI     OV 11/27/2014  Chief Complaint  Patient presents with  . Follow-up    Pt here for HFU for ILD. Pt states her breathing has improved since d/c. Pt c/o mild prod cough with cloudy mucus. Denies CP/tightness.    Follow-up chronic hypoxemic respiratory failure severe in the setting of interstitial lung disease not classified and associated for hypertension  11/16/2014 she saw Dr. Kendrick Fries for evaluation of pulmonary hypertension. But he was concerned about hospital-acquired pneumonia and she was admitted. It turns out clinically that she likely had a interstitial lung disease flareup because she come off prednisone. She did not respond anabiotic's in the hospital. She was reinitiated on steroids and started feeling better. She was also started on Lasix. Today she tells me that it is approximately 2 weeks since she started her revatio and she feels it is subjectively helped with her dyspnea. However objectively Walk test on 6L and 185 feet x 1/2 lap and ddeat to 70s - baseline similar to recent months. Overall she feels Lasix, oxygen, sildenafil or helping her situation. She did meet with palliative care in the hospital. She is not inclined towards transplantation (daughter Rogers Seeds affirms). She is not inclined to with rapid weight loss to with transplantation. She is more interested in eating healthy. Her husband is sick with colon cancer and is requiring a lot of her attention. She is the main caregiver. She wants to hold off on pulmonary rehabilitation. Her current medication regimen includes prednisone which is on a taper, oxygen 4-6 liters, sildenafil. Subjectively she is better. She is interested in endothelin receptor antagonist for pulmonary hypertension. Overall she and her daughter aware of the poor prognosis from her chronic hypoxemic respiratory failure.  Current outpatient  prescriptions:  .  aspirin 81 MG tablet, Take 81 mg by mouth daily., Disp: , Rfl:  .  famotidine (PEPCID) 20 MG tablet, Take 1 tablet (20 mg total) by mouth at bedtime., Disp: 90 tablet, Rfl: 3 .  fluticasone (FLONASE) 50 MCG/ACT nasal spray, Place 2 sprays into both nostrils daily., Disp: 16 g, Rfl: 0 .  furosemide (LASIX) 40 MG tablet, Take 1 tablet (40 mg total) by mouth daily., Disp: 30 tablet, Rfl: 0 .  guaiFENesin (MUCINEX) 600 MG 12 hr tablet, Take 1,200 mg by mouth as needed for congestion., Disp: , Rfl:  .  ipratropium-albuterol (DUONEB) 0.5-2.5 (3) MG/3ML SOLN, Take 3 mLs by nebulization 3 (three) times daily., Disp: 360 mL, Rfl: 12 .  pantoprazole (PROTONIX) 40 MG tablet, TAKE 1 TABLET BY MOUTH 30 TO 60 MINUTES BEFORE FIRST MEAL OF THE DAY, Disp: 90 tablet, Rfl: 1 .  potassium chloride (K-DUR) 10 MEQ tablet, Take 1 tablet (10 mEq total) by mouth daily., Disp: 30 tablet, Rfl: 0 .  predniSONE (DELTASONE) 10 MG tablet, 60 mg daily x 7 days, then 50 mg daily and stay till you see your doctor., Disp: 150 tablet, Rfl: 1 .  Saline (SIMPLY SALINE) 0.9 % AERS, Place 1 spray into the nose daily as needed (allergies). , Disp: , Rfl:  .  sildenafil (REVATIO) 20 MG tablet, Take 1 tablet (20 mg total) by mouth 3 (three) times daily., Disp: 90 tablet, Rfl: 5 .  traMADol (ULTRAM) 50 MG tablet, Take 1 tablet (50 mg total) by mouth every 12 (twelve) hours as needed for moderate pain., Disp: 30 tablet,  Rfl: 1   Review of Systems     Objective:   Physical Exam  Constitutional: She is oriented to person, place, and time. She appears well-developed and well-nourished. No distress.  Obese Body mass index is 40.27 kg/(m^2).   HENT:  Head: Normocephalic and atraumatic.  Right Ear: External ear normal.  Left Ear: External ear normal.  Mouth/Throat: Oropharynx is clear and moist. No oropharyngeal exudate.  cushingoid  Eyes: Conjunctivae and EOM are normal. Pupils are equal, round, and reactive to  light. Right eye exhibits no discharge. Left eye exhibits no discharge. No scleral icterus.  Neck: Normal range of motion. Neck supple. No JVD present. No tracheal deviation present. No thyromegaly present.  Cardiovascular: Normal rate, regular rhythm, normal heart sounds and intact distal pulses.  Exam reveals no gallop and no friction rub.   No murmur heard. Pulmonary/Chest: Effort normal. No respiratory distress. She has no wheezes. She has rales. She exhibits no tenderness.  On o2  Abdominal: Soft. Bowel sounds are normal. She exhibits no distension and no mass. There is no tenderness. There is no rebound and no guarding.  Musculoskeletal: Normal range of motion. She exhibits no edema or tenderness.  Lymphadenopathy:    She has no cervical adenopathy.  Neurological: She is alert and oriented to person, place, and time. She has normal reflexes. No cranial nerve deficit. She exhibits normal muscle tone. Coordination normal.  Skin: Skin is warm and dry. No rash noted. She is not diaphoretic. No erythema. No pallor.  Psychiatric: She has a normal mood and affect. Her behavior is normal. Judgment and thought content normal.  Vitals reviewed.  Filed Vitals:   11/27/14 1510 11/27/14 1511 11/27/14 1512  BP:   122/80  Pulse:  104 90  Height:    (1.549 m)  Weight:   213 lb (96.616 kg)  SpO2: 67% 92% 91%        Assessment:       ICD-9-CM ICD-10-CM   1. Chronic respiratory failure with hypoxia (HCC) 518.83 J96.11    799.02    2. 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 Pulmonary hypertension (HCC) 416.8 I27.2        Plan:      Glad better from recent discharge  PLAN - continue oxygen - 4-6L [place] - aim to keep pulse ox > 75% - ideally > 85% - hold off on rehab -due to several medical and social and logisitcail issues - respect lack of interest in transplant - for healthy diet  - take low glycemic diet sheet - for ILD  - continue prednisone  per day and then  in a week down to  per day and then a week later down to  per day and continue at that dose before we reases further taper  - We discussed anti-fibrotic therapies but at this point will hold off given her severe disease and focus on trying to help her quality of life and no proven benefit of anti-fibrotic therapies in advanced interstitial lung disease - for pulmonary hypertension  - cotninue revatio - glad you feel is helping  - wil ask Dr Kendrick Fries nurse to initiate  Process for Letairis  Followup  - 6-8 weeks with Dr Marchelle Gearing or sooner if needed  > 50% of this > 25 min visit spent in face to face counseling or coordination of care   Dr. Kalman Shan, M.D., [facility].C.P Pulmonary and Critical Care Medicine Staff Physician [facility]  [facility] Pager: [phone], If no answer or between  15:00h - 7:00h: call [phone]  11/27/2014 3:48 PM

## 2014-11-27 NOTE — Patient Instructions (Addendum)
ICD-9-CM ICD-10-CM   1. Chronic respiratory failure with hypoxia (HCC) 518.83 J96.11    799.02    2. 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 Pulmonary hypertension (HCC) 416.8 I27.2    Glad better from recent discharge  PLAN - continue oxygen - 4-6L [place] - aim to keep pulse ox > 75% - ideally > 85% - hold off on rehab -due to several medical and social and logisitcail issues - respect lack of interest in transplant - for healthy diet  - take low glycemic diet sheet - for ILD  - continue prednisone 50mg  per day and then in a week down to 40mg  per day and then a week later down to 30mg  per day and continue at that dose before we reases further taper - for pulmonary hypertension  - cotninue revatio - glad you feel is helping  - wil ask Dr Kendrick FriesMcQuaid nurse to initiate  Process for Letairis  Followup  - 6-8 weeks with Dr Marchelle Gearingamaswamy or sooner if needed

## 2014-11-29 ENCOUNTER — Encounter (HOSPITAL_COMMUNITY): Payer: Medicare Other

## 2014-12-03 ENCOUNTER — Telehealth: Payer: Self-pay | Admitting: Internal Medicine

## 2014-12-03 MED ORDER — FUROSEMIDE 40 MG PO TABS: 40.0000 mg | ORAL_TABLET | Freq: Every day | ORAL | Status: DC

## 2014-12-03 MED ORDER — POTASSIUM CHLORIDE ER 10 MEQ PO TBCR: 10.0000 meq | EXTENDED_RELEASE_TABLET | Freq: Every day | ORAL | Status: DC

## 2014-12-03 NOTE — Telephone Encounter (Signed)
Called spoke with pt. Aware RX's sent in. Nothing further needed 

## 2014-12-03 NOTE — Telephone Encounter (Signed)
Pt is requesting refill on K and lasix. I do not see where this has been refilled by your MR. Is this okay? Pt was seen on 11/15. thanks

## 2014-12-03 NOTE — Telephone Encounter (Signed)
We started this in the hospital. Ok to refill

## 2014-12-04 ENCOUNTER — Encounter (HOSPITAL_COMMUNITY): Payer: Medicare Other | Attending: Internal Medicine

## 2014-12-04 DIAGNOSIS — J849 Interstitial pulmonary disease, unspecified: Secondary | ICD-10-CM | POA: Insufficient documentation

## 2014-12-06 ENCOUNTER — Encounter (HOSPITAL_COMMUNITY): Payer: Medicare Other

## 2014-12-11 ENCOUNTER — Encounter (HOSPITAL_COMMUNITY): Payer: Medicare Other

## 2014-12-13 ENCOUNTER — Encounter (HOSPITAL_COMMUNITY): Payer: Medicare Other

## 2014-12-18 ENCOUNTER — Telehealth: Payer: Self-pay | Admitting: Internal Medicine

## 2014-12-18 ENCOUNTER — Encounter (HOSPITAL_COMMUNITY): Payer: Medicare Other

## 2014-12-18 NOTE — Progress Notes (Signed)
Pulmonary Rehab Discharge Note Miranda Hines has been discharged from pulmonary rehab after only exercising with us 1 session.  She has been very sick, in and out of the hospital and her husband is in a skilled nursing facility because she is too ill to care for him.  She is under several doctor's care and they are trying to find out what is the cause of her illness.  She is not appropriate for our exercise sessions at this point and Dr. Marchelle Gearingamaswamy has asked patient to not exercise at this time.

## 2014-12-18 NOTE — Addendum Note (Signed)
Encounter addended by: Drema PryJoan A Senai Kingsley, RN on: 12/18/2014  4:15 PM<BR>     Documentation filed: Notes Section

## 2014-12-18 NOTE — Telephone Encounter (Signed)
Spoke with pt. She is referring to MotorolaLetairis. States that MR advised her that we would get the process started for this. In his documentation, he was going to advised BQ's nurse to start the paperwork. Morrie Sheldonshley was never made aware of this.  Robynn Panelise - did you start this process? I do not want to start this if you already have. Thanks.

## 2014-12-19 NOTE — Telephone Encounter (Signed)
Did not see anything in MR's box regarding Letairis paperwork.  Robynn Panelise, are you working on this?  Please advise.

## 2014-12-20 ENCOUNTER — Encounter (HOSPITAL_COMMUNITY): Payer: Medicare Other

## 2014-12-20 NOTE — Telephone Encounter (Signed)
Spoke with Robynn Panelise and this process has not been started yet. Form for Kathalene FramesLetairis has been started. There are some parts of the form that MR will need to fill out. Form has been placed in MR's look at. Once our information has been filled out the pt has 2 sections she will need to sign. Will route message to [place] to follow up on.

## 2014-12-25 ENCOUNTER — Encounter (HOSPITAL_COMMUNITY): Payer: Medicare Other

## 2014-12-25 NOTE — Telephone Encounter (Signed)
Awaiting MR to sign forms.

## 2014-12-27 ENCOUNTER — Encounter (HOSPITAL_COMMUNITY): Payer: Medicare Other

## 2014-12-27 NOTE — Telephone Encounter (Signed)
Called and spoke to pt. Informed her the Yvonna AlanisLetaris form has been signed by MR and is now needing the pt's signature. Pt stated she will have her daughter pick form up to take back home for pt to sign then bring back. Form has been left up front for pick up. Pt verbalized understanding.

## 2014-12-28 NOTE — Telephone Encounter (Signed)
Forms have not been picked up yet. They are still located up front.

## 2015-01-01 ENCOUNTER — Encounter (HOSPITAL_COMMUNITY): Payer: Medicare Other

## 2015-01-01 NOTE — Telephone Encounter (Signed)
This was received and has been faxed to the provided fax number on the form. Will await PA.

## 2015-01-01 NOTE — Telephone Encounter (Signed)
These forms were dropped off yesterday by the pt's daughter. Forms were placed in MR's look at. Will route to [place] to ensure follow up.

## 2015-01-02 ENCOUNTER — Other Ambulatory Visit: Payer: Self-pay | Admitting: Internal Medicine

## 2015-01-02 NOTE — Telephone Encounter (Signed)
Do not see anything for PA yet.

## 2015-01-03 ENCOUNTER — Encounter (HOSPITAL_COMMUNITY): Payer: Medicare Other

## 2015-01-03 NOTE — Telephone Encounter (Signed)
Robynn Panelise please advise if these forms have been faxed.  Thanks!

## 2015-01-04 NOTE — Telephone Encounter (Signed)
Received a new form from [employer] stating the previous form that was completed was not completed correctly. [employer] faxed a new form to be completed, will fax back form when completed with correct information. Will update chart when form has been faxed.   MR please advise what strength and frequency you would like for the Letaris. Thanks.

## 2015-01-04 NOTE — Telephone Encounter (Signed)
Miranda Hines - is this done?  Please advise.

## 2015-01-08 ENCOUNTER — Encounter (HOSPITAL_COMMUNITY): Payer: Medicare Other

## 2015-01-10 ENCOUNTER — Encounter (HOSPITAL_COMMUNITY): Payer: Medicare Other

## 2015-01-10 NOTE — Telephone Encounter (Signed)
Called and spoke with [place] at [facility] [phone]). She stated that [employer] wanted to find out the status of the forms that were to be completed. I explained to her that we received a form stating that the original was not completed correctly. I explained to her that MR was unavailable at this time but would return to the office on 01/16/15 and I asked her to refax the forms with attention Robynn PaneElise. Annice PihJackie also informed me that the case manager is Tyler AasDoris and her ext is [phone]. I informed Annice PihJackie once we received an answer from MR we would refax forms. She voiced understanding and had no further questions.  Will send to [facility] to follow up

## 2015-01-10 NOTE — Telephone Encounter (Signed)
[employer] call to check on status of forms, please advise.Caren GriffinsStanley A Dalton

## 2015-01-15 ENCOUNTER — Encounter (HOSPITAL_COMMUNITY): Payer: Medicare Other

## 2015-01-15 NOTE — Telephone Encounter (Signed)
On hold until MR returns to office on 01/16/15

## 2015-01-16 NOTE — Telephone Encounter (Signed)
Signed and given to [facility]

## 2015-01-16 NOTE — Telephone Encounter (Signed)
McQuaid prescribed the MotorolaLetairis. IF urgent he can fill the form too. Have Robynn Panelise give it to me 01/16/2015

## 2015-01-16 NOTE — Telephone Encounter (Signed)
MR is in office today and BQ is not.  Please advise when this form has been completed.  Thanks!

## 2015-01-17 ENCOUNTER — Encounter (HOSPITAL_COMMUNITY): Payer: Medicare Other

## 2015-01-17 NOTE — Telephone Encounter (Signed)
Completed form has been faxed. Will wait PA. Will sign off as phone encounter has been closed.

## 2015-01-18 ENCOUNTER — Telehealth: Payer: Self-pay | Admitting: Internal Medicine

## 2015-01-18 NOTE — Telephone Encounter (Signed)
Spoke with Tyler Aasoris. She reports she never received fax from December she sent to us. I advised per phone note 12/18/14 this was faxed yesterday.  she reports she never received this. I spoke with Robynn PaneElise and she is re faxing this form now.  Nothing further needed

## 2015-01-21 ENCOUNTER — Telehealth: Payer: Self-pay | Admitting: Internal Medicine

## 2015-01-21 NOTE — Telephone Encounter (Signed)
[facility], I advised that we have not yet received PA for Letairis.  This is being refaxed.  Verified fax #.  Will await fax.

## 2015-01-22 ENCOUNTER — Encounter (HOSPITAL_COMMUNITY): Payer: Medicare Other

## 2015-01-22 NOTE — Telephone Encounter (Signed)
Received PA form for Letaris. Completed PA form and faxed back to [employer]. Will await decision.

## 2015-01-24 ENCOUNTER — Encounter (HOSPITAL_COMMUNITY): Payer: Medicare Other

## 2015-01-25 NOTE — Telephone Encounter (Signed)
Have you received decision yet? Thanks.

## 2015-01-28 NOTE — Telephone Encounter (Signed)
[employer] and spoke with [facility]. She reports they are not showing any approval on file yet but also would not receive anything if this was denied. I called [employer] at number below. Was advised PA was never received. PA was started over the phone. Letairis 5mg  was approved unitl 07/28/15 reference # [id number]  Called [employer] back and spoke with Morrie SheldonAshley and made aware of approval. Nothing further needed

## 2015-01-28 NOTE — Telephone Encounter (Signed)
[employer] Phone # [phone] Pt [employer] ID: [id number] Insurance: Daiva HugeARP United Healthcare, [place] # [id number] PA phone #: [phone]  Misty, will you please help to check on the status of this within the next few days?  Thank you!

## 2015-01-29 ENCOUNTER — Telehealth: Payer: Self-pay | Admitting: Internal Medicine

## 2015-01-29 ENCOUNTER — Encounter (HOSPITAL_COMMUNITY): Payer: Medicare Other

## 2015-01-29 MED ORDER — FUROSEMIDE 40 MG PO TABS: 40.0000 mg | ORAL_TABLET | Freq: Every day | ORAL | Status: AC

## 2015-01-29 MED ORDER — TRAMADOL HCL 50 MG PO TABS: 50.0000 mg | ORAL_TABLET | Freq: Two times a day (BID) | ORAL | Status: DC | PRN

## 2015-01-29 MED ORDER — AMBRISENTAN 5 MG PO TABS: 5.0000 mg | ORAL_TABLET | Freq: Every day | ORAL | Status: AC

## 2015-01-29 MED ORDER — POTASSIUM CHLORIDE ER 10 MEQ PO TBCR: 10.0000 meq | EXTENDED_RELEASE_TABLET | Freq: Every day | ORAL | Status: DC

## 2015-01-29 NOTE — Telephone Encounter (Signed)
Spoke with pt, aware of recs.  Pt declined doppler, saying that "it isn't that bad and is improving", wants to give it a few days before she schedules any scan. rx's sent to preferred pharmacy.  Nothing further needed.

## 2015-01-29 NOTE — Telephone Encounter (Signed)
-   unsure if revatio is causing those cramps - it can cause swelling. Please get bilateral duplex LE - eval for DVT, indication: edema, cramps, thiggh pain  Ok to refill potassium, lasix, and tramadol

## 2015-01-29 NOTE — Telephone Encounter (Signed)
Spoke with pt, states that pt has had R leg pain described as muscle soreness in posterior thigh midway between knee and hip X3 days.  States it feels like she "pulled something while exercising".  Pt has been taking tylenol arthritis to help with s/s.  Pt wants to know if this is a side effect from the Revatio.  Because of this pt is requesting refill on Tramadol.  Pt also needs a refill on potassium and lasix.  Pt uses [facility] on [address].    MR please advise on recs.  Thanks.

## 2015-01-29 NOTE — Telephone Encounter (Signed)
Spoke with Marcelino Duster, pharmacist at [facility].  States that rx needed to be resent in, was  #30 for 30 day supply but did not have any refills on rx.  This has been sent.  Nothing further needed .

## 2015-01-31 ENCOUNTER — Encounter (HOSPITAL_COMMUNITY): Payer: Medicare Other

## 2015-01-31 ENCOUNTER — Telehealth: Payer: Self-pay | Admitting: Internal Medicine

## 2015-01-31 NOTE — Telephone Encounter (Signed)
Spoke with pt, states [facility] called her today to verify her Letairis prescription shipment-was told her copay would be over $2000/month. Pt was given a number to [employer], called the number she was given to follow up on copay assistance, was told she was not in the computer system. Called [employer], relayed this information-per Joni Reining at [employer] patient is in fact enrolled in system.  [employer] account # [id number].  States that the only two companies that pt can receive grant money is through [employer] (which has closed-has ran out of grant money) and [employer] telephone # [phone].    Relayed this info to pt, states she will apply for [employer] application.  Pt will update [place] if this does not work for her either.  Will close encounter.

## 2015-02-01 ENCOUNTER — Telehealth: Payer: Self-pay | Admitting: Internal Medicine

## 2015-02-01 NOTE — Telephone Encounter (Signed)
Spoke with pt. States that she was approved for a grant for her Letairis. They will be shipping her this medication on 02/05/15. Nothing further was needed.

## 2015-02-05 ENCOUNTER — Encounter (HOSPITAL_COMMUNITY): Payer: Medicare Other

## 2015-02-07 ENCOUNTER — Encounter (HOSPITAL_COMMUNITY): Payer: Medicare Other

## 2015-02-08 ENCOUNTER — Ambulatory Visit (INDEPENDENT_AMBULATORY_CARE_PROVIDER_SITE_OTHER): Payer: Medicare Other | Admitting: Internal Medicine

## 2015-02-08 ENCOUNTER — Encounter: Payer: Self-pay | Admitting: Internal Medicine

## 2015-02-08 VITALS — BP 132/58 | HR 51 | Ht 61.0 in | Wt 220.6 lb

## 2015-02-08 DIAGNOSIS — J9611 Chronic respiratory failure with hypoxia: Secondary | ICD-10-CM | POA: Diagnosis not present

## 2015-02-08 DIAGNOSIS — I272 Other secondary pulmonary hypertension: Secondary | ICD-10-CM | POA: Diagnosis not present

## 2015-02-08 DIAGNOSIS — J8410 Pulmonary fibrosis, unspecified: Secondary | ICD-10-CM

## 2015-02-08 NOTE — Patient Instructions (Signed)
ICD-9-CM ICD-10-CM   1. Chronic respiratory failure with hypoxia (HCC) 518.83 J96.11    799.02    2. 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 Pulmonary hypertension (HCC) 416.8 I27.2    Continue oxygen, revatio, prednisone and the new letairis Check your pusle ox when dizzy - if normal then talk to FULP, [name], MD about it Talk to FULP, [name], MD about muscle cramps I recommend extra layer of support at home - for now we agreed that family support   followup 2 months or sooner if needed

## 2015-02-08 NOTE — Progress Notes (Signed)
Subjective:     Patient ID: Miranda Hines, female   DOB: Apr 04, 1940, 75 y.o.   MRN: [id number]  HPI  OV 11/27/2014  Chief Complaint  Patient presents with  . Follow-up    Pt here for HFU for ILD. Pt states her breathing has improved since d/c. Pt c/o mild prod cough with cloudy mucus. Denies CP/tightness.    Follow-up chronic hypoxemic respiratory failure severe in the setting of interstitial lung disease not classified and associated for hypertension  11/16/2014 she saw Dr. Kendrick Hines for evaluation of pulmonary hypertension. But he was concerned about hospital-acquired pneumonia and she was admitted. It turns out clinically that she likely had a interstitial lung disease flareup because she come off prednisone. She did not respond anabiotic's in the hospital. She was reinitiated on steroids and started feeling better. She was also started on Lasix. Today she tells me that it is approximately 2 weeks since she started her revatio and she feels it is subjectively helped with her dyspnea. However objectively Walk test on 6L and 185 feet x 1/2 lap and ddeat to 70s - baseline similar to recent months. Overall she feels Lasix, oxygen, sildenafil or helping her situation. She did meet with palliative care in the hospital. She is not inclined towards transplantation (daughter Miranda Hines affirms). She is not inclined to with rapid weight loss to with transplantation. She is more interested in eating healthy. Her husband is sick with colon cancer and is requiring a lot of her attention. She is the main caregiver. She wants to hold off on pulmonary rehabilitation. Her current medication regimen includes prednisone which is on a taper, oxygen 4-6 liters, sildenafil. Subjectively she is better. She is interested in endothelin receptor antagonist for pulmonary hypertension. Overall she and her daughter aware of the poor prognosis from her chronic hypoxemic respiratory failure.     OV 02/08/2015  Chief  Complaint  Patient presents with  . Follow-up    Pt c/o cough that increases after using Duoneb. Pt denies wheeze/CP/tightness. Pt is on O2/5L continuous.    Follow-up chronic hypoxemic respiratory failure in the setting of interstitial lung disease not otherwise classified and associated pulmonary hypertension  Last seen November 2016. She is now on REvatio associated with prednisone. She is on oxygen 4 L-6 L nasal cannula. This has not helped. She easily desaturates to 70%. She presents with her daughter Miranda Hines who works as a Best boy at [employer]. He finally decided to add Letairis to this combination. She finally got the [place]  and started it only a few days ago. She's had some dizziness for the few days 3. This is slowly getting better. In addition she's having some muscle cramps. She continues to care for her husband with stroke despite her  desaturations. She has not lost any weight but for the last 2 weeks she's having low appetite. I offered her home health or hospice but she has declined. She prefers the support of her son.   Current outpatient prescriptions:  .  ambrisentan (LETAIRIS) 5 MG tablet, Take 1 tablet (5 mg total) by mouth daily., Disp: 30 tablet, Rfl: 5 .  aspirin 81 MG tablet, Take 81 mg by mouth daily., Disp: , Rfl:  .  famotidine (PEPCID) 20 MG tablet, Take 1 tablet (20 mg total) by mouth at bedtime., Disp: 90 tablet, Rfl: 3 .  fluticasone (FLONASE) 50 MCG/ACT nasal spray, Place 2 sprays into both nostrils daily., Disp: 16 g, Rfl: 0 .  furosemide (LASIX)  40 MG tablet, Take 1 tablet (40 mg total) by mouth daily., Disp: 30 tablet, Rfl: 3 .  guaiFENesin (MUCINEX) 600 MG 12 hr tablet, Take 1,200 mg by mouth as needed for congestion., Disp: , Rfl:  .  ipratropium-albuterol (DUONEB) 0.5-2.5 (3) MG/3ML SOLN, Take 3 mLs by nebulization 3 (three) times daily., Disp: 360 mL, Rfl: 12 .  pantoprazole (PROTONIX) 40 MG tablet, TAKE 1 TABLET BY MOUTH 30 TO 60 MINUTES BEFORE  FIRST MEAL OF THE DAY, Disp: 90 tablet, Rfl: 1 .  potassium chloride (K-DUR) 10 MEQ tablet, Take 1 tablet (10 mEq total) by mouth daily., Disp: 30 tablet, Rfl: 3 .  predniSONE (DELTASONE) 10 MG tablet, 60 mg daily x 7 days, then 50 mg daily and stay till you see your doctor., Disp: 150 tablet, Rfl: 1 .  Saline (SIMPLY SALINE) 0.9 % AERS, Place 1 spray into the nose daily as needed (allergies). , Disp: , Rfl:  .  sildenafil (REVATIO) 20 MG tablet, Take 1 tablet (20 mg total) by mouth 3 (three) times daily., Disp: 90 tablet, Rfl: 5 .  traMADol (ULTRAM) 50 MG tablet, Take 1 tablet (50 mg total) by mouth every 12 (twelve) hours as needed for moderate pain., Disp: 30 tablet, Rfl: 1   Review of Systems According to history of present illness    Objective:   Physical Exam  Constitutional: She is oriented to person, place, and time. She appears well-developed and well-nourished. No distress.  Morbidly obese Sitting in chair o2 on  HENT:  Head: Normocephalic and atraumatic.  Right Ear: External ear normal.  Left Ear: External ear normal.  Mouth/Throat: Oropharynx is clear and moist. No oropharyngeal exudate.  Eyes: Conjunctivae and EOM are normal. Pupils are equal, round, and reactive to light. Right eye exhibits no discharge. Left eye exhibits no discharge. No scleral icterus.  Neck: Normal range of motion. Neck supple. No JVD present. No tracheal deviation present. No thyromegaly present.  Cardiovascular: Normal rate, regular rhythm, normal heart sounds and intact distal pulses.  Exam reveals no gallop and no friction rub.   No murmur heard. Pulmonary/Chest: Effort normal. No respiratory distress. She has no wheezes. She has rales. She exhibits no tenderness.  Abdominal: Soft. Bowel sounds are normal. She exhibits no distension and no mass. There is no tenderness. There is no rebound and no guarding.  Musculoskeletal: Normal range of motion. She exhibits edema. She exhibits no tenderness.   Lymphadenopathy:    She has no cervical adenopathy.  Neurological: She is alert and oriented to person, place, and time. She has normal reflexes. No cranial nerve deficit. She exhibits normal muscle tone. Coordination normal.  Skin: Skin is warm and dry. No rash noted. She is not diaphoretic. No erythema. No pallor.  Easy bruusing of skin  Psychiatric: She has a normal mood and affect. Her behavior is normal. Judgment and thought content normal.  Vitals reviewed.    Filed Vitals:   02/08/15 1601  BP: 132/58  Pulse: 51  Height:  (1.549 m)  Weight: 220 lb 9.6 oz (100.064 kg)  SpO2: 92%       Assessment:       ICD-9-CM ICD-10-CM   1. Chronic respiratory failure with hypoxia (HCC) 518.83 J96.11    799.02    2. 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 Pulmonary hypertension (HCC) 416.8 I27.2         Plan:      Continue oxygen, revatio, prednisone and the new  letairis Check your pusle ox when dizzy - if normal then talk to FULP, [name], MD about it Talk to FULP, [name], MD about muscle cramps I recommend extra layer of support at home - for now we agreed that family support  (offered hospice or even visiting RN and she declined)  followup 2 months or sooner if needed  > 50% of this > 25 min visit spent in face to face counseling or coordination of care     Dr. Kalman Shan, M.D., [facility].C.P Pulmonary and Critical Care Medicine Staff Physician [facility] Pager: [phone], If no answer or between  15:00h - 7:00h: call [phone]  02/08/2015 4:53 PM

## 2015-02-12 ENCOUNTER — Encounter (HOSPITAL_COMMUNITY): Payer: Medicare Other

## 2015-02-13 ENCOUNTER — Telehealth: Payer: Self-pay | Admitting: Internal Medicine

## 2015-02-13 MED ORDER — PANTOPRAZOLE SODIUM 40 MG PO TBEC: DELAYED_RELEASE_TABLET | ORAL | Status: AC

## 2015-02-13 MED ORDER — FLUTICASONE PROPIONATE 50 MCG/ACT NA SUSP: 2.0000 | Freq: Every day | NASAL | Status: AC

## 2015-02-13 NOTE — Telephone Encounter (Signed)
Called and spoke to pt. Pt requested flonase and protonix refills at last OV on 1.27.17. These were both ok'ed by MR at time of OV. Both rx's sent to preferred pharmacy. Pt verbalized understanding and denied any further questions or concerns at this time.

## 2015-02-14 ENCOUNTER — Encounter (HOSPITAL_COMMUNITY): Payer: Medicare Other

## 2015-03-18 ENCOUNTER — Telehealth: Payer: Self-pay | Admitting: Internal Medicine

## 2015-03-18 MED ORDER — PREDNISONE 10 MG PO TABS: ORAL_TABLET | ORAL | Status: DC

## 2015-03-18 NOTE — Telephone Encounter (Signed)
Patient notified of Dr. Jane Canaryamaswamy's recommendations. Rx sent to pharmacy. Patient has f/u appointment. Nothing further needed.

## 2015-03-18 NOTE — Telephone Encounter (Signed)
Patient requesting refill of Prednisone - patient states that with taper she was prescribed 11/2014 she has been taking 30mg  as a base dose since.  Pt states that her breathing has been doing great on 30mg  but feels that she may be able to taper down further now. Pt states that her O2 sats are great during the day but when she lays down at night (on her back) she notices her O2 levels dipping to 70's/80's but quickly recover to 90's after a few minutes of laying still.   PLAN  11/2014 - continue oxygen - 4-6L [place] - aim to keep pulse ox > 75% - ideally > 85% - hold off on rehab -due to several medical and social and logisitcail issues - respect lack of interest in transplant - for healthy diet - take low glycemic diet sheet - for ILD - continue prednisone 50mg  per day and then in a week down to 40mg  per day and then a week later down to 30mg  per day and continue at that dose before we reases further taper - for pulmonary hypertension - cotninue revatio - glad you feel is helping - wil ask Dr Kendrick FriesMcQuaid nurse to initiate Process for Letairis  Followup - 6-8 weeks with Dr Marchelle Gearingamaswamy or sooner if needed   Please advise Dr Marchelle Gearingamaswamy if you want the patient to remain on current 30mg  or taper down. If tapering, please advise on new Rx as she is out of medication and needs this refilled today. Thanks.

## 2015-03-18 NOTE — Telephone Encounter (Signed)
Refill prednisone at 30mg  per day. Ensure she has fu OV

## 2015-03-29 ENCOUNTER — Telehealth: Payer: Self-pay | Admitting: Internal Medicine

## 2015-03-29 MED ORDER — TRAMADOL HCL 50 MG PO TABS: 50.0000 mg | ORAL_TABLET | Freq: Three times a day (TID) | ORAL | Status: DC | PRN

## 2015-03-29 NOTE — Telephone Encounter (Signed)
Ok for tramadol 50mg  q8h prn x 10 days - no refill

## 2015-03-29 NOTE — Telephone Encounter (Signed)
Called spoke with pt. Aware of recs below. RX will be called into [facility] per pt request.

## 2015-03-29 NOTE — Telephone Encounter (Signed)
Called spoke with pt. She was given tramadol back in January from MR for some back pain she had from her hospital stay in November. She reports her back did get better but then last night she picked up the trash can and felt like she pulled something. She called PCP but they will not refill it until she is seen on Wednesday. Pt is asking if MR will refill this for her as she is in a lot of pain for BID until Wednesday. Please advise MR thanks

## 2015-04-13 DIAGNOSIS — J9691 Respiratory failure, unspecified with hypoxia: Secondary | ICD-10-CM

## 2015-04-13 DIAGNOSIS — IMO0002 Reserved for concepts with insufficient information to code with codable children: Secondary | ICD-10-CM

## 2015-04-13 HISTORY — DX: Respiratory failure, unspecified with hypoxia: J96.91

## 2015-04-13 HISTORY — DX: Reserved for concepts with insufficient information to code with codable children: IMO0002

## 2015-04-15 ENCOUNTER — Ambulatory Visit: Payer: Medicare Other | Admitting: Internal Medicine

## 2015-04-19 ENCOUNTER — Telehealth: Payer: Self-pay | Admitting: Internal Medicine

## 2015-04-19 NOTE — Telephone Encounter (Signed)
Spoke with pt's daughter and she states that pt has been having back pain for several weeks. [facility] and was found to have several broken vertebra in her back. Pt is scheduled to have MRI and possible surgery. Daughter just wanted to let you know. Pt is scheduled for follow up with you in May.   Routed to [place] and MR for [employer].

## 2015-04-22 ENCOUNTER — Other Ambulatory Visit: Payer: Self-pay | Admitting: Orthopaedic Surgery

## 2015-04-22 DIAGNOSIS — M545 Low back pain: Secondary | ICD-10-CM

## 2015-04-23 ENCOUNTER — Ambulatory Visit
Admission: RE | Admit: 2015-04-23 | Discharge: 2015-04-23 | Disposition: A | Discharge status: Home / Self Care | Payer: Medicare Other | Source: Ambulatory Visit | Attending: Orthopaedic Surgery | Admitting: Orthopaedic Surgery

## 2015-04-23 DIAGNOSIS — M545 Low back pain: Secondary | ICD-10-CM

## 2015-04-28 NOTE — Telephone Encounter (Signed)
Surgery under anesthesia can be risky. Who is the ortho surgeon she is going to see? Thanks for infor

## 2015-04-29 NOTE — Telephone Encounter (Signed)
Called [facility] and spoke with Johnny BridgeMartha- Dr. Estill BambergMark Dumonski was seen 4.15.17, post op appt is scheduled for 5.19.17 but actual surgery date has not yet been set.    Was able to speak with pt's daughter Babette Relicammy who verified the above and reported that per the 4.15.17 ov with Dr [name] there are 2 options: 1: brace, 2: surgery.  Surgery is more risky and pt would have to be put to sleep.  They're opting for the brace.  Note to be sent to MR.  Patient called [employer] this morning to go get fitted for the brace.  Tammy asked Dr [name] about the compression of the brace and if it would affect her breathing - was told they'll just have to wait and see.  Pt is scheduled for follow up with MR on 5.5.17 and will keep this appt Will [facility] back [facility]

## 2015-05-06 ENCOUNTER — Telehealth: Payer: Self-pay | Admitting: Internal Medicine

## 2015-05-06 NOTE — Telephone Encounter (Signed)
Prednisone 10 mg take  4 each am x 2 days,   2 each am x 2 days,  1 each am x 2 days and stop  

## 2015-05-06 NOTE — Telephone Encounter (Signed)
ATC x2, line busy.  WCB.

## 2015-05-06 NOTE — Telephone Encounter (Signed)
Called spoke with patient and discussed MW's recs as stated below.  Pt stated that she is already taking 30mg  prednisone daily.  Offered OV with Sarah tomorrow afternoon but pt declined stating that she will increase her prednisone to 40mg  for a "couple of days and see how she feels."  Advised pt that ov would be the best course of action given her symptoms and current med regimen but she declined again.  Pt is aware to contact the office if her symptoms do not improve or they worsen.  Nothing further needed at this time; will sign off.

## 2015-05-06 NOTE — Telephone Encounter (Signed)
Spoke with pt and she states that she has a lot of pnd, sinus congestion with clear mucus, eye irritation/itching, cough from drainage and some chills. Pt denies wheeze/SOB/CP/tightness/fever/n/v. Pt would like to know what you recommend for these symptoms.   MR is on night float. [facility].  MW Please advise. Thank you!  Plan:      Continue oxygen, revatio, prednisone and the new letairis Check your pusle ox when dizzy - if normal then talk to FULP, [name], MD about it Talk to FULP, [name], MD about muscle cramps I recommend extra layer of support at home - for now we agreed that family support (offered hospice or even visiting RN and she declined)  followup 2 months or sooner if needed

## 2015-05-08 ENCOUNTER — Inpatient Hospital Stay (HOSPITAL_COMMUNITY)
Admission: EM | Admit: 2015-05-08 | Discharge: 2015-05-17 | DRG: 291 | Disposition: A | Discharge status: Hospice | Payer: Medicare Other | Attending: Internal Medicine | Admitting: Internal Medicine

## 2015-05-08 ENCOUNTER — Emergency Department (HOSPITAL_COMMUNITY): Payer: Medicare Other

## 2015-05-08 ENCOUNTER — Telehealth: Payer: Self-pay | Admitting: Internal Medicine

## 2015-05-08 ENCOUNTER — Encounter (HOSPITAL_COMMUNITY): Payer: Self-pay | Admitting: Emergency Medicine

## 2015-05-08 DIAGNOSIS — Z515 Encounter for palliative care: Secondary | ICD-10-CM | POA: Insufficient documentation

## 2015-05-08 DIAGNOSIS — Z66 Do not resuscitate: Secondary | ICD-10-CM | POA: Diagnosis present

## 2015-05-08 DIAGNOSIS — E873 Alkalosis: Secondary | ICD-10-CM | POA: Diagnosis present

## 2015-05-08 DIAGNOSIS — D649 Anemia, unspecified: Secondary | ICD-10-CM | POA: Diagnosis not present

## 2015-05-08 DIAGNOSIS — Z79899 Other long term (current) drug therapy: Secondary | ICD-10-CM

## 2015-05-08 DIAGNOSIS — M4854XA Collapsed vertebra, not elsewhere classified, thoracic region, initial encounter for fracture: Secondary | ICD-10-CM | POA: Diagnosis present

## 2015-05-08 DIAGNOSIS — J9801 Acute bronchospasm: Secondary | ICD-10-CM | POA: Diagnosis present

## 2015-05-08 DIAGNOSIS — R52 Pain, unspecified: Secondary | ICD-10-CM

## 2015-05-08 DIAGNOSIS — K219 Gastro-esophageal reflux disease without esophagitis: Secondary | ICD-10-CM | POA: Diagnosis present

## 2015-05-08 DIAGNOSIS — J84112 Idiopathic pulmonary fibrosis: Secondary | ICD-10-CM | POA: Diagnosis present

## 2015-05-08 DIAGNOSIS — I272 Other secondary pulmonary hypertension: Secondary | ICD-10-CM | POA: Diagnosis present

## 2015-05-08 DIAGNOSIS — Z7982 Long term (current) use of aspirin: Secondary | ICD-10-CM | POA: Diagnosis not present

## 2015-05-08 DIAGNOSIS — M5489 Other dorsalgia: Secondary | ICD-10-CM

## 2015-05-08 DIAGNOSIS — J849 Interstitial pulmonary disease, unspecified: Secondary | ICD-10-CM | POA: Diagnosis present

## 2015-05-08 DIAGNOSIS — Z880 Allergy status to penicillin: Secondary | ICD-10-CM | POA: Diagnosis not present

## 2015-05-08 DIAGNOSIS — M4856XA Collapsed vertebra, not elsewhere classified, lumbar region, initial encounter for fracture: Secondary | ICD-10-CM | POA: Diagnosis present

## 2015-05-08 DIAGNOSIS — Z789 Other specified health status: Secondary | ICD-10-CM | POA: Diagnosis not present

## 2015-05-08 DIAGNOSIS — J9600 Acute respiratory failure, unspecified whether with hypoxia or hypercapnia: Secondary | ICD-10-CM | POA: Diagnosis present

## 2015-05-08 DIAGNOSIS — R0600 Dyspnea, unspecified: Secondary | ICD-10-CM | POA: Diagnosis not present

## 2015-05-08 DIAGNOSIS — I2609 Other pulmonary embolism with acute cor pulmonale: Secondary | ICD-10-CM | POA: Diagnosis not present

## 2015-05-08 DIAGNOSIS — I5033 Acute on chronic diastolic (congestive) heart failure: Secondary | ICD-10-CM | POA: Diagnosis not present

## 2015-05-08 DIAGNOSIS — R0602 Shortness of breath: Secondary | ICD-10-CM

## 2015-05-08 DIAGNOSIS — Z7952 Long term (current) use of systemic steroids: Secondary | ICD-10-CM

## 2015-05-08 DIAGNOSIS — J9612 Chronic respiratory failure with hypercapnia: Secondary | ICD-10-CM | POA: Diagnosis present

## 2015-05-08 DIAGNOSIS — J8410 Pulmonary fibrosis, unspecified: Secondary | ICD-10-CM | POA: Diagnosis present

## 2015-05-08 DIAGNOSIS — Z9981 Dependence on supplemental oxygen: Secondary | ICD-10-CM | POA: Diagnosis not present

## 2015-05-08 DIAGNOSIS — Z9114 Patient's other noncompliance with medication regimen: Secondary | ICD-10-CM | POA: Diagnosis not present

## 2015-05-08 DIAGNOSIS — R410 Disorientation, unspecified: Secondary | ICD-10-CM | POA: Diagnosis present

## 2015-05-08 DIAGNOSIS — F419 Anxiety disorder, unspecified: Secondary | ICD-10-CM | POA: Diagnosis present

## 2015-05-08 DIAGNOSIS — Z7722 Contact with and (suspected) exposure to environmental tobacco smoke (acute) (chronic): Secondary | ICD-10-CM | POA: Diagnosis present

## 2015-05-08 DIAGNOSIS — J9621 Acute and chronic respiratory failure with hypoxia: Secondary | ICD-10-CM

## 2015-05-08 DIAGNOSIS — Z09 Encounter for follow-up examination after completed treatment for conditions other than malignant neoplasm: Secondary | ICD-10-CM

## 2015-05-08 DIAGNOSIS — J9601 Acute respiratory failure with hypoxia: Secondary | ICD-10-CM | POA: Diagnosis not present

## 2015-05-08 DIAGNOSIS — I2781 Cor pulmonale (chronic): Secondary | ICD-10-CM | POA: Diagnosis present

## 2015-05-08 DIAGNOSIS — J811 Chronic pulmonary edema: Secondary | ICD-10-CM

## 2015-05-08 DIAGNOSIS — M549 Dorsalgia, unspecified: Secondary | ICD-10-CM | POA: Diagnosis not present

## 2015-05-08 DIAGNOSIS — S32010A Wedge compression fracture of first lumbar vertebra, initial encounter for closed fracture: Secondary | ICD-10-CM

## 2015-05-08 HISTORY — DX: Reserved for concepts with insufficient information to code with codable children: IMO0002

## 2015-05-08 HISTORY — DX: Respiratory failure, unspecified with hypoxia: J96.91

## 2015-05-08 HISTORY — DX: Chronic respiratory failure with hypoxia: J96.11

## 2015-05-08 HISTORY — DX: Pulmonary hypertension, unspecified: I27.20

## 2015-05-08 LAB — BASIC METABOLIC PANEL
Anion gap: 11 (ref 5–15)
BUN: 18 mg/dL (ref 6–20)
CHLORIDE: 95 mmol/L — AB (ref 101–111)
CO2: 36 mmol/L — ABNORMAL HIGH (ref 22–32)
Calcium: 8.9 mg/dL (ref 8.9–10.3)
Creatinine, Ser: 0.56 mg/dL (ref 0.44–1.00)
GFR calc non Af Amer: >60 mL/min (ref >60)
Glucose, Bld: 90 mg/dL (ref 65–99)
POTASSIUM: 4 mmol/L (ref 3.5–5.1)
SODIUM: 142 mmol/L (ref 135–145)

## 2015-05-08 LAB — CBC
HCT: 34 % — ABNORMAL LOW (ref 36.0–46.0)
HEMOGLOBIN: 10.6 g/dL — AB (ref 12.0–15.0)
MCH: 31.1 pg (ref 26.0–34.0)
MCHC: 31.2 g/dL (ref 30.0–36.0)
MCV: 99.7 fL (ref 78.0–100.0)
Platelets: 149 10*3/uL — ABNORMAL LOW (ref 150–400)
RBC: 3.41 MIL/uL — AB (ref 3.87–5.11)
RDW: 17.3 % — ABNORMAL HIGH (ref 11.5–15.5)
WBC: 7.6 10*3/uL (ref 4.0–10.5)

## 2015-05-08 LAB — I-STAT TROPONIN, ED: TROPONIN I, POC: 0.01 ng/mL (ref 0.00–0.08)

## 2015-05-08 LAB — BRAIN NATRIURETIC PEPTIDE: B NATRIURETIC PEPTIDE 5: 64.9 pg/mL (ref 0.0–100.0)

## 2015-05-08 MED ORDER — ONDANSETRON HCL 4 MG/2ML IJ SOLN
4.0000 mg | Freq: Once | INTRAMUSCULAR | Status: DC
Start: 2015-05-08 — End: 2015-05-08

## 2015-05-08 MED ORDER — PANTOPRAZOLE SODIUM 40 MG PO TBEC
40.0000 mg | DELAYED_RELEASE_TABLET | Freq: Every day | ORAL | Status: DC
  Administered 2015-05-08 – 2015-05-17 (×10): 40 mg via ORAL
  Filled 2015-05-08 (×10): qty 1

## 2015-05-08 MED ORDER — PREDNISONE 10 MG PO TABS
10.0000 mg | ORAL_TABLET | Freq: Every day | ORAL | Status: DC
  Administered 2015-05-09 – 2015-05-10 (×2): 10 mg via ORAL
  Filled 2015-05-08 (×2): qty 1

## 2015-05-08 MED ORDER — IBUPROFEN 600 MG PO TABS: 600.0000 mg | ORAL_TABLET | Freq: Four times a day (QID) | ORAL | Status: DC | PRN

## 2015-05-08 MED ORDER — ALPRAZOLAM 0.25 MG PO TABS
0.2500 mg | ORAL_TABLET | ORAL | Status: DC | PRN
Start: 2015-05-08 — End: 2015-05-13
  Administered 2015-05-09 – 2015-05-13 (×10): 0.25 mg via ORAL
  Filled 2015-05-08 (×13): qty 1

## 2015-05-08 MED ORDER — AMBRISENTAN 5 MG PO TABS
5.0000 mg | ORAL_TABLET | Freq: Every day | ORAL | Status: DC
  Administered 2015-05-08 – 2015-05-16 (×9): 5 mg via ORAL
  Filled 2015-05-08 (×9): qty 1

## 2015-05-08 MED ORDER — FUROSEMIDE 10 MG/ML IJ SOLN
40.0000 mg | Freq: Once | INTRAMUSCULAR | Status: AC
  Administered 2015-05-08: 40 mg via INTRAVENOUS
  Filled 2015-05-08: qty 4

## 2015-05-08 MED ORDER — GUAIFENESIN ER 600 MG PO TB12: 1200.0000 mg | ORAL_TABLET | Freq: Two times a day (BID) | ORAL | Status: DC | PRN

## 2015-05-08 MED ORDER — MORPHINE SULFATE (PF) 4 MG/ML IV SOLN
4.0000 mg | Freq: Once | INTRAVENOUS | Status: AC
  Administered 2015-05-08: 4 mg via INTRAVENOUS
  Filled 2015-05-08: qty 1

## 2015-05-08 MED ORDER — IPRATROPIUM BROMIDE 0.02 % IN SOLN
0.5000 mg | Freq: Once | RESPIRATORY_TRACT | Status: AC
  Administered 2015-05-08: 0.5 mg via RESPIRATORY_TRACT
  Filled 2015-05-08: qty 2.5

## 2015-05-08 MED ORDER — IPRATROPIUM-ALBUTEROL 0.5-2.5 (3) MG/3ML IN SOLN
3.0000 mL | RESPIRATORY_TRACT | Status: DC | PRN
  Administered 2015-05-10 – 2015-05-13 (×6): 3 mL via RESPIRATORY_TRACT
  Filled 2015-05-08 (×6): qty 3

## 2015-05-08 MED ORDER — HYDROMORPHONE HCL 1 MG/ML IJ SOLN
0.5000 mg | INTRAMUSCULAR | Status: DC | PRN
  Administered 2015-05-08 – 2015-05-10 (×6): 1 mg via INTRAVENOUS
  Filled 2015-05-08 (×6): qty 1

## 2015-05-08 MED ORDER — ASPIRIN 81 MG PO TABS: 81.0000 mg | ORAL_TABLET | Freq: Every day | ORAL | Status: DC

## 2015-05-08 MED ORDER — TRAMADOL HCL 50 MG PO TABS
50.0000 mg | ORAL_TABLET | Freq: Three times a day (TID) | ORAL | Status: DC | PRN
Start: 2015-05-08 — End: 2015-05-13
  Administered 2015-05-10 – 2015-05-13 (×6): 50 mg via ORAL
  Filled 2015-05-08 (×6): qty 1

## 2015-05-08 MED ORDER — LEVOFLOXACIN IN D5W 750 MG/150ML IV SOLN
750.0000 mg | INTRAVENOUS | Status: DC
  Administered 2015-05-08: 750 mg via INTRAVENOUS
  Filled 2015-05-08: qty 150

## 2015-05-08 MED ORDER — METHYLPREDNISOLONE SODIUM SUCC 125 MG IJ SOLR
125.0000 mg | Freq: Once | INTRAMUSCULAR | Status: AC
  Administered 2015-05-08: 125 mg via INTRAVENOUS
  Filled 2015-05-08: qty 2

## 2015-05-08 MED ORDER — ALBUTEROL SULFATE (2.5 MG/3ML) 0.083% IN NEBU
5.0000 mg | INHALATION_SOLUTION | Freq: Once | RESPIRATORY_TRACT | Status: DC
  Filled 2015-05-08: qty 6

## 2015-05-08 MED ORDER — SILDENAFIL CITRATE 20 MG PO TABS
20.0000 mg | ORAL_TABLET | Freq: Three times a day (TID) | ORAL | Status: DC
  Administered 2015-05-08 – 2015-05-17 (×26): 20 mg via ORAL
  Filled 2015-05-08 (×29): qty 1

## 2015-05-08 MED ORDER — HYDROCODONE-ACETAMINOPHEN 5-325 MG PO TABS
1.0000 | ORAL_TABLET | Freq: Four times a day (QID) | ORAL | Status: DC | PRN
  Administered 2015-05-11 – 2015-05-13 (×5): 1 via ORAL
  Filled 2015-05-08 (×5): qty 1

## 2015-05-08 MED ORDER — ENOXAPARIN SODIUM 40 MG/0.4ML ~~LOC~~ SOLN
40.0000 mg | SUBCUTANEOUS | Status: DC
  Administered 2015-05-08 – 2015-05-15 (×8): 40 mg via SUBCUTANEOUS
  Filled 2015-05-08 (×8): qty 0.4

## 2015-05-08 MED ORDER — SODIUM CHLORIDE 0.9 % IV SOLN: INTRAVENOUS | Status: DC | PRN

## 2015-05-08 MED ORDER — ALBUTEROL SULFATE (2.5 MG/3ML) 0.083% IN NEBU
5.0000 mg | INHALATION_SOLUTION | Freq: Once | RESPIRATORY_TRACT | Status: AC
  Administered 2015-05-08: 5 mg via RESPIRATORY_TRACT
  Filled 2015-05-08: qty 6

## 2015-05-08 MED ORDER — TIZANIDINE HCL 2 MG PO TABS: 2.0000 mg | ORAL_TABLET | Freq: Three times a day (TID) | ORAL | Status: DC | PRN

## 2015-05-08 MED ORDER — ASPIRIN 81 MG PO CHEW
81.0000 mg | CHEWABLE_TABLET | Freq: Every day | ORAL | Status: DC
  Administered 2015-05-08 – 2015-05-17 (×10): 81 mg via ORAL
  Filled 2015-05-08 (×10): qty 1

## 2015-05-08 MED ORDER — FLUTICASONE PROPIONATE 50 MCG/ACT NA SUSP
2.0000 | Freq: Every day | NASAL | Status: DC
  Administered 2015-05-08 – 2015-05-17 (×10): 2 via NASAL
  Filled 2015-05-08 (×3): qty 16

## 2015-05-08 MED ORDER — FAMOTIDINE 20 MG PO TABS
20.0000 mg | ORAL_TABLET | Freq: Every day | ORAL | Status: DC
  Administered 2015-05-08 – 2015-05-16 (×9): 20 mg via ORAL
  Filled 2015-05-08 (×9): qty 1

## 2015-05-08 NOTE — H&P (Signed)
PULMONARY / CRITICAL CARE MEDICINE   Name: Miranda Hines MRN: [id number] DOB: [birth year]    ADMISSION DATE:  05/08/2015  REFERRING MD:  ER  CHIEF COMPLAINT:  Short of breath  HISTORY OF PRESENT ILLNESS:   76 yo female with hx of ILD and pulmonary hypertension has progressive shortness of breath.  She has been followed by Dr. Yevette Edwards with orthopedics for T12 and L1 compression fractures.  She was placed in a brace that she is to wear during the day.  This makes her breathing extremely difficulty, and she feels like she can't take a deep breath in.  She also has not been taking her lasix recently because it is too painful for her to walk to go the bathroom.  She had prednisone dose increased recently as outpt and this didn't help.  She has been given neb tx w/o any difference.  She normally wears 3 to 4 liters oxygen at home.  She feels her breathing is okay when she is at rest and not wearing back brace.  She denies chest pain, sputum, fever, nausea, or abdominal pain.  Her leg swelling has gotten much worse.  PAST MEDICAL HISTORY :  She  has a past medical history of Interstitial lung disease (HCC) and IPF (idiopathic pulmonary fibrosis) (HCC).  PAST SURGICAL HISTORY: She  has past surgical history that includes Appendectomy; Tubal ligation; and Cardiac catheterization (N/A, 11/08/2014).  Allergies  Allergen Reactions  . Penicillins Swelling    No current facility-administered medications on file prior to encounter.   Current Outpatient Prescriptions on File Prior to Encounter  Medication Sig  . ambrisentan (LETAIRIS) 5 MG tablet Take 1 tablet (5 mg total) by mouth daily.  Marland Kitchen aspirin 81 MG tablet Take 81 mg by mouth daily.  . famotidine (PEPCID) 20 MG tablet Take 1 tablet (20 mg total) by mouth at bedtime.  . fluticasone (FLONASE) 50 MCG/ACT nasal spray Place 2 sprays into both nostrils daily.  . furosemide (LASIX) 40 MG tablet Take 1 tablet (40 mg total) by mouth daily.  Marland Kitchen  guaiFENesin (MUCINEX) 600 MG 12 hr tablet Take 1,200 mg by mouth as needed for congestion.  Marland Kitchen ipratropium-albuterol (DUONEB) 0.5-2.5 (3) MG/3ML SOLN Take 3 mLs by nebulization 3 (three) times daily.  . pantoprazole (PROTONIX) 40 MG tablet TAKE 1 TABLET BY MOUTH 30 TO 60 MINUTES BEFORE FIRST MEAL OF THE DAY  . potassium chloride (K-DUR) 10 MEQ tablet Take 1 tablet (10 mEq total) by mouth daily.  . predniSONE (DELTASONE) 10 MG tablet 30 mg tablets daily (Patient taking differently: Take 30 mg by mouth daily. 30 mg tablets daily)  . Saline (SIMPLY SALINE) 0.9 % AERS Place 1 spray into the nose daily as needed (allergies).   . sildenafil (REVATIO) 20 MG tablet Take 1 tablet (20 mg total) by mouth 3 (three) times daily.  . traMADol (ULTRAM) 50 MG tablet Take 1 tablet (50 mg total) by mouth every 8 (eight) hours as needed for moderate pain. (Patient not taking: Reported on 05/08/2015)    FAMILY HISTORY:  Her indicated that her mother is deceased. She indicated that her father is deceased. She indicated that all of her three sisters are alive. She indicated that both of her brothers are alive.   SOCIAL HISTORY: She  reports that she has been passively smoking.  She does not have any smokeless tobacco history on file. She reports that she does not drink alcohol or use illicit drugs.  REVIEW OF SYSTEMS:  Negative except above  SUBJECTIVE:  C/o severe back pain with any movement.  VITAL SIGNS: BP 149/75 mmHg  Pulse 111  Temp(Src) 98 F (36.7 C) (Oral)  Resp 23  Ht 5\' 2"  (1.575 m)  Wt 195 lb (88.451 kg)  BMI 35.66 kg/m2  SpO2 92%  INTAKE / OUTPUT:    PHYSICAL EXAMINATION: General:  Seen in ER room Neuro:  Alert, normal strength, moves all extremities, CN intact HEENT:  Pupils reactive, moon facies, no stridor, speaks in full sentences  Cardiovascular:  Regular, tachycardic, no murmur Lungs:  B/l crackles, no wheeze Abdomen:  Soft, non tender, +bowel sounds Musculoskeletal:  3+  edema Skin:  No rashes  LABS:  BMET  Recent Labs Lab 05/08/15 0825  NA 142  K 4.0  CL 95*  CO2 36*  BUN 18  CREATININE 0.56  GLUCOSE 90    Electrolytes  Recent Labs Lab 05/08/15 0825  CALCIUM 8.9    CBC  Recent Labs Lab 05/08/15 0825  WBC 7.6  HGB 10.6*  HCT 34.0*  PLT 149*    Imaging Dg Chest Port 1 View  05/08/2015  CLINICAL DATA:  75 year old female with a history of increasing shortness of breath EXAM: PORTABLE CHEST 1 VIEW COMPARISON:  Plain film 11/19/2014, CT 11/15/2014 FINDINGS: Limited plain film given patient positioning and overlying soft tissues of the chest wall/ abdominal wall. Cardiomediastinal silhouette likely unchanged. No visualized pneumothorax. Mixed interstitial and airspace opacities, improved from the plain film of 11/19/2014. IMPRESSION: Limited plain film, with overall improved aeration from the comparison 11/19/2014. Mixed interstitial and airspace opacities bilaterally represents background of patient's known chronic interstitial disease, with superimposed pneumonitis/ pneumonia difficult to exclude. If there is concern for more sensitive/specific evaluation, a repeat formal PA and lateral chest x-ray may be considered. Signed, Yvone NeuJaime S. Loreta AveWagner, DO [facility] Electronically Signed   By: Gilmer MorJaime  Wagner D.O.   On: 05/08/2015 08:43     STUDIES:  12/13/12 PFT >> FEV1 0.91, (44%), FEV1% 99, TLC 2.18 (45%), DLCO 42% 10/21/14 Echo >> EF 60 to 65%, mild LVH, grade 1 diastolic CHF 11/08/14 RHC >> RA 4, RV 40/1, PA 45/19 mean 32, PCWP 8, CI 2.15, PVR 6.5 WU 11/15/14 HRCT chest >> patchy GGO, btx with random distribution, air trapping 04/23/15 MRI lumbar spine >> mod T12 and severe L1 compression fx  SIGNIFICANT EVENTS: 4/26 Admit  DISCUSSION: 75 yo female with progressive dyspnea related to severe back pain with lung restriction from wearing brace in setting of ILD.  She also has  hypervolemia related to inability to take outpt lasix, due to inability to go to the bathroom because of severe back pain.  ASSESSMENT / PLAN:  Dyspnea. ILD >> process undefined; negative serology from October 2016. Acute on chronic hypoxic respiratory failure. Pulmonary hypertension. Chronic diastolic CHF. - oxygen to keep SpO2 90 to 95% - prn duonebs - lasix 40 mg IV daily - change prednisone to 10 mg daily - continue revatio, letaris  T12, L1 compression fx's. - will ask orthopedics to assess while she is in hospital - will need to adjust pain medication regimen while in hospital  Hx of GERD. - continue protonix, pepcid  Goals of care >> Full Code DVT prophylaxis >> Lovenox  Updated pt's daughter at bedside.  Will ask [facility] to assume care from 4/27 >> PCCM will f/u as pulmonary consult.  Coralyn HellingVineet Ledonna Dormer, MD [facility] 05/08/2015, 1:19 PM Pager:  [phone] After 3pm call:  [phone]  

## 2015-05-08 NOTE — Telephone Encounter (Signed)
Which bed? Which hospital? I am on nights and can see if I can swing by. They should ask the admitting doctor to page PCCM [phone] for pulmonary consult if needed

## 2015-05-08 NOTE — ED Notes (Signed)
Dr. Craige CottaSood, MD at bedside.

## 2015-05-08 NOTE — Care Management (Signed)
ED CM met with patient and family at bedside to discuss discharge planning, patient currently being transferred to 5W, CM explained to patient that unit CM will follow up for discharge planning in the am

## 2015-05-08 NOTE — Progress Notes (Signed)
Pharmacy Consult - Lovenox for VTE prophylaxis  74 yof admitted with SOB. Pharmacy consulted to dose lovenox for VTE prophylaxis. No AC pta. Hg 10.6, plt 149, no bleed documented. Wt~89 kg, CrCl>30.  Plan: Lovenox 40mg   q24h Monitor CBC, s/sx bleeding Rx will sign off consult  Babs BertinHaley Wister Hoefle, PharmD, BCPS Clinical Pharmacist Pager [phone] 05/08/2015 1:24 PM

## 2015-05-08 NOTE — Progress Notes (Signed)
Spoke with Dr. [name] via phone - he recommends brace be utilized when patient is up moving around at all times.  Also, she will need an elective kypoplasty once medically stable.  Please refer to Dr. Evlyn CourierSood's note for further details regarding plan of care.    Canary BrimBrandi Ollis, NP-C [place] [facility] Pgr: [phone] or if no answer [phone] 05/08/2015, 4:08 PM

## 2015-05-08 NOTE — Progress Notes (Signed)
Paged resp. Regarding pt o2 sat. She said to put her on non rebreather, and she would be to floor to check on her.

## 2015-05-08 NOTE — ED Notes (Signed)
Attempted to call report

## 2015-05-08 NOTE — ED Notes (Signed)
Notified RN of 02 stats

## 2015-05-08 NOTE — Telephone Encounter (Signed)
Called and spoke to pt's daughter, Babette Relicammy. [facility] and is next up for a bed. Dr. Craige CottaSood consulted on pt today 05/08/2015.   Will forward MR as FYI.

## 2015-05-08 NOTE — Progress Notes (Signed)
I was called by "Gearldine BienenstockBrandy" regarding this patient. I did try to call her back, but there was no answer and I left a voicemail. Patient does have known T12 and L1 compression fractures. That we have been treating nonoperatively. She called office yesterday reporting increased pain and I recommended being evaluated again thei AM for consideration of a kyphoplasty procedure. Patient 02 sats appear suboptimal presently. Patient may need a kyphoplasty when medically stable. I have asked Huston FoleyBrady in my voicemail to call into the operating room so that we can discuss her medical status (I am about to scub into surgery).

## 2015-05-08 NOTE — ED Notes (Signed)
Pts female urinal emptied with 560cc noted to be urinal. Pt noted to have wet chux pad underneath at this time. This tech went to provide pt with clean chux and linen and pt stated "no do not move me. I have broken bones and I do not want to be moved."

## 2015-05-08 NOTE — ED Provider Notes (Signed)
CSN: [id number]     Arrival date & time 05/08/15  0754 History   First MD Initiated Contact with Patient 05/08/15 [phone]     Chief Complaint  Patient presents with  . Shortness of Breath     (Consider location/radiation/quality/duration/timing/severity/associated sxs/prior Treatment) HPI  Pt with hx of pulmonary fibrosis and pulmonary hypertension presenting with worsening shortness of breath.  She has chronic difficulty breathing but this has become worse over the past 2-3 days  She uses 3-4 L O2 at home- but on EMS arrival O2 sats were in the 70s.  No chest pain.  She has been wearing back brace recently for vertebral fractures- she feels that this is worsening her breathing. No leg swelling.  No fever no cough.  There are no other associated systemic symptoms, there are no other alleviating or modifying factors.   Past Medical History  Diagnosis Date  . Interstitial lung disease (HCC)   . IPF (idiopathic pulmonary fibrosis) (HCC)   . Pulmonary hypertension (HCC)   . Chronic respiratory failure with hypoxia [facility])    Past Surgical History  Procedure Laterality Date  . Appendectomy    . Tubal ligation    . Cardiac catheterization N/A 11/08/2014    Procedure: Right Heart Cath;  Surgeon: Laurey Morale, MD;  Location: [facility] INVASIVE CV LAB;  Service: Cardiovascular;  Laterality: N/A;   Family History  Problem Relation Age of Onset  . Kidney failure Mother   . COPD Mother   . COPD Brother    Social History  Substance Use Topics  . Smoking status: Passive Smoke Exposure - Never Smoker  . Smokeless tobacco: None  . Alcohol Use: No   OB History    No data available     Review of Systems  ROS reviewed and all otherwise negative except for mentioned in HPI    Allergies  Penicillins  Home Medications   Prior to Admission medications   Medication Sig Start Date End Date Taking? Authorizing Provider  ambrisentan (LETAIRIS) 5 MG tablet Take 1 tablet (5 mg total) by mouth daily.  01/29/15  Yes Kalman Shan, MD  aspirin 81 MG tablet Take 81 mg by mouth daily.   Yes Historical Provider, MD  famotidine (PEPCID) 20 MG tablet Take 1 tablet (20 mg total) by mouth at bedtime. 06/04/14  Yes Nyoka Cowden, MD  fluticasone (FLONASE) 50 MCG/ACT nasal spray Place 2 sprays into both nostrils daily. 02/13/15  Yes Kalman Shan, MD  furosemide (LASIX) 40 MG tablet Take 1 tablet (40 mg total) by mouth daily. 01/29/15  Yes Kalman Shan, MD  guaiFENesin (MUCINEX) 600 MG 12 hr tablet Take 1,200 mg by mouth as needed for congestion.   Yes Historical Provider, MD  HYDROcodone-acetaminophen (NORCO/VICODIN) 5-325 MG tablet Take 1 tablet by mouth every 6 (six) hours as needed for moderate pain.   Yes Historical Provider, MD  ibuprofen (ADVIL,MOTRIN) 800 MG tablet Take 800 mg by mouth 2 (two) times daily as needed. pain 04/03/15  Yes Historical Provider, MD  ipratropium-albuterol (DUONEB) 0.5-2.5 (3) MG/3ML SOLN Take 3 mLs by nebulization 3 (three) times daily. 11/21/14  Yes William S Minor, NP  pantoprazole (PROTONIX) 40 MG tablet TAKE 1 TABLET BY MOUTH 30 TO 60 MINUTES BEFORE FIRST MEAL OF THE DAY 02/13/15  Yes Kalman Shan, MD  potassium chloride (K-DUR) 10 MEQ tablet Take 1 tablet (10 mEq total) by mouth daily. 01/29/15  Yes Kalman Shan, MD  predniSONE (DELTASONE) 10 MG tablet 30 mg tablets  daily Patient taking differently: Take 30 mg by mouth daily. 30 mg tablets daily 03/18/15  Yes Kalman Shan, MD  Saline (SIMPLY SALINE) 0.9 % AERS Place 1 spray into the nose daily as needed (allergies).    Yes Historical Provider, MD  sildenafil (REVATIO) 20 MG tablet Take 1 tablet (20 mg total) by mouth 3 (three) times daily. 11/13/14  Yes Lupita Leash, MD  SSD 1 % cream Apply 1 application topically daily. 05/01/15  Yes Historical Provider, MD  tiZANidine (ZANAFLEX) 2 MG tablet Take 2-4 mg by mouth every 8 (eight) hours as needed for muscle spasms.   Yes Historical Provider, MD  traMADol  (ULTRAM) 50 MG tablet Take 1 tablet (50 mg total) by mouth every 8 (eight) hours as needed for moderate pain. Patient not taking: Reported on 05/08/2015 03/29/15   Kalman Shan, MD   BP 125/63 mmHg  Pulse 105  Temp(Src) 98 F (36.7 C) (Oral)  Resp 32  Ht  (1.575 m)  Wt 88.451 kg  BMI 35.66 kg/m2  SpO2 91%  Vitals reviewed Physical Exam  Physical Examination: General appearance - alert, well appearing, and in no distress Mental status - alert, oriented to person, place, and time Eyes - no conjunctival injection, no scleral icterus Chest - BSS, decreased air movement with bilateral wheezing, pt speaking in short sentences Heart - normal rate, regular rhythm, normal S1, S2, no murmurs, rubs, clicks or gallops Abdomen - soft, nontender, nondistended, no masses or organomegaly Neurological - alert, oriented, normal speech, no focal findings Extremities - peripheral pulses normal, no pedal edema, no clubbing or cyanosis Skin - normal coloration and turgor, no rashes  ED Course  Procedures (including critical care time) Labs Review Labs Reviewed  CBC - Abnormal; Notable for the following:    RBC 3.41 (*)    Hemoglobin 10.6 (*)    HCT 34.0 (*)    RDW 17.3 (*)    Platelets 149 (*)    All other components within normal limits  BASIC METABOLIC PANEL - Abnormal; Notable for the following:    Chloride 95 (*)    CO2 36 (*)    All other components within normal limits  BRAIN NATRIURETIC PEPTIDE  I-STAT TROPOININ, ED    Imaging Review Dg Chest Port 1 View  05/08/2015  CLINICAL DATA:  75 year old female with a history of increasing shortness of breath EXAM: PORTABLE CHEST 1 VIEW COMPARISON:  Plain film 11/19/2014, CT 11/15/2014 FINDINGS: Limited plain film given patient positioning and overlying soft tissues of the chest wall/ abdominal wall. Cardiomediastinal silhouette likely unchanged. No visualized pneumothorax. Mixed interstitial and airspace opacities, improved from the plain  film of 11/19/2014. IMPRESSION: Limited plain film, with overall improved aeration from the comparison 11/19/2014. Mixed interstitial and airspace opacities bilaterally represents background of patient's known chronic interstitial disease, with superimposed pneumonitis/ pneumonia difficult to exclude. If there is concern for more sensitive/specific evaluation, a repeat formal PA and lateral chest x-ray may be considered. Signed, Yvone Neu. Loreta Ave, DO [facility] Radiology Electronically Signed   By: Gilmer Mor D.O.   On: 05/08/2015 08:43   I have personally reviewed and evaluated these images and lab results as part of my medical decision-making.   EKG Interpretation   Date/Time:  Wednesday May 08 2015 08:01:21 EDT Ventricular Rate:  89 PR Interval:  152 QRS Duration: 89 QT Interval:  364 QTC Calculation: 443 R Axis:   107 Text Interpretation:  Sinus rhythm Probable left  atrial enlargement Right  axis deviation Low voltage, precordial leads Anteroseptal infarct, old  Minimal ST elevation, inferior leads Baseline wander in lead(s) V4 V5 No  significant change since last tracing Confirmed by [facility]  MD, Darcy Cordner  [phone]([id number]) on 05/08/2015 8:44:45 AM      MDM   Final diagnoses:  Acute on chronic respiratory failure with hypoxia (HCC)  Other back pain    Pt presenting with c/o shortness of breath- she has hx of pulmonary hypertension and fibrosis.  She is wheezing and intermittently hypoxic.  CXR shows no fluid overload or acute pneumonia.  Pt started on breathing treatments, solumedrol and IV abx.  D/w pulmonary who is going to admit the patient.    12:06 PM d/w Dr. Craige CottaSood, PCCM- they will see patient in the ED.    Jerelyn ScottMartha Linker, MD 05/08/15 [phone]

## 2015-05-08 NOTE — ED Notes (Signed)
O2 87% on 8L, 40%FiO2.  RT at bedside.

## 2015-05-08 NOTE — Progress Notes (Addendum)
eLink Physician-Brief Progress Note Patient Name: Dimitria D Gerbino DOB: 03-May-1940 MRN: [id number]   Date of Service  05/08/2015  HPI/Events of Note  Anxiety  eICU Interventions  PRN alprazolam     Intervention Category Intermediate Interventions: Other:  Billy FischerDavid Ren Aspinall 05/08/2015, 11:45 PM

## 2015-05-08 NOTE — Progress Notes (Signed)
Miranda Hines [id number] Admission Data: 05/08/2015 6:53 PM Attending Provider: Coralyn HellingVineet Sood, MD  UUV:[id number]:FULP, [name], MD Consults/ Treatment Team:    Miranda Hines is a 75 y.o. female patient admitted from ED awake, alert  & orientated  X 4,  Full Code, O2  8L, 40% FiO2, Regular, labored breathing, dyspnea at exertion, dyspnea at rest. Pain rated 4/10 to lower back. No c/o chest pain, no distress noted. Tele # 24 placed and pt is currently running: Sinus Tach bundle branch block. RN placed Continuous pulse ox on pt.    IV site WDL: Right AC with a transparent dsg that's clean dry and intact[facility]   Allergies:   Allergies  Allergen Reactions  . Penicillins Swelling     Past Medical History  Diagnosis Date  . Interstitial lung disease (HCC)   . IPF (idiopathic pulmonary fibrosis) (HCC)   . Pulmonary hypertension (HCC)   . Chronic respiratory failure with hypoxia (HCC)    Pt orientation to unit, room and routine. SR up x 2, Patient and family verbalizing understanding of risks associated with falls. Pt verbalizes an understanding of how to use the call bell and to call for help before getting out of bed.  Skin noted multiple scattered bruising to BUE, BLE. Skin breakdown noted to right and left groin. Scab like spot quarter size noted to lower mid back. BLE is +3 edematous. Skin assessment done with Katie RN.   Will cont to monitor and assist as needed.  Tobin Chadracy Chao Blazejewski, RN 05/08/2015 7:00 PM

## 2015-05-08 NOTE — Telephone Encounter (Signed)
Spoke with pt's daughter, Babette Relicammy.  Reports [facility] and back pain.  Reports plan is for pt to be admitted.  She is calling as FYI for MR.  Will route to MR as FYI.

## 2015-05-08 NOTE — Progress Notes (Signed)
Patient began to desat below 88 on ventri mask 8L, 40%. Placed on non-rebreather, currently 02 sat at 96.  Respiratory and oncall Pulmonary MD made aware. No new orders placed, will continue to monitor.

## 2015-05-08 NOTE — ED Notes (Signed)
Pt sats dropped to mid 80's% with good waveform.  Pt encouraged to take deep breaths and not talk, sats rebounded to 91%.  Pt currently on Venturi mask at 8L, 40% FiO2

## 2015-05-08 NOTE — ED Notes (Signed)
Pt arrives from home via [facility] reporting increased SOB x 3 days with cough, hx pulmonary fibrosis and pulmonary hypertension.  EMS reports pt's O2 88-94% at baseline, lives on 3-4L. Upon arrival pt sats at 70% on 4L.  Pt reports dx last week of compression fractures, brace in place upon arrival.

## 2015-05-09 DIAGNOSIS — D649 Anemia, unspecified: Secondary | ICD-10-CM

## 2015-05-09 LAB — CBC
HCT: 32.1 % — ABNORMAL LOW (ref 36.0–46.0)
Hemoglobin: 10.1 g/dL — ABNORMAL LOW (ref 12.0–15.0)
MCH: 31 pg (ref 26.0–34.0)
MCHC: 31.5 g/dL (ref 30.0–36.0)
MCV: 98.5 fL (ref 78.0–100.0)
PLATELETS: 160 10*3/uL (ref 150–400)
RBC: 3.26 MIL/uL — AB (ref 3.87–5.11)
RDW: 17.1 % — ABNORMAL HIGH (ref 11.5–15.5)
WBC: 7 10*3/uL (ref 4.0–10.5)

## 2015-05-09 LAB — BASIC METABOLIC PANEL
Anion gap: 11 (ref 5–15)
BUN: 15 mg/dL (ref 6–20)
CALCIUM: 8.6 mg/dL — AB (ref 8.9–10.3)
CHLORIDE: 87 mmol/L — AB (ref 101–111)
CO2: 41 mmol/L — AB (ref 22–32)
CREATININE: 0.56 mg/dL (ref 0.44–1.00)
GFR calc non Af Amer: >60 mL/min (ref >60)
GLUCOSE: 102 mg/dL — AB (ref 65–99)
Potassium: 3.8 mmol/L (ref 3.5–5.1)
Sodium: 139 mmol/L (ref 135–145)

## 2015-05-09 MED ORDER — FUROSEMIDE 10 MG/ML IJ SOLN
40.0000 mg | Freq: Every day | INTRAMUSCULAR | Status: DC
  Administered 2015-05-09 – 2015-05-10 (×2): 40 mg via INTRAVENOUS
  Filled 2015-05-09 (×3): qty 4

## 2015-05-09 NOTE — Care Management Note (Signed)
Case Management Note  Patient Details  Name: Aliviya D Sunderland MRN: [id number] Date of Birth: 08/26/1940  Subjective/Objective:                 Spoke wit hpatient and daughter at bedside. She states that she lives at home with her spouse and son. Son is able to provide 24/7 supervision. Spouse will be discharged today after obs stay for falling when he got up quickly to see what EMS was doing when called for wife for this admission. Patient is currently on NRB. Has brace for back that she states is painful to take deep breaths with.    Action/Plan:  [facility]  Expected Discharge Date:                  Expected Discharge Plan:  Skilled Nursing Facility  In-House Referral:     Discharge planning Services  CM Consult  Post Acute Care Choice:    Choice offered to:     DME Arranged:    DME Agency:     HH Arranged:    HH Agency:     Status of Service:  In process, will continue to follow  Medicare Important Message Given:    Date Medicare IM Given:    Medicare IM give by:    Date Additional Medicare IM Given:    Additional Medicare Important Message give by:     If discussed at Long Length of Stay Meetings, dates discussed:    Additional Comments:  Lawerance SabalDebbie Ryann Leavitt, RN 05/09/2015, 4:06 PM

## 2015-05-09 NOTE — Progress Notes (Signed)
PULMONARY / CRITICAL CARE MEDICINE   Name: Miranda Hines MRN: [id number] DOB: [birth year]    ADMISSION DATE:  05/08/2015  REFERRING MD:  ER  CHIEF COMPLAINT:  Short of breath  SUMMARY:   75 y/o female with hx of ILD and pulmonary hypertension has progressive shortness of breath.  She has been followed by Dr. [name] with orthopedics for T12 and L1 compression fractures.  She was placed in a brace that she is to wear during the day.  She reports this makes her breathing extremely difficulty, and she feels like she can't take a deep breath in.  She also has not been taking her lasix recently because it is too painful for her to walk to go the bathroom.  She had prednisone dose increased recently as outpt and this didn't help.  She has been given neb tx w/o any difference.  She normally wears 3 to 4 liters oxygen at home.  She feels her breathing is okay when she is at rest and not wearing back brace.  She denies chest pain, sputum, fever, nausea, or abdominal pain.  Her leg swelling has gotten much worse.  SUBJECTIVE:  Pt reports significant pain overnight, difficult to get out of bed to use the restroom.  RN reports pt on 15L O2/50% via VM.  Reports she took dilaudid last PM and felt "very strange, like something was wrong".    VITAL SIGNS: BP 110/41 mmHg  Pulse 97  Temp(Src) 97.8 F (36.6 C) (Oral)  Resp 18  Ht 5\' 2"  (1.575 m)  Wt 219 lb 12.8 oz (99.7 kg)  BMI 40.19 kg/m2  SpO2 98%  INTAKE / OUTPUT: I/O last 3 completed shifts: In: -  Out: 2200 [Urine:2200]  PHYSICAL EXAMINATION: General:  Elderly female in NAD lying in bed Neuro:  Alert, normal strength, moves all extremities, CN intact HEENT:  Pupils reactive, moon facies, no stridor, speaks in full sentences  Cardiovascular:  Regular, tachycardic, no murmur Lungs:  Dyspneic but not labored, B/l crackles, no wheeze Abdomen:  Soft, non tender, +bowel sounds Musculoskeletal:  3+ edema Skin:  No rashes or  lesions  LABS:  BMET  Recent Labs Lab 05/08/15 0825 05/09/15 0442  NA 142 139  K 4.0 3.8  CL 95* 87*  CO2 36* 41*  BUN 18 15  CREATININE 0.56 0.56  GLUCOSE 90 102*    Electrolytes  Recent Labs Lab 05/08/15 0825 05/09/15 0442  CALCIUM 8.9 8.6*    CBC  Recent Labs Lab 05/08/15 0825 05/09/15 0442  WBC 7.6 7.0  HGB 10.6* 10.1*  HCT 34.0* 32.1*  PLT 149* 160    Imaging No results found.   STUDIES:  12/13/12  PFT >> FEV1 0.91, (44%), FEV1% 99, TLC 2.18 (45%), DLCO 42% 10/21/14  Echo >> EF 60 to 65%, mild LVH, grade 1 diastolic CHF 11/08/14  RHC >> RA 4, RV 40/1, PA 45/19 mean 32, PCWP 8, CI 2.15, PVR 6.5 WU 11/15/14  HRCT chest >> patchy GGO, btx with random distribution, air trapping 04/23/15  MRI lumbar spine >> mod T12 and severe L1 compression fx  SIGNIFICANT EVENTS: 4/26  Admit 4/27  Remains on 15L   DISCUSSION: 75 y/o female with progressive dyspnea related to severe back pain with lung restriction from wearing brace in setting of ILD.  She also has hypervolemia related to inability to take outpt lasix, due to inability to go to the bathroom because of severe back pain.  ASSESSMENT / PLAN:  Dyspnea. ILD >>  process undefined; negative serology from October 2016. Acute on chronic hypoxic respiratory failure. Pulmonary hypertension. Chronic diastolic CHF.  Plan: - oxygen to keep SpO2 90 to 95% - prn duonebs  - lasix 40 mg IV daily - prednisone 10 mg daily - continue revatio, letaris  T12, L1 compression fx's. - appreciate orthopedics input - do not feel she is a surgical candidate at this point with increased O2 needs and hypervolemia. - hydrocodone, dilaudid PRN  Hx of GERD. - continue protonix, pepcid  Goals of care >> Full Code DVT prophylaxis >> Lovenox  Updated patient at bedside on plan of care.    PCCM will continue to follow for pulmonary needs.     Canary Brim, NP-C [facility] Pgr: [phone] or if  no answer [phone] 05/09/2015, 9:43 AM

## 2015-05-09 NOTE — Consult Note (Addendum)
   [facility]   05/09/2015  Emmalea D Brabant 1940/12/12 [id number]   Thank you for this consult.  [facility]. Originally thought patient was not in the ACO but upon further checking.   [facility] because her Primary Care Provider. Came by to speak with patient, family at beside.  Patient was very dyspneic and asked to come back at another time.  Patient did receive the brochure to [facility].  Will follow up at a more appropriate time.  Charlesetta ShanksVictoria Kayliah Tindol, RN BSN CCM [facility] Lia[facility]  [phone] business mobile phone Toll free office [phone]

## 2015-05-09 NOTE — Progress Notes (Signed)
PROGRESS NOTE    Miranda Hines  ZOX:[id number] DOB: [birth year] DOA: 05/08/2015 PCP: Cain SaupeFULP, [name], MD ) Outpatient Specialists: Dr [name] with orthopedics.     Brief Narrative: 75 yo female with hx of ILD and pulmonary hypertension presents with  progressive shortness of breath. Recently had back surgery and is on brace which has limited her breathing.  She is currently on nrb and lasix for diuresis.   Assessment & Plan:   Active Problems:   Dyspnea  Acute on chronic respiratory failure from fluid over load from acute on chronic diastolic heart failure:  ILD; On tele and high flow oxygen.  [facility] to [facility] service on 4/ 27.  Resume daily lasix for diuresis and monitor creatinine and electrolytes.  Keep sats greater than 90% Daily prednisone as per PCCM.   Compression fracture of the T12 and L1: Pain control and brace to be used as per orthopedics.    Anemia: Normocytic monitor.    DVT prophylaxis: lovenox.  Code Status: full  Family Communication: multiple family members at bedside.  Disposition Plan: pending further diuresis.    Consultants:   PCCM   Procedures: none   Antimicrobials: none   Subjective: Wanted to know how long she needs to stay in the hospital.   Objective: Filed Vitals:   05/08/15 2115 05/08/15 2156 05/08/15 2156 05/09/15 0620  BP:  144/87 144/87 110/41  Pulse:  105 105 97  Temp:  97.9 F (36.6 C) 97.9 F (36.6 C) 97.8 F (36.6 C)  TempSrc:  Oral    Resp:  20 20 18   Height:      Weight:    99.7 kg (219 lb 12.8 oz)  SpO2: 98% 93% 93% 98%    Intake/Output Summary (Last 24 hours) at 05/09/15 1821 Last data filed at 05/09/15 1626  Gross per 24 hour  Intake    480 ml  Output    600 ml  Net   -120 ml   Filed Weights   05/08/15 0803 05/08/15 1900 05/09/15 0620  Weight: 88.451 kg (195 lb) 99.1 kg (218 lb 7.6 oz) 99.7 kg (219 lb 12.8 oz)    Examination:  General exam: Appears  calm  Respiratory system: diminished at bases no wheezing. Scattered crackles, on high flow oxygen.  Cardiovascular system: S1 & S2 heard,  Gastrointestinal system: Abdomen is nondistended, soft and nontender. No organomegaly or masses felt. Normal bowel sounds heard. Central nervous system: Alert and oriented. No focal neurological deficits. Extremities: Symmetric 5 x 5 power.bilateral  Skin: No rashes, lesions or ulcers Psychiatry: Judgement and insight appear normal. Mood & affect appropriate.     Data Reviewed: I have personally reviewed following labs and imaging studies  CBC:  Recent Labs Lab 05/08/15 0825 05/09/15 0442  WBC 7.6 7.0  HGB 10.6* 10.1*  HCT 34.0* 32.1*  MCV 99.7 98.5  PLT 149* 160   Basic Metabolic Panel:  Recent Labs Lab 05/08/15 0825 05/09/15 0442  NA 142 139  K 4.0 3.8  CL 95* 87*  CO2 36* 41*  GLUCOSE 90 102*  BUN 18 15  CREATININE 0.56 0.56  CALCIUM 8.9 8.6*   GFR: Estimated Creatinine Clearance: 70.1 mL/min (by C-G formula based on Cr of 0.56). Liver Function Tests: No results for input(s): AST, ALT, ALKPHOS, BILITOT, PROT, ALBUMIN in the last 168 hours. No results for input(s): LIPASE, AMYLASE in the last 168 hours. No results for input(s): AMMONIA in the last 168 hours.  Coagulation Profile: No results for input(s): INR, PROTIME in the last 168 hours. Cardiac Enzymes: No results for input(s): CKTOTAL, CKMB, CKMBINDEX, TROPONINI in the last 168 hours. BNP (last 3 results) No results for input(s): PROBNP in the last 8760 hours. HbA1C: No results for input(s): HGBA1C in the last 72 hours. CBG: No results for input(s): GLUCAP in the last 168 hours. Lipid Profile: No results for input(s): CHOL, HDL, LDLCALC, TRIG, CHOLHDL, LDLDIRECT in the last 72 hours. Thyroid Function Tests: No results for input(s): TSH, T4TOTAL, FREET4, T3FREE, THYROIDAB in the last 72 hours. Anemia Panel: No results for input(s): VITAMINB12, FOLATE, FERRITIN,  TIBC, IRON, RETICCTPCT in the last 72 hours. Urine analysis:    Component Value Date/Time   COLORURINE YELLOW 10/20/2014 1705   APPEARANCEUR CLOUDY* 10/20/2014 1705   LABSPEC 1.006 10/20/2014 1705   PHURINE 8.0 10/20/2014 1705   GLUCOSEU NEGATIVE 10/20/2014 1705   HGBUR NEGATIVE 10/20/2014 1705   BILIRUBINUR NEGATIVE 10/20/2014 1705   KETONESUR NEGATIVE 10/20/2014 1705   PROTEINUR NEGATIVE 10/20/2014 1705   UROBILINOGEN 1.0 10/20/2014 1705   NITRITE NEGATIVE 10/20/2014 1705   LEUKOCYTESUR NEGATIVE 10/20/2014 1705   Sepsis Labs: (procalcitonin:4,lacticidven:4)  )No results found for this or any previous visit (from the past 240 hour(s)).       Radiology Studies: Dg Chest Port 1 View  05/08/2015  CLINICAL DATA:  75 year old female with a history of increasing shortness of breath EXAM: PORTABLE CHEST 1 VIEW COMPARISON:  Plain film 11/19/2014, CT 11/15/2014 FINDINGS: Limited plain film given patient positioning and overlying soft tissues of the chest wall/ abdominal wall. Cardiomediastinal silhouette likely unchanged. No visualized pneumothorax. Mixed interstitial and airspace opacities, improved from the plain film of 11/19/2014. IMPRESSION: Limited plain film, with overall improved aeration from the comparison 11/19/2014. Mixed interstitial and airspace opacities bilaterally represents background of patient's known chronic interstitial disease, with superimposed pneumonitis/ pneumonia difficult to exclude. If there is concern for more sensitive/specific evaluation, a repeat formal PA and lateral chest x-ray may be considered. Signed, Yvone Neu. Loreta Ave, DO [facility] Radiology Electronically Signed   By: Gilmer Mor D.O.   On: 05/08/2015 08:43        Scheduled Meds: . ambrisentan  5 mg Oral Daily  . aspirin  81 mg Oral Daily  . enoxaparin (LOVENOX) injection  40 mg Subcutaneous Q24H  . famotidine  20 mg Oral QHS  .  fluticasone  2 spray Each Nare Daily  . furosemide  40 mg Intravenous Daily  . pantoprazole  40 mg Oral Daily  . predniSONE  10 mg Oral Q breakfast  . sildenafil  20 mg Oral TID   Continuous Infusions:    LOS: 1 day    Time spent: 30 minutes.     Kathlen Mody, MD [facility] Pager [phone] If 7PM-7AM, please contact night-coverage [email or web address] Password [facility] 05/09/2015, 6:21 PM

## 2015-05-09 NOTE — Consult Note (Signed)
Reason for Consult: Compression Fractures, T12 & L1 Referring Physician: Dr. Normajean Baxter  Miranda Hines is an 75 y.o. female.   HPI: Miranda Hines is known to Miranda Hines, she was evaluated in our office last week for compression fractures of T12 and L1. She was initiated on a conservative treatment pathway and was placed in a TLSO brace. It appears she has been struggling a lot with the brace as it has been making it difficult for her to breathe, she is on home oxygen at baseline. She stopped taking her lasix as it was difficult and painful for her to get to the bathroom. She was supposed to f/u in the office Wednesday morning but was experiencing SOB and presented to the ED. She was subsequently admitted and is now being followed by medicine team/pulmonogy. She is having difficulties maintaining her O2 sats and is currently on 8L oxygen through mask. She continues to have substantial back pain with activity and ambulation. We were consulted to discuss potential kyphoplasty. She denies any radiating pain. She has improved SOB with O2. Her brace is at home.   Past Medical History  Diagnosis Date  . Interstitial lung disease ([place])   . IPF (idiopathic pulmonary fibrosis) ([place])   . Pulmonary hypertension ([place])   . Chronic respiratory failure with hypoxia [facility])     Past Surgical History  Procedure Laterality Date  . Appendectomy    . Tubal ligation    . Cardiac catheterization N/A 11/08/2014    Procedure: Right Heart Cath;  Surgeon: Larey Dresser, MD;  Location: [place] [facility];  Service: Cardiovascular;  Laterality: N/A;    Family History  Problem Relation Age of Onset  . Kidney failure Mother   . COPD Mother   . COPD Brother     Social History:  reports that she has been passively smoking.  She does not have any smokeless tobacco history on file. She reports that she does not drink alcohol or use illicit drugs.  Allergies:  Allergies  Allergen Reactions  . Penicillins Swelling    Medications:   Current facility-administered medications:  .  0.9 %  sodium chloride infusion, , Intravenous, PRN, Chesley Mires, MD .  ALPRAZolam Duanne Moron) tablet 0.25 mg, 0.25 mg, Oral, Q4H PRN, Wilhelmina Mcardle, MD, 0.25 mg at 05/09/15 0005 .  ambrisentan (LETAIRIS) tablet 5 mg, 5 mg, Oral, Daily, Chesley Mires, MD, 5 mg at 05/09/15 0853 .  aspirin chewable tablet 81 mg, 81 mg, Oral, Daily, Chesley Mires, MD, 81 mg at 05/09/15 0854 .  enoxaparin (LOVENOX) injection 40 mg, 40 mg, Subcutaneous, Q24H, Romona Curls, RPH, 40 mg at 05/08/15 1459 .  famotidine (PEPCID) tablet 20 mg, 20 mg, Oral, QHS, Chesley Mires, MD, 20 mg at 05/08/15 2217 .  fluticasone (FLONASE) 50 MCG/ACT nasal spray 2 spray, 2 spray, Each Nare, Daily, Chesley Mires, MD, 2 spray at 05/09/15 0854 .  guaiFENesin (MUCINEX) 12 hr tablet 1,200 mg, 1,200 mg, Oral, BID PRN, Chesley Mires, MD .  HYDROcodone-acetaminophen (NORCO/VICODIN) 5-325 MG per tablet 1 tablet, 1 tablet, Oral, Q6H PRN, Chesley Mires, MD .  HYDROmorphone (DILAUDID) injection 0.5-1 mg, 0.5-1 mg, Intravenous, Q3H PRN, Chesley Mires, MD, 1 mg at 05/08/15 1802 .  ibuprofen (ADVIL,MOTRIN) tablet 600 mg, 600 mg, Oral, Q6H PRN, Chesley Mires, MD .  ipratropium-albuterol (DUONEB) 0.5-2.5 (3) MG/3ML nebulizer solution 3 mL, 3 mL, Nebulization, Q2H PRN, Chesley Mires, MD .  pantoprazole (PROTONIX) EC tablet 40 mg, 40 mg, Oral, Daily, Chesley Mires, MD,  40 mg at 05/09/15 0854 .  predniSONE (DELTASONE) tablet 10 mg, 10 mg, Oral, Q breakfast, Chesley Mires, MD, 10 mg at 05/09/15 0854 .  sildenafil (REVATIO) tablet 20 mg, 20 mg, Oral, TID, Chesley Mires, MD, 20 mg at 05/09/15 0854 .  tiZANidine (ZANAFLEX) tablet 2-4 mg, 2-4 mg, Oral, Q8H PRN, Chesley Mires, MD .  traMADol (ULTRAM) tablet 50 mg, 50 mg, Oral, Q8H PRN, Chesley Mires, MD  Results for orders placed or performed during the hospital encounter of 05/08/15 (from the past 48 hour(s))  CBC     Status: Abnormal   Collection Time: 05/08/15  8:25 AM  Result Value Ref  Range   WBC 7.6 4.0 - 10.5 K/uL   RBC 3.41 (L) 3.87 - 5.11 MIL/uL   Hemoglobin 10.6 (L) 12.0 - 15.0 g/dL   HCT 34.0 (L) 36.0 - 46.0 %   MCV 99.7 78.0 - 100.0 fL   MCH 31.1 26.0 - 34.0 pg   MCHC 31.2 30.0 - 36.0 g/dL   RDW 17.3 (H) 11.5 - 15.5 %   Platelets 149 (L) 150 - 400 K/uL  Basic metabolic panel     Status: Abnormal   Collection Time: 05/08/15  8:25 AM  Result Value Ref Range   Sodium 142 135 - 145 mmol/L   Potassium 4.0 3.5 - 5.1 mmol/L   Chloride 95 (L) 101 - 111 mmol/L   CO2 36 (H) 22 - 32 mmol/L   Glucose, Bld 90 65 - 99 mg/dL   BUN 18 6 - 20 mg/dL   Creatinine, Ser 0.56 0.44 - 1.00 mg/dL   Calcium 8.9 8.9 - 10.3 mg/dL   GFR calc non Af Amer >60 >60 mL/min   GFR calc Af Amer >60 >60 mL/min    Comment: (NOTE) The eGFR has been calculated using the CKD EPI equation. This calculation has not been validated in all clinical situations. eGFR's persistently <60 mL/min signify possible Chronic Kidney Disease.    Anion gap 11 5 - 15  Brain natriuretic peptide     Status: None   Collection Time: 05/08/15  8:25 AM  Result Value Ref Range   B Natriuretic Peptide 64.9 0.0 - 100.0 pg/mL  I-stat troponin, ED     Status: None   Collection Time: 05/08/15  8:29 AM  Result Value Ref Range   Troponin i, poc 0.01 0.00 - 0.08 ng/mL   Comment 3            Comment: Due to the release kinetics of cTnI, a negative result within the first hours of the onset of symptoms does not rule out myocardial infarction with certainty. If myocardial infarction is still suspected, repeat the test at appropriate intervals.   Basic metabolic panel     Status: Abnormal   Collection Time: 05/09/15  4:42 AM  Result Value Ref Range   Sodium 139 135 - 145 mmol/L   Potassium 3.8 3.5 - 5.1 mmol/L   Chloride 87 (L) 101 - 111 mmol/L   CO2 41 (H) 22 - 32 mmol/L   Glucose, Bld 102 (H) 65 - 99 mg/dL   BUN 15 6 - 20 mg/dL   Creatinine, Ser 0.56 0.44 - 1.00 mg/dL   Calcium 8.6 (L) 8.9 - 10.3 mg/dL   GFR  calc non Af Amer >60 >60 mL/min   GFR calc Af Amer >60 >60 mL/min    Comment: (NOTE) The eGFR has been calculated using the CKD EPI equation. This calculation has not been validated  in all clinical situations. eGFR's persistently <60 mL/min signify possible Chronic Kidney Disease.    Anion gap 11 5 - 15  CBC     Status: Abnormal   Collection Time: 05/09/15  4:42 AM  Result Value Ref Range   WBC 7.0 4.0 - 10.5 K/uL   RBC 3.26 (L) 3.87 - 5.11 MIL/uL   Hemoglobin 10.1 (L) 12.0 - 15.0 g/dL   HCT 32.1 (L) 36.0 - 46.0 %   MCV 98.5 78.0 - 100.0 fL   MCH 31.0 26.0 - 34.0 pg   MCHC 31.5 30.0 - 36.0 g/dL   RDW 17.1 (H) 11.5 - 15.5 %   Platelets 160 150 - 400 K/uL    Dg Chest Port 1 View  05/08/2015  CLINICAL DATA:  75 year old female with a history of increasing shortness of breath EXAM: PORTABLE CHEST 1 VIEW COMPARISON:  Plain film 11/19/2014, CT 11/15/2014 FINDINGS: Limited plain film given patient positioning and overlying soft tissues of the chest wall/ abdominal wall. Cardiomediastinal silhouette likely unchanged. No visualized pneumothorax. Mixed interstitial and airspace opacities, improved from the plain film of 11/19/2014. IMPRESSION: Limited plain film, with overall improved aeration from the comparison 11/19/2014. Mixed interstitial and airspace opacities bilaterally represents background of patient's known chronic interstitial disease, with superimposed pneumonitis/ pneumonia difficult to exclude. If there is concern for more sensitive/specific evaluation, a repeat formal PA and lateral chest x-ray may be considered. Signed, Dulcy Fanny. Earleen Newport, DO [facility] Radiology Electronically Signed   By: Corrie Mckusick D.O.   On: 05/08/2015 08:43    ROS Blood pressure 110/41, pulse 97, temperature 97.8 F (36.6 C), temperature source Oral, resp. rate 18, height _0  (1.575 m), weight 99.7 kg (219 lb 12.8 oz), SpO2 98 %.   Physical Exam Pt is  resting comfortably reclined in bed eating breakfast at this time, O2 mask around neck. WDWN caucasian female appearing older than stated age in mild distress secondary to difficulty breathing. She continues to have positive percussion testing abouttT12/L1. Pain with chg in positions. Strength full in BIL LE's, reflexes intact BIL LE. Skin intact LB and BIL LE, SCDs in place. Mod TTP in PSM thoracolumbar regions. Mild swelling BIL LE's. 2+ DPP and cap refill <2sec BIL LE. NVI. NL mood and affect.   Assessment/Plan:  -T12 & L1 Compression fractures  -Dr Lynann Bologna not comfortable performing surgery on the pt at this venture given tenuous medical comorbidities and elective nature of procedure  -Cont conservative care, pt struggling to utilize brace secondary to pulmonary hx. Recommend utilizing as much as possible when not in bed or supportive chair, pt understands she does not need for quick bathroom trips or if in supported position. Unfortunately there are no additional bracing options to offer the patient at this time   -Cont to manage pain with medications and activity modification  -Educated on bone healing process  -Discussed obtaining second opinion regarding kyphoplasty procedure with Dr Patrecia Pour. If pt becomes more medically stable over the weekend Dr Patrecia Pour may feel more comfortable performing procedure. Consult with Dr Estanislado Pandy to discuss possibility of kyphoplsty in future if pt desires.  -Pulmonary hypertension and dyspnea cont to be followed by medical team/pulmonology   Justice Britain 05/09/2015, 9:11 AM

## 2015-05-10 ENCOUNTER — Inpatient Hospital Stay (HOSPITAL_COMMUNITY): Payer: Medicare Other

## 2015-05-10 ENCOUNTER — Encounter (HOSPITAL_COMMUNITY): Payer: Self-pay | Admitting: General Practice

## 2015-05-10 ENCOUNTER — Telehealth: Payer: Self-pay | Admitting: Internal Medicine

## 2015-05-10 DIAGNOSIS — I2609 Other pulmonary embolism with acute cor pulmonale: Secondary | ICD-10-CM

## 2015-05-10 LAB — BASIC METABOLIC PANEL
Anion gap: 10 (ref 5–15)
BUN: 18 mg/dL (ref 6–20)
CALCIUM: 8.4 mg/dL — AB (ref 8.9–10.3)
CO2: 45 mmol/L — ABNORMAL HIGH (ref 22–32)
Chloride: 84 mmol/L — ABNORMAL LOW (ref 101–111)
Creatinine, Ser: 0.59 mg/dL (ref 0.44–1.00)
GFR calc Af Amer: >60 mL/min (ref >60)
GLUCOSE: 101 mg/dL — AB (ref 65–99)
Potassium: 3.5 mmol/L (ref 3.5–5.1)
Sodium: 139 mmol/L (ref 135–145)

## 2015-05-10 MED ORDER — CETYLPYRIDINIUM CHLORIDE 0.05 % MT LIQD
7.0000 mL | Freq: Two times a day (BID) | OROMUCOSAL | Status: DC
  Administered 2015-05-10 – 2015-05-17 (×14): 7 mL via OROMUCOSAL

## 2015-05-10 MED ORDER — METHYLPREDNISOLONE SODIUM SUCC 125 MG IJ SOLR
60.0000 mg | Freq: Two times a day (BID) | INTRAMUSCULAR | Status: DC
  Administered 2015-05-11 – 2015-05-15 (×10): 60 mg via INTRAVENOUS
  Filled 2015-05-10 (×10): qty 2

## 2015-05-10 MED ORDER — METHYLPREDNISOLONE SODIUM SUCC 125 MG IJ SOLR
60.0000 mg | INTRAMUSCULAR | Status: DC
  Administered 2015-05-10: 60 mg via INTRAVENOUS
  Filled 2015-05-10 (×2): qty 2

## 2015-05-10 MED ORDER — FUROSEMIDE 10 MG/ML IJ SOLN
40.0000 mg | Freq: Two times a day (BID) | INTRAMUSCULAR | Status: DC
  Administered 2015-05-10 – 2015-05-11 (×3): 40 mg via INTRAVENOUS
  Filled 2015-05-10 (×4): qty 4

## 2015-05-10 MED ORDER — FUROSEMIDE 10 MG/ML IJ SOLN
40.0000 mg | Freq: Once | INTRAMUSCULAR | Status: AC
  Administered 2015-05-10: 40 mg via INTRAVENOUS

## 2015-05-10 MED ORDER — HYDROMORPHONE HCL 1 MG/ML IJ SOLN
0.5000 mg | INTRAMUSCULAR | Status: DC | PRN
  Administered 2015-05-10 – 2015-05-12 (×3): 0.5 mg via INTRAVENOUS
  Filled 2015-05-10 (×3): qty 1

## 2015-05-10 NOTE — Progress Notes (Signed)
Notified by central monitoring in regards to patient 02 sat. In 70's. Upon entering room patient had face mask off. Put mask back on and sats went into mid 90s. Patient stated she was SOB. RT was notified and requested a breathing treatment. Paged on call provider as well. Will continue to monitor.

## 2015-05-10 NOTE — Progress Notes (Signed)
PT Cancellation Note  Patient Details Name: Miranda Hines MRN: [id number] DOB: 1940/08/21   Cancelled Treatment:    Reason Eval/Treat Not Completed: Medical issues which prohibited therapy Pt on bedrest. Will await increase in activity orders prior to initiation of PT evaluation.   Blake DivineShauna A Mystie Ormand 05/10/2015, 8:04 AM Mylo RedShauna Tahlor Berenguer, PT, DPT [phone]

## 2015-05-10 NOTE — Progress Notes (Signed)
PROGRESS NOTE    Miranda Hines  ZOX:[id number] DOB: 1940-11-03 DOA: 05/08/2015 PCP: Cain SaupeFULP, [name], MD ) Outpatient Specialists: Dr [name] with orthopedics.     Brief Narrative: 75 yo female with hx of ILD and pulmonary hypertension presents with  progressive shortness of breath. Recently had back surgery and is on brace which has limited her breathing.  She is currently on nrb and lasix for diuresis. Overnight and earlier today she had diffuse bronchospasm and we started her on steroids.  Palliative care consulted for goals of care.   Assessment & Plan:   Active Problems:   Dyspnea  Acute on chronic respiratory failure from fluid over load from acute on chronic diastolic heart failure:  ILD; On tele and high flow oxygen about 50% .  [facility] service on 4/ 27.  Resume daily lasix for diuresis and monitor creatinine and electrolytes.  Keep sats greater than 90% Overnight she desaturated as per face mask fell off. Earlier this am she was diffusely wheezing and she was started on IV solumedrol.  CXR this am showed hypo expanded lungs and patchy bilateral air space opacification suggestive for pulmonary edema. Extra dose of lasix given.  Changed the lasix to q12h.   Compression fracture of the T12 and L1: Pain control and brace to be used as per orthopedics.    Anemia: Normocytic monitor.    DVT prophylaxis: lovenox.  Code Status: full  Family Communication: none  at bedside.  Disposition Plan: pending further diuresis.    Consultants:   PCCM   Procedures: none   Antimicrobials: none   Subjective: Reports feeling sob.   Objective: Filed Vitals:   05/10/15 0414 05/10/15 0959 05/10/15 1135 05/10/15 1339  BP: 127/70 99/58 107/45 116/68  Pulse: 107 97 87 103  Temp: 99 F (37.2 C)  98.9 F (37.2 C) 98.5 F (36.9 C)  TempSrc: Oral  Oral Oral  Resp:    22  Height:      Weight: 99.9 kg (220 lb 3.8 oz)       SpO2: 100%  100% 92%    Intake/Output Summary (Last 24 hours) at 05/10/15 1728 Last data filed at 05/10/15 1300  Gross per 24 hour  Intake    240 ml  Output   2000 ml  Net  -1760 ml   Filed Weights   05/08/15 1900 05/09/15 0620 05/10/15 0414  Weight: 99.1 kg (218 lb 7.6 oz) 99.7 kg (219 lb 12.8 oz) 99.9 kg (220 lb 3.8 oz)    Examination:  General exam: Appears calm  Respiratory system: diminished at bases no wheezing. Scattered crackles, on high flow oxygen.  Cardiovascular system: S1 & S2 heard,  Gastrointestinal system: Abdomen is nondistended, soft and nontender. No organomegaly or masses felt. Normal bowel sounds heard. Central nervous system: Alert and oriented. No focal neurological deficits. Extremities: Symmetric 5 x 5 power.bilateral  Skin: No rashes, lesions or ulcers Psychiatry: Judgement and insight appear normal. Mood & affect appropriate.     Data Reviewed: I have personally reviewed following labs and imaging studies  CBC:  Recent Labs Lab 05/08/15 0825 05/09/15 0442  WBC 7.6 7.0  HGB 10.6* 10.1*  HCT 34.0* 32.1*  MCV 99.7 98.5  PLT 149* 160   Basic Metabolic Panel:  Recent Labs Lab 05/08/15 0825 05/09/15 0442 05/10/15 0646  NA 142 139 139  K 4.0 3.8 3.5  CL 95* 87* 84*  CO2 36* 41* 45*  GLUCOSE  90 102* 101*  BUN CREATININE 0.56 0.56 0.59  CALCIUM 8.9 8.6* 8.4*   GFR: Estimated Creatinine Clearance: 70.2 mL/min (by C-G formula based on Cr of 0.59). Liver Function Tests: No results for input(s): AST, ALT, ALKPHOS, BILITOT, PROT, ALBUMIN in the last 168 hours. No results for input(s): LIPASE, AMYLASE in the last 168 hours. No results for input(s): AMMONIA in the last 168 hours. Coagulation Profile: No results for input(s): INR, PROTIME in the last 168 hours. Cardiac Enzymes: No results for input(s): CKTOTAL, CKMB, CKMBINDEX, TROPONINI in the last 168 hours. BNP (last 3 results) No results for input(s): PROBNP in the last  8760 hours. HbA1C: No results for input(s): HGBA1C in the last 72 hours. CBG: No results for input(s): GLUCAP in the last 168 hours. Lipid Profile: No results for input(s): CHOL, HDL, LDLCALC, TRIG, CHOLHDL, LDLDIRECT in the last 72 hours. Thyroid Function Tests: No results for input(s): TSH, T4TOTAL, FREET4, T3FREE, THYROIDAB in the last 72 hours. Anemia Panel: No results for input(s): VITAMINB12, FOLATE, FERRITIN, TIBC, IRON, RETICCTPCT in the last 72 hours. Urine analysis:    Component Value Date/Time   COLORURINE YELLOW 10/20/2014 1705   APPEARANCEUR CLOUDY* 10/20/2014 1705   LABSPEC 1.006 10/20/2014 1705   PHURINE 8.0 10/20/2014 1705   GLUCOSEU NEGATIVE 10/20/2014 1705   HGBUR NEGATIVE 10/20/2014 1705   BILIRUBINUR NEGATIVE 10/20/2014 1705   KETONESUR NEGATIVE 10/20/2014 1705   PROTEINUR NEGATIVE 10/20/2014 1705   UROBILINOGEN 1.0 10/20/2014 1705   NITRITE NEGATIVE 10/20/2014 1705   LEUKOCYTESUR NEGATIVE 10/20/2014 1705   Sepsis Labs: (procalcitonin:4,lacticidven:4)  )No results found for this or any previous visit (from the past 240 hour(s)).       Radiology Studies: Dg Chest Port 1 View  05/10/2015  CLINICAL DATA:  Acute onset of dyspnea.  Initial encounter. EXAM: PORTABLE CHEST 1 VIEW COMPARISON:  Chest radiograph from 05/08/2015 FINDINGS: The lungs are hypoexpanded. Patchy bilateral airspace opacification raises concern for pulmonary edema, though pneumonia could have a similar appearance. A small left pleural effusion is noted. No pneumothorax is seen. The cardiomediastinal silhouette is borderline normal in size. No acute osseous abnormalities are identified. IMPRESSION: Lungs hypoexpanded. Patchy bilateral airspace opacification raises concern for pulmonary edema, though pneumonia could have a similar appearance. Small left pleural effusion noted. Electronically Signed   By: Roanna Raider M.D.   On: 05/10/2015 03:55        Scheduled Meds: .  ambrisentan  5 mg Oral Daily  . antiseptic oral rinse  7 mL Mouth Rinse BID  . aspirin  81 mg Oral Daily  . enoxaparin (LOVENOX) injection  40 mg Subcutaneous Q24H  . famotidine  20 mg Oral QHS  . fluticasone  2 spray Each Nare Daily  . furosemide  40 mg Intravenous Daily  . [START ON 05/11/2015] methylPREDNISolone (SOLU-MEDROL) injection  60 mg Intravenous Q12H  . pantoprazole  40 mg Oral Daily  . predniSONE  10 mg Oral Q breakfast  . sildenafil  20 mg Oral TID   Continuous Infusions:    LOS: 2 days    Time spent: 30 minutes.     Kathlen Mody, MD [facility] Pager [phone] If 7PM-7AM, please contact night-coverage [email or web address] Password [facility] 05/10/2015, 5:28 PM

## 2015-05-10 NOTE — Progress Notes (Signed)
Patient having expiratory wheezing and labored breathing.  Gave breathing treatment and notified patient's RN

## 2015-05-10 NOTE — Progress Notes (Signed)
Asked to see patient for a "second set of eyes" for labored breathing.  Upon my arrival to patients room, Daughter at bedside.  Patient is in bed with ventimask, lethargic arouses to voice.  Breath Sounds bilateral crackles with mild wheeze, RR 24, patient appears dyspneic but not labored. MD has been updated and orders received.  No RRT intervention at this time.  RN to call if assistance needed

## 2015-05-10 NOTE — Progress Notes (Signed)
Patient having audible expiratory wheezing and appears to be using accessory muscles to breathe; patient resting currently however, did notify RT to administer PRN breathing treatment to help with wheezing.

## 2015-05-10 NOTE — Progress Notes (Signed)
Patient seems to be breathing more comfortable. Will continue to monitor

## 2015-05-10 NOTE — Telephone Encounter (Signed)
Opened in error. Closing encounter.

## 2015-05-10 NOTE — Progress Notes (Signed)
Patient son was able to demonstrate knowledge of trach care and suctioning.

## 2015-05-10 NOTE — Progress Notes (Signed)
PULMONARY / CRITICAL CARE MEDICINE   Name: Miranda Hines MRN: [id number] DOB: [birth year]    ADMISSION DATE:  05/08/2015  REFERRING MD:  ER  CHIEF COMPLAINT:  Short of breath  SUMMARY:   75 y/o female with hx of ILD and pulmonary hypertension has progressive shortness of breath.  She has been followed by Dr. [name] with orthopedics for T12 and L1 compression fractures.  She was placed in a brace that she is to wear during the day.  She reports this makes her breathing extremely difficulty, and she feels like she can't take a deep breath in.  She also has not been taking her lasix recently because it is too painful for her to walk to go the bathroom.  She had prednisone dose increased recently as outpt and this didn't help.  She has been given neb tx w/o any difference.  She normally wears 3 to 4 liters oxygen at home.  She feels her breathing is okay when she is at rest and not wearing back brace.  She denies chest pain, sputum, fever, nausea, or abdominal pain.  Her leg swelling has gotten much worse.  SUBJECTIVE: Pain remains an issue    VITAL SIGNS: BP 99/58 mmHg  Pulse 97  Temp(Src) 99 F (37.2 C) (Oral)  Resp 22  Ht 5\' 2"  (1.575 m)  Wt 220 lb 3.8 oz (99.9 kg)  BMI 40.27 kg/m2  SpO2 100%  INTAKE / OUTPUT: I/O last 3 completed shifts: In: 480 [P.O.:480] Out: 600 [Urine:600]  PHYSICAL EXAMINATION: General:  Elderly female in NAD lying in bed Neuro:  Alert, normal strength, moves all extremities, CN intact HEENT:  Pupils reactive, moon facies, no stridor, speaks in full sentences  Cardiovascular:  Regular, tachycardic, no murmur Lungs:  Dyspneic but not labored, B/l crackles, no wheeze.mild increase wob Abdomen:  Soft, non tender, +bowel sounds Musculoskeletal:  3+ edema Skin:  No rashes or lesions  LABS:  BMET  Recent Labs Lab 05/08/15 0825 05/09/15 0442 05/10/15 0646  NA 142 139 139  K 4.0 3.8 3.5  CL 95* 87* 84*  CO2 36* 41* 45*  BUN 18 15 18    CREATININE 0.56 0.56 0.59  GLUCOSE 90 102* 101*    Electrolytes  Recent Labs Lab 05/08/15 0825 05/09/15 0442 05/10/15 0646  CALCIUM 8.9 8.6* 8.4*    CBC  Recent Labs Lab 05/08/15 0825 05/09/15 0442  WBC 7.6 7.0  HGB 10.6* 10.1*  HCT 34.0* 32.1*  PLT 149* 160    Imaging Dg Chest Port 1 View  05/10/2015  CLINICAL DATA:  Acute onset of dyspnea.  Initial encounter. EXAM: PORTABLE CHEST 1 VIEW COMPARISON:  Chest radiograph from 05/08/2015 FINDINGS: The lungs are hypoexpanded. Patchy bilateral airspace opacification raises concern for pulmonary edema, though pneumonia could have a similar appearance. A small left pleural effusion is noted. No pneumothorax is seen. The cardiomediastinal silhouette is borderline normal in size. No acute osseous abnormalities are identified. IMPRESSION: Lungs hypoexpanded. Patchy bilateral airspace opacification raises concern for pulmonary edema, though pneumonia could have a similar appearance. Small left pleural effusion noted. Electronically Signed   By: Roanna RaiderJeffery  Chang M.D.   On: 05/10/2015 03:55     STUDIES:  12/13/12  PFT >> FEV1 0.91, (44%), FEV1% 99, TLC 2.18 (45%), DLCO 42% 10/21/14  Echo >> EF 60 to 65%, mild LVH, grade 1 diastolic CHF 11/08/14  RHC >> RA 4, RV 40/1, PA 45/19 mean 32, PCWP 8, CI 2.15, PVR 6.5 WU 11/15/14  HRCT  chest >> patchy GGO, btx with random distribution, air trapping 04/23/15  MRI lumbar spine >> mod T12 and severe L1 compression fx  SIGNIFICANT EVENTS: 4/26  Admit 4/27  Remains on 15L  4/29 55% fio2  DISCUSSION: 75 y/o female with progressive dyspnea related to severe back pain with lung restriction from wearing brace in setting of ILD.  She also has hypervolemia related to inability to take outpt lasix, due to inability to go to the bathroom because of severe back pain.  ASSESSMENT / PLAN:  Dyspnea. ILD >> process undefined; negative serology from October 2016. Acute on chronic hypoxic respiratory  failure. Pulmonary hypertension. Chronic diastolic CHF.  Plan: - oxygen to keep SpO2 90 to 95%, currently 55 % vm - prn duonebs  - lasix 40 mg IV daily - prednisone 10 mg daily - continue revatio, letaris  T12, L1 compression fx's. - appreciate orthopedics input - do not feel she is a surgical candidate at this point with increased O2 needs and hypervolemia. - hydrocodone, dilaudid PRN  Hx of GERD. - continue protonix, pepcid  Goals of care >> Full Code DVT prophylaxis >> Lovenox  Updated patient at bedside on plan of care.    PCCM will continue to follow for pulmonary needs.     Brett Canales Maleigh Bagot ACNP [facility] Pager [phone] till 3 pm If no answer page [phone] 05/10/2015, 10:28 AM

## 2015-05-11 LAB — BASIC METABOLIC PANEL
Anion gap: 8 (ref 5–15)
BUN: 13 mg/dL (ref 6–20)
CALCIUM: 8.4 mg/dL — AB (ref 8.9–10.3)
CO2: 49 mmol/L — AB (ref 22–32)
CREATININE: 0.49 mg/dL (ref 0.44–1.00)
Chloride: 79 mmol/L — ABNORMAL LOW (ref 101–111)
GFR calc non Af Amer: >60 mL/min (ref >60)
Glucose, Bld: 123 mg/dL — ABNORMAL HIGH (ref 65–99)
Potassium: 3.9 mmol/L (ref 3.5–5.1)
SODIUM: 136 mmol/L (ref 135–145)

## 2015-05-11 MED ORDER — POTASSIUM CHLORIDE CRYS ER 20 MEQ PO TBCR
20.0000 meq | EXTENDED_RELEASE_TABLET | Freq: Every day | ORAL | Status: AC
  Administered 2015-05-11 – 2015-05-12 (×2): 20 meq via ORAL
  Filled 2015-05-11 (×2): qty 1

## 2015-05-11 NOTE — Progress Notes (Signed)
PULMONARY / CRITICAL CARE MEDICINE   Name: Miranda Hines MRN: [id number] DOB: 04/25/1940    ADMISSION DATE:  05/08/2015  REFERRING MD:  ER  CHIEF COMPLAINT:  Short of breath  SUMMARY:   75 y/o female with hx of ILD and pulmonary hypertension has progressive shortness of breath.  She has been followed by Dr. Yevette Edwards with orthopedics for T12 and L1 compression fractures.  She was placed in a brace that she is to wear during the day.  She reports this makes her breathing extremely difficulty, and she feels like she can't take a deep breath in.  She also has not been taking her lasix recently because it is too painful for her to walk to go the bathroom.  She had prednisone dose increased recently as outpt and this didn't help.  She has been given neb tx w/o any difference.  She normally wears 3 to 4 liters oxygen at home.  She feels her breathing is okay when she is at rest and not wearing back brace.  She denies chest pain, sputum, fever, nausea, or abdominal pain.  Her leg swelling has gotten much worse.  SUBJECTIVE:  Patient feels better, pain control improved, breathing easier, swelling improved  VITAL SIGNS: BP 113/51 mmHg  Pulse 96  Temp(Src) 97.9 F (36.6 C) (Oral)  Resp 18  Ht  (1.575 m)  Wt 216 lb 11.4 oz (98.3 kg)  BMI 39.63 kg/m2  SpO2 92%  INTAKE / OUTPUT: I/O last 3 completed shifts: In: 480 [P.O.:480] Out: 2650 [Urine:2650]  PHYSICAL EXAMINATION: General:  Elderly female in NAD lying in bed Neuro:  Alert, normal strength, moves all extremities, CN intact HEENT:  Pupils reactive, moon facies, no stridor, speaks in full sentences  Cardiovascular:  Regular, tachycardic, no murmur Lungs:  Dyspneic but not labored, B/l crackles, no wheeze.mild increase wob Abdomen:  Soft, non tender, +bowel sounds Musculoskeletal:  3+ edema Skin:  No rashes or lesions  LABS:  BMET  Recent Labs Lab 05/09/15 0442 05/10/15 0646 05/11/15 0655  NA 139 139 136  K 3.8 3.5  3.9  CL 87* 84* 79*  CO2 41* 45* 49*  BUN CREATININE 0.56 0.59 0.49  GLUCOSE 102* 101* 123*    Electrolytes  Recent Labs Lab 05/09/15 0442 05/10/15 0646 05/11/15 0655  CALCIUM 8.6* 8.4* 8.4*    CBC  Recent Labs Lab 05/08/15 0825 05/09/15 0442  WBC 7.6 7.0  HGB 10.6* 10.1*  HCT 34.0* 32.1*  PLT 149* 160    Imaging No results found.   STUDIES:  12/13/12  PFT >> FEV1 0.91, (44%), FEV1% 99, TLC 2.18 (45%), DLCO 42% 10/21/14  Echo >> EF 60 to 65%, mild LVH, grade 1 diastolic CHF 11/08/14  RHC >> RA 4, RV 40/1, PA 45/19 mean 32, PCWP 8, CI 2.15, PVR 6.5 WU 11/15/14  HRCT chest >> patchy GGO, btx with random distribution, air trapping 04/23/15  MRI lumbar spine >> mod T12 and severe L1 compression fx  SIGNIFICANT EVENTS: 4/26  Admit 4/27  Remains on 15L  4/29 55% fio2  DISCUSSION: 75 y/o female with progressive dyspnea related to severe back pain with lung restriction from wearing brace in setting of ILD.  She also has hypervolemia related to inability to take outpt lasix, due to inability to go to the bathroom because of severe back pain.  ASSESSMENT / PLAN:  Dyspnea. ILD >> process undefined; negative serology from October 2016.  Baseline 10 mg Prednisone dependent  Acute on chronic hypoxic respiratory failure. Pulmonary hypertension. Chronic diastolic CHF.  Plan: - oxygen to keep SpO2 90 to 95%, currently 55 % vm.  Will trial high flow for patient comfort with mask - prn duonebs  - lasix 40 mg IV daily - baseline prednisone 10 mg daily, on hold with solumedrol - Solumedrol 60 mg BID  - continue revatio, letaris - pending palliative care for overall goals of care   T12, L1 compression fx's. - appreciate orthopedics input - do not feel she is a surgical candidate at this point with increased O2 needs and hypervolemia. - hydrocodone, dilaudid PRN  Hx of GERD. - continue protonix, pepcid  Goals of care >> Full Code DVT prophylaxis >>  Lovenox  Updated patient at bedside on plan of care.    PCCM will continue to follow for pulmonary needs.  She looks much better 4/29 overall.  We will see again on Monday 5/1.    Canary BrimBrandi Ollis, NP-C [facility] Pgr: [phone] or if no answer [phone] 05/11/2015, 5:33 PM   Attending note: I have seen and examined the patient with nurse practitioner/resident and agree with the note. History, labs and imaging reviewed.  74 Y/O with ILD,r esp failure. Admitted with T12, L1 compression fracture. Looks improved today. Transitioned to [place]. No resp distress. Continue pred at current dose, revatio and letaris. Palliative care consult.  Chilton GreathousePraveen Sherrell Farish MD [facility] Pager [phone] If no answer or after 3pm call: [phone] 05/11/2015, 7:08 PM

## 2015-05-11 NOTE — Progress Notes (Signed)
Placed Pt on High Flow [place] at 10 L. Sp02 = 94. Pt comfortable.

## 2015-05-11 NOTE — Progress Notes (Signed)
PT Cancellation Note  Patient Details Name: Miranda Hines MRN: [id number] DOB: 1940/07/03   Cancelled Treatment:    Reason Eval/Treat Not Completed: Medical issues which prohibited therapy. Pt remains on bed rest. Per RN pt needing a kyphoplasty due to T12 and L1 compression fractures. Pt currently on venti mask at 15Lo2 or 55%. PT to await updated activity orders to OOB prior to evaluation. Palliative consult also noted.   Marcene BrawnChadwell, Taegen Lennox Marie 05/11/2015, 8:36 AM   Lewis ShockAshly Jacalynn Buzzell, PT, DPT Pager #: [phone] Office #: [phone]

## 2015-05-11 NOTE — Care Management Important Message (Signed)
Important Message  Patient Details  Name: Miranda Hines MRN: [id number] Date of Birth: 11/20/1940   Medicare Important Message Given:  Yes    Antony HasteBennett, Cassandra Harbold Harris, RN 05/11/2015, 11:18 AM

## 2015-05-11 NOTE — Progress Notes (Signed)
PROGRESS NOTE    Sahmya D Dobies  ZOX:[id number] DOB: 01/17/1940 DOA: 05/08/2015 PCP: Cain Saupe, MD ) Outpatient Specialists: Dr Yevette Edwards with orthopedics.     Brief Narrative: 75 yo female with hx of ILD and pulmonary hypertension presents with  progressive shortness of breath. Recently had back surgery and is on brace which has limited her breathing.  She is currently on nrb and lasix for diuresis. Overnight and earlier today she had diffuse bronchospasm and we started her on steroids.  Palliative care consulted for goals of care.   Assessment & Plan:   Active Problems:   Dyspnea  Acute on chronic respiratory failure from fluid over load from acute on chronic diastolic heart failure:  ILD; On tele and high flow oxygen 50% .  She was initially admitted by [facility] and transferred to med service on 4/ 27.  Resume daily lasix for diuresis and monitor creatinine and electrolytes.  Keep sats greater than 90% She was wheezing on 4/28 and was started onsolumedrol 60  Mg q12hr and she reports improvement .  CXR on 4/29 showed hypo expanded lungs and patchy bilateral air space opacification suggestive for pulmonary edema. Extra dose of lasix given.  Changed the lasix to q12h.   Compression fracture of the T12 and L1: Pain control and brace to be used as per orthopedics.    Anemia: Normocytic monitor.    DVT prophylaxis: lovenox.  Code Status: full  Family Communication: none  at bedside.  Disposition Plan: pending further diuresis.    Consultants:   PCCM   Procedures: none   Antimicrobials: none   Subjective: Reports feeling sob.   Objective: Filed Vitals:   05/10/15 2123 05/11/15 0410 05/11/15 1400 05/11/15 1821  BP: 117/58 120/57 113/51   Pulse: 105 87 96   Temp: 98.4 F (36.9 C) 97.7 F (36.5 C) 97.9 F (36.6 C)   TempSrc: Oral Oral Oral   Resp: Height:      Weight:  98.3 kg (216 lb 11.4 oz)    SpO2: 96% 100% 92% 94%    Intake/Output  Summary (Last 24 hours) at 05/11/15 1905 Last data filed at 05/11/15 1300  Gross per 24 hour  Intake    240 ml  Output   3200 ml  Net  -2960 ml   Filed Weights   05/09/15 0620 05/10/15 0414 05/11/15 0410  Weight: 99.7 kg (219 lb 12.8 oz) 99.9 kg (220 lb 3.8 oz) 98.3 kg (216 lb 11.4 oz)    Examination:  General exam: Appears anxious.  Respiratory system: diminished at bases no wheezing. Scattered crackles, on high flow oxygen.  Cardiovascular system: S1 & S2 heard,  Gastrointestinal system: Abdomen is nondistended, soft and nontender. No organomegaly or masses felt. Normal bowel sounds heard. Central nervous system: Alert and oriented. No focal neurological deficits. Extremities: Symmetric 5 x 5 power.bilateral  Skin: No rashes, lesions or ulcers Psychiatry: Judgement and insight appear normal. Mood & affect appropriate.     Data Reviewed: I have personally reviewed following labs and imaging studies  CBC:  Recent Labs Lab 05/08/15 0825 05/09/15 0442  WBC 7.6 7.0  HGB 10.6* 10.1*  HCT 34.0* 32.1*  MCV 99.7 98.5  PLT 149* 160   Basic Metabolic Panel:  Recent Labs Lab 05/08/15 0825 05/09/15 0442 05/10/15 0646 05/11/15 0655  NA 142 139 139 136  K 4.0 3.8 3.5 3.9  CL 95* 87* 84* 79*  CO2 36* 41* 45* 49*  GLUCOSE 90  102* 101* 123*  BUN 18 15 18 13   CREATININE 0.56 0.56 0.59 0.49  CALCIUM 8.9 8.6* 8.4* 8.4*   GFR: Estimated Creatinine Clearance: 69.6 mL/min (by C-G formula based on Cr of 0.49). Liver Function Tests: No results for input(s): AST, ALT, ALKPHOS, BILITOT, PROT, ALBUMIN in the last 168 hours. No results for input(s): LIPASE, AMYLASE in the last 168 hours. No results for input(s): AMMONIA in the last 168 hours. Coagulation Profile: No results for input(s): INR, PROTIME in the last 168 hours. Cardiac Enzymes: No results for input(s): CKTOTAL, CKMB, CKMBINDEX, TROPONINI in the last 168 hours. BNP (last 3 results) No results for input(s): PROBNP in  the last 8760 hours. HbA1C: No results for input(s): HGBA1C in the last 72 hours. CBG: No results for input(s): GLUCAP in the last 168 hours. Lipid Profile: No results for input(s): CHOL, HDL, LDLCALC, TRIG, CHOLHDL, LDLDIRECT in the last 72 hours. Thyroid Function Tests: No results for input(s): TSH, T4TOTAL, FREET4, T3FREE, THYROIDAB in the last 72 hours. Anemia Panel: No results for input(s): VITAMINB12, FOLATE, FERRITIN, TIBC, IRON, RETICCTPCT in the last 72 hours. Urine analysis:    Component Value Date/Time   COLORURINE YELLOW 10/20/2014 1705   APPEARANCEUR CLOUDY* 10/20/2014 1705   LABSPEC 1.006 10/20/2014 1705   PHURINE 8.0 10/20/2014 1705   GLUCOSEU NEGATIVE 10/20/2014 1705   HGBUR NEGATIVE 10/20/2014 1705   BILIRUBINUR NEGATIVE 10/20/2014 1705   KETONESUR NEGATIVE 10/20/2014 1705   PROTEINUR NEGATIVE 10/20/2014 1705   UROBILINOGEN 1.0 10/20/2014 1705   NITRITE NEGATIVE 10/20/2014 1705   LEUKOCYTESUR NEGATIVE 10/20/2014 1705   Sepsis Labs: @LABRCNTIP (procalcitonin:4,lacticidven:4)  )No results found for this or any previous visit (from the past 240 hour(s)).       Radiology Studies: Dg Chest Port 1 View  05/10/2015  CLINICAL DATA:  Acute onset of dyspnea.  Initial encounter. EXAM: PORTABLE CHEST 1 VIEW COMPARISON:  Chest radiograph from 05/08/2015 FINDINGS: The lungs are hypoexpanded. Patchy bilateral airspace opacification raises concern for pulmonary edema, though pneumonia could have a similar appearance. A small left pleural effusion is noted. No pneumothorax is seen. The cardiomediastinal silhouette is borderline normal in size. No acute osseous abnormalities are identified. IMPRESSION: Lungs hypoexpanded. Patchy bilateral airspace opacification raises concern for pulmonary edema, though pneumonia could have a similar appearance. Small left pleural effusion noted. Electronically Signed   By: Roanna RaiderJeffery  Chang M.D.   On: 05/10/2015 03:55        Scheduled  Meds: . ambrisentan  5 mg Oral Daily  . antiseptic oral rinse  7 mL Mouth Rinse BID  . aspirin  81 mg Oral Daily  . enoxaparin (LOVENOX) injection  40 mg Subcutaneous Q24H  . famotidine  20 mg Oral QHS  . fluticasone  2 spray Each Nare Daily  . furosemide  40 mg Intravenous Q12H  . methylPREDNISolone (SOLU-MEDROL) injection  60 mg Intravenous Q12H  . pantoprazole  40 mg Oral Daily  . potassium chloride  20 mEq Oral Daily  . sildenafil  20 mg Oral TID   Continuous Infusions:    LOS: 3 days    Time spent: 30 minutes.     Kathlen ModyAKULA,Peace Jost, MD [facility] Pager [phone] If 7PM-7AM, please contact night-coverage [email or web address] Password [email or web address] 05/11/2015, 7:05 PM

## 2015-05-12 ENCOUNTER — Inpatient Hospital Stay (HOSPITAL_COMMUNITY): Payer: Medicare Other

## 2015-05-12 LAB — BASIC METABOLIC PANEL
BUN: 17 mg/dL (ref 6–20)
CALCIUM: 8.9 mg/dL (ref 8.9–10.3)
CO2: >50 mmol/L — ABNORMAL HIGH (ref 22–32)
Chloride: 79 mmol/L — ABNORMAL LOW (ref 101–111)
Creatinine, Ser: 0.42 mg/dL — ABNORMAL LOW (ref 0.44–1.00)
GFR calc non Af Amer: >60 mL/min (ref >60)
GLUCOSE: 137 mg/dL — AB (ref 65–99)
POTASSIUM: 3.9 mmol/L (ref 3.5–5.1)
Sodium: 139 mmol/L (ref 135–145)

## 2015-05-12 MED ORDER — CALCITONIN (SALMON) 200 UNIT/ACT NA SOLN
1.0000 | Freq: Every day | NASAL | Status: AC
  Administered 2015-05-12 – 2015-05-16 (×3): 1 via NASAL
  Filled 2015-05-12 (×2): qty 3.7

## 2015-05-12 MED ORDER — CALCITONIN (SALMON) 200 UNIT/ACT NA SOLN: 1.0000 | Freq: Every day | NASAL | Status: DC

## 2015-05-12 MED ORDER — FUROSEMIDE 10 MG/ML IJ SOLN
40.0000 mg | Freq: Every day | INTRAMUSCULAR | Status: DC
  Administered 2015-05-12 – 2015-05-13 (×2): 40 mg via INTRAVENOUS
  Filled 2015-05-12 (×3): qty 4

## 2015-05-12 NOTE — Progress Notes (Signed)
PT Cancellation Note  Patient Details Name: Miranda Hines MRN: [id number] DOB: [birth year]   Cancelled Treatment:    Reason Eval/Treat Not Completed: Medical issues which prohibited therapy   [name] continues to be on Bedrest, and noted critical value for CO2;  In order to proceed with PT eval, will need increased activity orders;   Thank you,  Van ClinesHolly Teagan Ozawa, PT  [facility] Pager [phone] Office [phone]    Van ClinesGarrigan, Kayla Deshaies [facility] LLCamff 05/12/2015, 2:00 PM

## 2015-05-12 NOTE — Progress Notes (Signed)
PROGRESS NOTE    Miranda Hines  WUJ:[id number] DOB: 1940-04-02 DOA: 05/08/2015 PCP: Cain Saupe, MD ) Outpatient Specialists: Dr Yevette Edwards with orthopedics.     Brief Narrative: 75 yo female with hx of ILD and pulmonary hypertension presents with  progressive shortness of breath. Recently had back surgery and is on brace which has limited her breathing.  She is currently on nrb and lasix for diuresis. Overnight and earlier today she had diffuse bronchospasm and we started her on steroids.  Palliative care consulted for goals of care.   Assessment & Plan:   Active Problems:   Dyspnea  Acute on chronic respiratory failure from fluid over load from acute on chronic diastolic heart failure:  ILD; On Tele and transitioned to high flow nasal [place]ula oxygen from 50% She was initially admitted by [facility] and transferred to med service on 4/ 27.  Resume daily lasix for diuresis and monitor creatinine and electrolytes.  Keep sats greater than 90% She was wheezing on 4/28 and was started onsolumedrol 60  Mg q12hr and she reports improvement .  CXR on 4/29 and 4/30 showed hypo expanded lungs and patchy bilateral air space opacification suggestive for pulmonary edema. Extra dose of lasix given.    Compression fracture of the T12 and L1: Pain control and brace to be used as per orthopedics.  She reports the brace is not helpful and it 's worsening her breathing.  Will discuss with orthopedics.   Metabolic alkalosis: decreased the dose of lasix to daily    Anemia: Normocytic monitor.    In view of her worsening respiratory status and deconditioning, requested palliative care consult.   DVT prophylaxis: lovenox.  Code Status: full  Family Communication: none  at bedside.  Disposition Plan: pending further diuresis.    Consultants:   PCCM   Procedures: none   Antimicrobials: none   Subjective: Reports feeling sob.   Objective: Filed Vitals:   05/12/15 0421 05/12/15  0523 05/12/15 0901 05/12/15 1413  BP:  101/58  109/50  Pulse:  88 91 89  Temp:  97.6 F (36.4 C)  97.3 F (36.3 C)  TempSrc:  Oral  Oral  Resp:  Height:      Weight: 95.9 kg (211 lb 6.7 oz)     SpO2:  100% 100% 97%    Intake/Output Summary (Last 24 hours) at 05/12/15 1845 Last data filed at 05/12/15 1500  Gross per 24 hour  Intake    480 ml  Output   1350 ml  Net   -870 ml   Filed Weights   05/10/15 0414 05/11/15 0410 05/12/15 0421  Weight: 99.9 kg (220 lb 3.8 oz) 98.3 kg (216 lb 11.4 oz) 95.9 kg (211 lb 6.7 oz)    Examination:  General exam: Appears anxious.  Respiratory system: diminished at bases no wheezing. Scattered crackles, on high flow oxygen.  Cardiovascular system: S1 & S2 heard,  Gastrointestinal system: Abdomen is nondistended, soft and nontender. No organomegaly or masses felt. Normal bowel sounds heard. Central nervous system: Alert and oriented. No focal neurological deficits. Extremities: Symmetric 5 x 5 power.bilateral  Skin: No rashes, lesions or ulcers     Data Reviewed: I have personally reviewed following labs and imaging studies  CBC:  Recent Labs Lab 05/08/15 0825 05/09/15 0442  WBC 7.6 7.0  HGB 10.6* 10.1*  HCT 34.0* 32.1*  MCV 99.7 98.5  PLT 149* 160   Basic Metabolic Panel:  Recent Labs Lab 05/08/15 0825  05/09/15 0442 05/10/15 0646 05/11/15 0655 05/12/15 0732  NA 142 139 139 136 139  K 4.0 3.8 3.5 3.9 3.9  CL 95* 87* 84* 79* 79*  CO2 36* 41* 45* 49* >50*  GLUCOSE 90 102* 101* 123* 137*  BUN 18 15 18 13 17   CREATININE 0.56 0.56 0.59 0.49 0.42*  CALCIUM 8.9 8.6* 8.4* 8.4* 8.9   GFR: Estimated Creatinine Clearance: 68.6 mL/min (by C-G formula based on Cr of 0.42). Liver Function Tests: No results for input(s): AST, ALT, ALKPHOS, BILITOT, PROT, ALBUMIN in the last 168 hours. No results for input(s): LIPASE, AMYLASE in the last 168 hours. No results for input(s): AMMONIA in the last 168 hours. Coagulation  Profile: No results for input(s): INR, PROTIME in the last 168 hours. Cardiac Enzymes: No results for input(s): CKTOTAL, CKMB, CKMBINDEX, TROPONINI in the last 168 hours. BNP (last 3 results) No results for input(s): PROBNP in the last 8760 hours. HbA1C: No results for input(s): HGBA1C in the last 72 hours. CBG: No results for input(s): GLUCAP in the last 168 hours. Lipid Profile: No results for input(s): CHOL, HDL, LDLCALC, TRIG, CHOLHDL, LDLDIRECT in the last 72 hours. Thyroid Function Tests: No results for input(s): TSH, T4TOTAL, FREET4, T3FREE, THYROIDAB in the last 72 hours. Anemia Panel: No results for input(s): VITAMINB12, FOLATE, FERRITIN, TIBC, IRON, RETICCTPCT in the last 72 hours. Urine analysis:    Component Value Date/Time   COLORURINE YELLOW 10/20/2014 1705   APPEARANCEUR CLOUDY* 10/20/2014 1705   LABSPEC 1.006 10/20/2014 1705   PHURINE 8.0 10/20/2014 1705   GLUCOSEU NEGATIVE 10/20/2014 1705   HGBUR NEGATIVE 10/20/2014 1705   BILIRUBINUR NEGATIVE 10/20/2014 1705   KETONESUR NEGATIVE 10/20/2014 1705   PROTEINUR NEGATIVE 10/20/2014 1705   UROBILINOGEN 1.0 10/20/2014 1705   NITRITE NEGATIVE 10/20/2014 1705   LEUKOCYTESUR NEGATIVE 10/20/2014 1705   Sepsis Labs: @LABRCNTIP (procalcitonin:4,lacticidven:4)  )No results found for this or any previous visit (from the past 240 hour(s)).       Radiology Studies: Dg Chest Port 1 View  05/12/2015  CLINICAL DATA:  Shortness of breath. EXAM: PORTABLE CHEST 1 VIEW COMPARISON:  May 10, 2015. FINDINGS: Stable cardiomediastinal silhouette. Slightly increased bilateral diffuse lung opacities are noted most consistent with pulmonary edema or possibly pneumonia. No pneumothorax is noted. Mild left pleural effusion cannot be excluded. Bony thorax is unremarkable. IMPRESSION: Slightly increased bilateral lung opacities are noted concerning for worsening edema or pneumonia. Electronically Signed   By: Lupita RaiderJames  Green Jr, M.D.   On:  05/12/2015 14:47        Scheduled Meds: . ambrisentan  5 mg Oral Daily  . antiseptic oral rinse  7 mL Mouth Rinse BID  . aspirin  81 mg Oral Daily  . calcitonin (salmon)  1 spray Alternating Nares Daily  . enoxaparin (LOVENOX) injection  40 mg Subcutaneous Q24H  . famotidine  20 mg Oral QHS  . fluticasone  2 spray Each Nare Daily  . furosemide  40 mg Intravenous Daily  . methylPREDNISolone (SOLU-MEDROL) injection  60 mg Intravenous Q12H  . pantoprazole  40 mg Oral Daily  . sildenafil  20 mg Oral TID   Continuous Infusions:    LOS: 4 days    Time spent: 30 minutes.     Kathlen ModyAKULA,Shirly Bartosiewicz, MD [facility] Pager [phone] If 7PM-7AM, please contact night-coverage [email or web address] Password [email or web address] 05/12/2015, 6:45 PM

## 2015-05-12 NOTE — Progress Notes (Signed)
PT was id using MRN, name and DOB

## 2015-05-12 NOTE — Progress Notes (Signed)
CRITICAL VALUE ALERT  Critical value received:  CO2  Date of notification:  05/12/15  Time of notification:  0911  Critical value read back:Yes.    Nurse who received alert:  Erick AlleySarah Donivan Thammavong RN  MD notified (1st page):  Dr. Blake DivineAkula  Time of first page:  [phone]  Responding MD:  Dr. Blake DivineAkula  Time MD responded:  [phone]

## 2015-05-12 NOTE — Consult Note (Signed)
Consultation Note Date: 05/12/2015   Patient Name: Miranda Hines  DOB: 02-18-1940  MRN: [id number]  Age / Sex: 75 y.o., female  PCP: Cain Saupe, MD Referring Physician: Kathlen Mody, MD  Reason for Consultation: Establishing goals of care and Pain control  HPI/Patient Profile: 75 y.o. female  with past medical history of Interstitial lung disease, pulmonary hypertension and progressive shortness of breath admitted on 05/08/2015 with with pain and worsening shortness of breath secondary to new finding off compression fracture at T12 and L1. She was seen by orthopedics who recommended a brace, but she states she's been unable to wear the brace because she is unable to breathe with it. She has been noncompliant with her Lasix because her pain has been so severe she has not been able to get up and go to the bathroom.   Clinical Assessment and Goals of Care: Patient tells me this morning she just received some pain medicines and she does look rather groggy. She is attempting to speak with me but her focus is on her back pain and whether she will be able to receive kyphoplasty. I have seen patient in November 2016 for goals of care related to her end-stage interstitial lung disease. Patient has been having difficulty making these decisions and one of her main reasons is that she has a caregiver of her husband. She describes herself as tough, but this pain "has lately low". At this point in time it's difficult to move her into a conversation regarding goals of care related to her pulmonary disease  NEXT OF KIN which is her son if she is unable to make decisions for herself    SUMMARY OF RECOMMENDATIONS   Continue with evaluation to see if patient is a kyphoplasty candidate; if she has this would be advantageous since we would avoid sedating medicines such as opioids which could further compromise her respiratory  status. If not recommend referral to outpatient orthopedics or an interventional pain specialist No changes to current goals of care. Palliative medicine team to look for opportunity to reengage with patient once her pain is better managed We'll adjust her pain medicines Code Status/Advance Care Planning:  Full code    Symptom Management:   Pain: Continue with Vicodin 1 tab for mild to moderate pain, Dilaudid IV 0.5 mg for moderate to severe pain, and we'll add Mia calcitonin nasal spray for bone pain. She is also on steroids for her interstitial lung disease and an episode of bronchospasm last night. She is verbalizing that her pain is improved since admission to the hospital  Dyspnea: Continue with targeted pulmonary treatments such as oxygen via nasal cannula nebulizer treatments. Opioid education provided on relief of dyspnea for end-stage lung disease. Encouraged patient to assess not only the pain medicines efficacy in treating her back pain but also her shortness of breath  Palliative Prophylaxis:   Aspiration, Bowel Regimen, Delirium Protocol, Frequent Pain Assessment and Turn Reposition  Additional Recommendations (Limitations, Scope, Preferences):  Full Scope Treatment  Psycho-social/Spiritual:   Desire  for further Chaplaincy support:no   Prognosis:   Unable to determine  Discharge Planning: Home with Home Health      Primary Diagnoses: Present on Admission:  . Dyspnea  I have reviewed the medical record, interviewed the patient and family, and examined the patient. The following aspects are pertinent.  Past Medical History  Diagnosis Date  . Interstitial lung disease (HCC)   . IPF (idiopathic pulmonary fibrosis) (HCC)   . Pulmonary hypertension (HCC)   . Chronic respiratory failure with hypoxia (HCC)   . Respiratory failure with hypoxia (HCC) 04/2015  . Compression fracture 04/2015   Social History   Social History  . Marital Status: Married    Spouse  Name: N/A  . Number of Children: N/A  . Years of Education: N/A   Social History Main Topics  . Smoking status: Passive Smoke Exposure - Never Smoker  . Smokeless tobacco: Never Used  . Alcohol Use: No  . Drug Use: No  . Sexual Activity: Not Asked   Other Topics Concern  . None   Social History Narrative   Family History  Problem Relation Age of Onset  . Kidney failure Mother   . COPD Mother   . COPD Brother    Scheduled Meds: . ambrisentan  5 mg Oral Daily  . antiseptic oral rinse  7 mL Mouth Rinse BID  . aspirin  81 mg Oral Daily  . calcitonin (salmon)  1 spray Alternating Nares Daily  . enoxaparin (LOVENOX) injection  40 mg Subcutaneous Q24H  . famotidine  20 mg Oral QHS  . fluticasone  2 spray Each Nare Daily  . furosemide  40 mg Intravenous Daily  . methylPREDNISolone (SOLU-MEDROL) injection  60 mg Intravenous Q12H  . pantoprazole  40 mg Oral Daily  . sildenafil  20 mg Oral TID   Continuous Infusions:  PRN Meds:.sodium chloride, ALPRAZolam, guaiFENesin, HYDROcodone-acetaminophen, HYDROmorphone (DILAUDID) injection, ibuprofen, ipratropium-albuterol, tiZANidine, traMADol Medications Prior to Admission:  Prior to Admission medications   Medication Sig Start Date End Date Taking? Authorizing Provider  ambrisentan (LETAIRIS) 5 MG tablet Take 1 tablet (5 mg total) by mouth daily. 01/29/15  Yes Kalman Shan, MD  aspirin 81 MG tablet Take 81 mg by mouth daily.   Yes Historical Provider, MD  famotidine (PEPCID) 20 MG tablet Take 1 tablet (20 mg total) by mouth at bedtime. 06/04/14  Yes Nyoka Cowden, MD  fluticasone (FLONASE) 50 MCG/ACT nasal spray Place 2 sprays into both nostrils daily. 02/13/15  Yes Kalman Shan, MD  furosemide (LASIX) 40 MG tablet Take 1 tablet (40 mg total) by mouth daily. 01/29/15  Yes Kalman Shan, MD  guaiFENesin (MUCINEX) 600 MG 12 hr tablet Take 1,200 mg by mouth as needed for congestion.   Yes Historical Provider, MD    HYDROcodone-acetaminophen (NORCO/VICODIN) 5-325 MG tablet Take 1 tablet by mouth every 6 (six) hours as needed for moderate pain.   Yes Historical Provider, MD  ibuprofen (ADVIL,MOTRIN) 800 MG tablet Take 800 mg by mouth 2 (two) times daily as needed. pain 04/03/15  Yes Historical Provider, MD  ipratropium-albuterol (DUONEB) 0.5-2.5 (3) MG/3ML SOLN Take 3 mLs by nebulization 3 (three) times daily. 11/21/14  Yes William S Minor, NP  pantoprazole (PROTONIX) 40 MG tablet TAKE 1 TABLET BY MOUTH 30 TO 60 MINUTES BEFORE FIRST MEAL OF THE DAY 02/13/15  Yes Kalman Shan, MD  potassium chloride (K-DUR) 10 MEQ tablet Take 1 tablet (10 mEq total) by mouth daily. 01/29/15  Yes Kalman Shan, MD  predniSONE (DELTASONE) 10 MG tablet 30 mg tablets daily Patient taking differently: Take 30 mg by mouth daily. 30 mg tablets daily 03/18/15  Yes Kalman Shan, MD  Saline (SIMPLY SALINE) 0.9 % AERS Place 1 spray into the nose daily as needed (allergies).    Yes Historical Provider, MD  sildenafil (REVATIO) 20 MG tablet Take 1 tablet (20 mg total) by mouth 3 (three) times daily. 11/13/14  Yes Lupita Leash, MD  SSD 1 % cream Apply 1 application topically daily. 05/01/15  Yes Historical Provider, MD  tiZANidine (ZANAFLEX) 2 MG tablet Take 2-4 mg by mouth every 8 (eight) hours as needed for muscle spasms.   Yes Historical Provider, MD  traMADol (ULTRAM) 50 MG tablet Take 1 tablet (50 mg total) by mouth every 8 (eight) hours as needed for moderate pain. Patient not taking: Reported on 05/08/2015 03/29/15   Kalman Shan, MD   Allergies  Allergen Reactions  . Penicillins Swelling   Review of Systems  Constitutional: Positive for activity change and appetite change.  HENT: Negative.   Respiratory: Positive for cough, shortness of breath and wheezing.   Cardiovascular: Positive for chest pain and palpitations.  Endocrine: Negative.   Genitourinary: Positive for urgency.  Musculoskeletal: Positive for back pain.   Allergic/Immunologic: Negative.   Neurological: Positive for weakness.  Hematological: Negative.   Psychiatric/Behavioral: Negative.     Physical Exam  Constitutional: She is oriented to person, place, and time. She appears well-nourished.  Older visibly SHOB at rest  HENT:  Head: Normocephalic and atraumatic.  Eyes:  Periorbita edema  Cardiovascular:  irrg  Pulmonary/Chest:  Increased work of breathing with conversation and eating  Musculoskeletal: Normal range of motion.  Neurological: She is alert and oriented to person, place, and time.  Skin: Skin is warm.  Psychiatric: She has a normal mood and affect. Her behavior is normal. Judgment and thought content normal.  Nursing note and vitals reviewed.   Vital Signs: BP 101/58 mmHg  Pulse 89  Temp(Src) 97.6 F (36.4 C) (Oral)  Resp 20  Ht 5\' 2"  (1.575 m)  Wt 95.9 kg (211 lb 6.7 oz)  BMI 38.66 kg/m2  SpO2 97% Pain Assessment: 0-10 POSS *See Group Information*: S-Acceptable,Sleep, easy to arouse Pain Score: 8    SpO2: SpO2: 97 % O2 Device:SpO2: 97 % O2 Flow Rate: .O2 Flow Rate (L/min): 10 L/min  IO: Intake/output summary:  Intake/Output Summary (Last 24 hours) at 05/12/15 1416 Last data filed at 05/12/15 0456  Gross per 24 hour  Intake      0 ml  Output   1100 ml  Net  -1100 ml    LBM: Last BM Date: 05/08/15 Baseline Weight: Weight: 88.451 kg (195 lb) Most recent weight: Weight: 95.9 kg (211 lb 6.7 oz)     Palliative Assessment/Data:   Flowsheet Rows        Most Recent Value   Intake Tab    Referral Department  Hospitalist   Unit at Time of Referral  Med/Surg Unit   Palliative Care Primary Diagnosis  Pulmonary   Date Notified  05/10/15   Palliative Care Type  Return patient Palliative Care   Reason for referral  Clarify Goals of Care   Date of Admission  05/08/15   Date first seen by Palliative Care  05/12/15   # of days Palliative referral response time  2 Day(s)   # of days IP prior to Palliative  referral  2   Clinical Assessment  Palliative Performance Scale Score  30%   Pain Max last 24 hours  8   Pain Min Last 24 hours  5   Dyspnea Max Last 24 Hours  7   Dyspnea Min Last 24 hours  4   Nausea Max Last 24 Hours  0   Nausea Min Last 24 Hours  0   Anxiety Max Last 24 Hours  0   Anxiety Min Last 24 Hours  0   Psychosocial & Spiritual Assessment    Palliative Care Outcomes       Time In: 0900 Time Out: 1000 Time Total: 60 min Greater than 50%  of this time was spent counseling and coordinating care related to the above assessment and plan. Staffed with Dr. Blake DivineAkula  Signed by: Irean HongSarah Grace Briaunna Grindstaff, NP   Please contact Palliative Medicine Team phone at [phone] for questions and concerns.  For individual provider: See Loretha StaplerAmion

## 2015-05-13 DIAGNOSIS — Z515 Encounter for palliative care: Secondary | ICD-10-CM

## 2015-05-13 DIAGNOSIS — J9601 Acute respiratory failure with hypoxia: Secondary | ICD-10-CM

## 2015-05-13 DIAGNOSIS — Z789 Other specified health status: Secondary | ICD-10-CM

## 2015-05-13 LAB — BASIC METABOLIC PANEL
BUN: 22 mg/dL — AB (ref 6–20)
CHLORIDE: 73 mmol/L — AB (ref 101–111)
CREATININE: 0.37 mg/dL — AB (ref 0.44–1.00)
Calcium: 8.8 mg/dL — ABNORMAL LOW (ref 8.9–10.3)
GFR calc Af Amer: >60 mL/min (ref >60)
GFR calc non Af Amer: >60 mL/min (ref >60)
GLUCOSE: 145 mg/dL — AB (ref 65–99)
Potassium: 4.2 mmol/L (ref 3.5–5.1)
SODIUM: 137 mmol/L (ref 135–145)

## 2015-05-13 MED ORDER — FUROSEMIDE 10 MG/ML IJ SOLN: 40.0000 mg | Freq: Two times a day (BID) | INTRAMUSCULAR | Status: DC

## 2015-05-13 MED ORDER — MORPHINE SULFATE (CONCENTRATE) 10 MG/0.5ML PO SOLN
5.0000 mg | Freq: Three times a day (TID) | ORAL | Status: DC
  Administered 2015-05-13 – 2015-05-17 (×11): 5 mg via ORAL
  Filled 2015-05-13 (×11): qty 0.5

## 2015-05-13 MED ORDER — TRAZODONE HCL 50 MG PO TABS: 50.0000 mg | ORAL_TABLET | Freq: Every evening | ORAL | Status: DC | PRN

## 2015-05-13 MED ORDER — ALPRAZOLAM 0.5 MG PO TABS
0.5000 mg | ORAL_TABLET | ORAL | Status: DC | PRN
  Administered 2015-05-14 – 2015-05-15 (×4): 0.5 mg via ORAL
  Filled 2015-05-13 (×4): qty 1

## 2015-05-13 MED ORDER — FENTANYL CITRATE (PF) 100 MCG/2ML IJ SOLN
12.5000 ug | INTRAMUSCULAR | Status: DC | PRN
Start: 2015-05-13 — End: 2015-05-15
  Administered 2015-05-14 (×2): 12.5 ug via INTRAVENOUS
  Filled 2015-05-13 (×2): qty 2

## 2015-05-13 MED ORDER — ACETAMINOPHEN 500 MG PO TABS
1000.0000 mg | ORAL_TABLET | Freq: Four times a day (QID) | ORAL | Status: DC
  Administered 2015-05-13 – 2015-05-17 (×8): 1000 mg via ORAL
  Filled 2015-05-13 (×10): qty 2

## 2015-05-13 MED ORDER — FUROSEMIDE 10 MG/ML IJ SOLN
40.0000 mg | Freq: Two times a day (BID) | INTRAMUSCULAR | Status: AC
  Administered 2015-05-13 – 2015-05-14 (×2): 40 mg via INTRAVENOUS
  Filled 2015-05-13 (×2): qty 4

## 2015-05-13 NOTE — Progress Notes (Signed)
MD notified that patient is complaining of pain with inspiration/expiration accompanied by SOB.  Respiratory therapist reports hearing crackles and wheezes.  PRN duoneb given.  MD ordered to give lasix dose early.  Will continue to monitor patient.

## 2015-05-13 NOTE — Progress Notes (Signed)
PULMONARY / CRITICAL CARE MEDICINE   Name: Miranda Hines MRN: [id number] DOB: 12-31-1940    ADMISSION DATE:  05/08/2015  REFERRING MD:  ER  CHIEF COMPLAINT:  Short of breath  SUMMARY:   75 y/o female with hx of ILD and pulmonary hypertension has progressive shortness of breath.  She has been followed by Dr. Yevette Edwards with orthopedics for T12 and L1 compression fractures.  She was placed in a brace that she is to wear during the day.  She reports this makes her breathing extremely difficulty, and she feels like she can't take a deep breath in.  She also has not been taking her lasix recently because it is too painful for her to walk to go the bathroom.  She had prednisone dose increased recently as outpt and this didn't help.  She has been given neb tx w/o any difference.  She normally wears 3 to 4 liters oxygen at home.  She feels her breathing is okay when she is at rest and not wearing back brace.  She denies chest pain, sputum, fever, nausea, or abdominal pain.  Her leg swelling has gotten much worse.  SUBJECTIVE:  Delirium this AM. Unable to describe if pain/breathing better. On 10L HFNC breathing limited by back pain.   VITAL SIGNS: BP 140/65 mmHg  Pulse 89  Temp(Src) 97.9 F (36.6 C) (Oral)  Resp 20  Ht  (1.575 m)  Wt 97.6 kg (215 lb 2.7 oz)  BMI 39.34 kg/m2  SpO2 97%  INTAKE / OUTPUT: I/O last 3 completed shifts: In: 480 [P.O.:480] Out: 2675 [Urine:2675]  PHYSICAL EXAMINATION: General:  Elderly female in NAD lying in bed Neuro:  Alert, normal strength, moves all extremities, some confusion today.  HEENT:  Pupils reactive, moon face, no stridor, speaks in fragmented sentences  Cardiovascular:  Regular, tachycardic, no murmur Lungs:  Dyspneic but not labored, B/l crackles, no wheeze.mild increase wob Abdomen:  Soft, non tender, +bowel sounds Musculoskeletal:  3+ edema Skin:  No rashes or lesions  LABS:  BMET  Recent Labs Lab 05/11/15 0655 05/12/15 0732  05/13/15 0638  NA 136 139 137  K 3.9 3.9 4.2  CL 79* 79* 73*  CO2 49* >50* >50*  BUN 13 17 22*  CREATININE 0.49 0.42* 0.37*  GLUCOSE 123* 137* 145*    Electrolytes  Recent Labs Lab 05/11/15 0655 05/12/15 0732 05/13/15 0638  CALCIUM 8.4* 8.9 8.8*    CBC  Recent Labs Lab 05/08/15 0825 05/09/15 0442  WBC 7.6 7.0  HGB 10.6* 10.1*  HCT 34.0* 32.1*  PLT 149* 160    Imaging Dg Chest Port 1 View  05/12/2015  CLINICAL DATA:  Shortness of breath. EXAM: PORTABLE CHEST 1 VIEW COMPARISON:  May 10, 2015. FINDINGS: Stable cardiomediastinal silhouette. Slightly increased bilateral diffuse lung opacities are noted most consistent with pulmonary edema or possibly pneumonia. No pneumothorax is noted. Mild left pleural effusion cannot be excluded. Bony thorax is unremarkable. IMPRESSION: Slightly increased bilateral lung opacities are noted concerning for worsening edema or pneumonia. Electronically Signed   By: Lupita Raider, M.D.   On: 05/12/2015 14:47     STUDIES:  12/13/12  PFT >> FEV1 0.91, (44%), FEV1% 99, TLC 2.18 (45%), DLCO 42% 10/21/14  Echo >> EF 60 to 65%, mild LVH, grade 1 diastolic CHF 11/08/14  RHC >> RA 4, RV 40/1, PA 45/19 mean 32, PCWP 8, CI 2.15, PVR 6.5 WU 11/15/14  HRCT chest >> patchy GGO, btx with random distribution, air trapping  04/23/15  MRI lumbar spine >> mod T12 and severe L1 compression fx  SIGNIFICANT EVENTS: 4/26  Admit 4/27  Remains on 15L  4/29 55% fio2 5/1 10L HFNC  DISCUSSION: 75 y/o female with progressive dyspnea related to severe back pain with lung restriction from wearing brace in setting of ILD.  She also has hypervolemia related to inability to take outpt lasix, due to inability to go to the bathroom because of severe back pain.  ASSESSMENT / PLAN:  Dyspnea. ILD >> process undefined; negative serology from October 2016.  Baseline 10 mg Prednisone dependent  Acute on chronic hypoxic respiratory failure. (on 3-4L [place] at  home) Pulmonary hypertension. Chronic diastolic CHF.  Plan: - wean oxygen as tolerated to keep SpO2 90 to 95%, currently 10L HFNC - prn duonebs  - lasix 40 mg IV daily, still room for diuresis - baseline prednisone 10 mg daily, on hold with solumedrol - Solumedrol 60 mg BID  - continue revatio, letaris - careful pain management.  T12, L1 compression fx's. - appreciate orthopedics input - likely not a surgical candidate at this point with increased O2 needs and hypervolemia. - hydrocodone, dilaudid PRN  Hx of GERD. - continue protonix, pepcid  Goals of care >> Full Code, GOC discussed with palliative 4/30, she was unable to make any decisions. Will need to ontinue these discussion as she is not a good candidate for aggressive interventions should she decompensate. ILD is end stage.  DVT prophylaxis >> Lovenox  Updated patient at bedside on plan of care.    Joneen RoachPaul Hoffman, AGACNP-BC [facility] Pager [phone] or [phone]  05/13/2015 11:56 AM  Attending Note:  75 year old female with ES-IPF at this point.  On steroids.  Multiple compression fracture.  Unable to perform kyphoplasty due to her respiratory status.  Recommended brace.  Unable to wear brace due to respiratory status.  Screaming in pain on exam.  CXR much worse.  Worsening pulmonary edema.  O2 demand increased from 4 to 10 liters.  Called daughter.  Came in and we spoke at length and reviewed CXR.  Significant worsening.  Continues to be DNR at this point.  Will meet with the entire family tomorrow at 11 AM and recommend DNR status and more comfort measures.  In the meantime, will make BiPAP available and give lasix to assist with pulmonary edema.  The patient is critically ill with multiple organ systems failure and requires high complexity decision making for assessment and support, frequent evaluation and titration of therapies, application of advanced monitoring technologies and extensive  interpretation of multiple databases.   Critical Care Time devoted to patient care services described in this note is  35  Minutes. This time reflects time of care of this signee Dr Koren BoundWesam Janina Trafton. This critical care time does not reflect procedure time, or teaching time or supervisory time of PA/NP/Med student/Med Resident etc but could involve care discussion time.  Alyson ReedyWesam G. Trequan Marsolek, M.D. [facility]. Pager: [phone]. After hours pager: [phone].

## 2015-05-13 NOTE — Progress Notes (Signed)
PROGRESS NOTE    Miranda Hines  ZOX:[id number] DOB: November 11, 1940 DOA: 05/08/2015 PCP: Cain SaupeFULP, [name], MD ) Outpatient Specialists: Dr [name] with orthopedics.     Brief Narrative: 75 yo female with hx of ILD and pulmonary hypertension presents with  progressive shortness of breath. Recently had back surgery and is on brace which has limited her breathing.  She is currently on nrb and lasix for diuresis. Overnight and earlier today she had diffuse bronchospasm and we started her on steroids.  Palliative care consulted for goals of care.   Assessment & Plan:   Active Problems:   Dyspnea  Acute on chronic respiratory failure from fluid over load from acute on chronic diastolic heart failure:  ILD; On Tele and transitioned to high flow nasal canula oxygen from 50% [facility] on 4/ 27.  Resume daily lasix for diuresis and monitor creatinine and electrolytes.  Keep sats greater than 90% She was wheezing on 4/28 and was started onsolumedrol 60  Mg q12hr and she reports improvement .  CXR on 4/29 and 4/30 showed hypo expanded lungs and patchy bilateral air space opacification suggestive for pulmonary edema. Extra dose of lasix given.    Compression fracture of the T12 and L1: Pain control and brace to be used as per orthopedics.  She reports the brace is not helpful and it 's worsening her breathing.  Will discuss with orthopedics.   Metabolic alkalosis: decreased the dose of lasix to daily    Anemia: Normocytic monitor.    In view of her worsening respiratory status and deconditioning, requested palliative care consult.  She is currently FULL code.   DVT prophylaxis: lovenox.  Code Status: full  Family Communication: discussed with family  at bedside.  Disposition Plan: pending further diuresis.    Consultants:   PCCM   Procedures: none   Antimicrobials: none   Subjective: Reports feelingslightly better  than yesterday.   Objective: Filed Vitals:   05/13/15 0500 05/13/15 1031 05/13/15 1449 05/13/15 1536  BP:    114/47  Pulse:  89 85 74  Temp:    97.8 F (36.6 C)  TempSrc:    Oral  Resp:  20 18   Height:      Weight: 97.6 kg (215 lb 2.7 oz)     SpO2:  97% 99% 53%    Intake/Output Summary (Last 24 hours) at 05/13/15 1824 Last data filed at 05/13/15 0518  Gross per 24 hour  Intake      0 ml  Output   1325 ml  Net  -1325 ml   Filed Weights   05/11/15 0410 05/12/15 0421 05/13/15 0500  Weight: 98.3 kg (216 lb 11.4 oz) 95.9 kg (211 lb 6.7 oz) 97.6 kg (215 lb 2.7 oz)    Examination:  General exam: Appears anxious.  Respiratory system: diminished at bases no wheezing. Scattered crackles, on high flow oxygen.  Cardiovascular system: S1 & S2 heard, 3+ pedal edema. Gastrointestinal system: Abdomen is nondistended, soft and nontender. No organomegaly or masses felt. Normal bowel sounds heard. Central nervous system: Alert and oriented. No focal neurological deficits. Extremities: Symmetric 5 x 5 power.bilateral  Skin: No rashes, lesions or ulcers     Data Reviewed: I have personally reviewed following labs and imaging studies  CBC:  Recent Labs Lab 05/08/15 0825 05/09/15 0442  WBC 7.6 7.0  HGB 10.6* 10.1*  HCT 34.0* 32.1*  MCV 99.7 98.5  PLT 149* 160  Basic Metabolic Panel:  Recent Labs Lab 05/09/15 0442 05/10/15 0646 05/11/15 0655 05/12/15 0732 05/13/15 0638  NA 139 139 136 139 137  K 3.8 3.5 3.9 3.9 4.2  CL 87* 84* 79* 79* 73*  CO2 41* 45* 49* >50* >50*  GLUCOSE 102* 101* 123* 137* 145*  BUN 22*  CREATININE 0.56 0.59 0.49 0.42* 0.37*  CALCIUM 8.6* 8.4* 8.4* 8.9 8.8*   GFR: Estimated Creatinine Clearance: 69.3 mL/min (by C-G formula based on Cr of 0.37). Liver Function Tests: No results for input(s): AST, ALT, ALKPHOS, BILITOT, PROT, ALBUMIN in the last 168 hours. No results for input(s): LIPASE, AMYLASE in the last 168 hours. No results  for input(s): AMMONIA in the last 168 hours. Coagulation Profile: No results for input(s): INR, PROTIME in the last 168 hours. Cardiac Enzymes: No results for input(s): CKTOTAL, CKMB, CKMBINDEX, TROPONINI in the last 168 hours. BNP (last 3 results) No results for input(s): PROBNP in the last 8760 hours. HbA1C: No results for input(s): HGBA1C in the last 72 hours. CBG: No results for input(s): GLUCAP in the last 168 hours. Lipid Profile: No results for input(s): CHOL, HDL, LDLCALC, TRIG, CHOLHDL, LDLDIRECT in the last 72 hours. Thyroid Function Tests: No results for input(s): TSH, T4TOTAL, FREET4, T3FREE, THYROIDAB in the last 72 hours. Anemia Panel: No results for input(s): VITAMINB12, FOLATE, FERRITIN, TIBC, IRON, RETICCTPCT in the last 72 hours. Urine analysis:    Component Value Date/Time   COLORURINE YELLOW 10/20/2014 1705   APPEARANCEUR CLOUDY* 10/20/2014 1705   LABSPEC 1.006 10/20/2014 1705   PHURINE 8.0 10/20/2014 1705   GLUCOSEU NEGATIVE 10/20/2014 1705   HGBUR NEGATIVE 10/20/2014 1705   BILIRUBINUR NEGATIVE 10/20/2014 1705   KETONESUR NEGATIVE 10/20/2014 1705   PROTEINUR NEGATIVE 10/20/2014 1705   UROBILINOGEN 1.0 10/20/2014 1705   NITRITE NEGATIVE 10/20/2014 1705   LEUKOCYTESUR NEGATIVE 10/20/2014 1705   Sepsis Labs: (procalcitonin:4,lacticidven:4)  )No results found for this or any previous visit (from the past 240 hour(s)).       Radiology Studies: Dg Chest Port 1 View  05/12/2015  CLINICAL DATA:  Shortness of breath. EXAM: PORTABLE CHEST 1 VIEW COMPARISON:  May 10, 2015. FINDINGS: Stable cardiomediastinal silhouette. Slightly increased bilateral diffuse lung opacities are noted most consistent with pulmonary edema or possibly pneumonia. No pneumothorax is noted. Mild left pleural effusion cannot be excluded. Bony thorax is unremarkable. IMPRESSION: Slightly increased bilateral lung opacities are noted concerning for worsening edema or pneumonia.  Electronically Signed   By: Lupita Raider, M.D.   On: 05/12/2015 14:47        Scheduled Meds: . ambrisentan  5 mg Oral Daily  . antiseptic oral rinse  7 mL Mouth Rinse BID  . aspirin  81 mg Oral Daily  . calcitonin (salmon)  1 spray Alternating Nares Daily  . enoxaparin (LOVENOX) injection  40 mg Subcutaneous Q24H  . famotidine  20 mg Oral QHS  . fluticasone  2 spray Each Nare Daily  . furosemide  40 mg Intravenous Q12H  . methylPREDNISolone (SOLU-MEDROL) injection  60 mg Intravenous Q12H  . pantoprazole  40 mg Oral Daily  . sildenafil  20 mg Oral TID   Continuous Infusions:    LOS: 5 days    Time spent: 30 minutes.     Kathlen Mody, MD [facility] Pager [phone] If 7PM-7AM, please contact night-coverage [email or web address] Password [facility] 05/13/2015, 6:24 PM

## 2015-05-13 NOTE — Progress Notes (Signed)
PT Cancellation Note  Patient Details Name: Miranda Hines MRN: [id number] DOB: 07-01-1940   Cancelled Treatment:    Reason Eval/Treat Not Completed: Medical issues which prohibited therapy. Pt remains on bed rest and con't to desat while on high flow O2 during any movement. PT canceling order. Please re-order when Bed rest order lifted. Thanks!    Marcene BrawnChadwell, Devonte Migues Marie 05/13/2015, 8:06 AM  Lewis ShockAshly Samir Ishaq, PT, DPT Pager #: [phone] Office #: [phone]

## 2015-05-13 NOTE — Telephone Encounter (Signed)
pls let patient know that I so  So wanted to drop in and see her in hospital and I came there on 2 nights but I just could not make it to bed because it was non stop busy till 2am or so and it was too late. Also let her know that  I met sister in law in [place]

## 2015-05-13 NOTE — Progress Notes (Signed)
I had meeting with Miranda Hines's family, she has a very large and involved family, Her husband is also very ill with history of multiple strokes and requires full care by family. Her condition is deteriorating since admission. 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 She is opiate sensitive with propensity for hypercarbia. She is on pulmonary PDE5-I so she has very advanced disease. Her prognosis is very poor. I prepared her family for all possible trajectories including not surviving this admission.  -Family embraces comfort care and understand the approach -They want her medications for PH to continue because theyu help symptoms, continue steroids for symptoms, support her O2 requirements. - NOW DNR, no bipap- O2 titration only  We discussed hospice care- home vs. Hospice facility - will begin managing her symptoms and will meet again with family to help with that transition. For tonight we can stop telemetry and begin to focus on comfort- her room is too small for her large family and she is having anxiety and dyspnea with poor airflow-requested bed on 6N.  Stopped current pain regimen. Will use IV fentanyl 12.5 q2 PRN and schedule a very small dose of SL morphine.  Anderson MaltaElizabeth Jaylyne Breese, DO Palliative Medicine [phone]  Time: 60 minutes  7:30-815PM Greater than 50%  of this time was spent counseling and coordinating care related to the above assessment and plan.

## 2015-05-13 NOTE — Progress Notes (Signed)
Reassessed pt's pain at 6AM this morning after giving Norco. Pt stated her pain level went from a 5 to a 2 on the 0-10 pain scale. Pt continued to say she was in pain and began stating that she was anxious. RN went to med room and got a PRN order of 0.25mg  tablet of Xanax for the pt. On return to the pt's room, RN asked pt if she would like to take the Xanax for her anxiety. Pt told the RN that she did not want the Xanax at this time. Pt then closed her eyes to go to sleep. RN will continue to monitor and treat per MD orders.

## 2015-05-14 ENCOUNTER — Ambulatory Visit: Payer: Medicare Other | Admitting: Internal Medicine

## 2015-05-14 DIAGNOSIS — J84112 Idiopathic pulmonary fibrosis: Secondary | ICD-10-CM

## 2015-05-14 DIAGNOSIS — I272 Other secondary pulmonary hypertension: Secondary | ICD-10-CM

## 2015-05-14 DIAGNOSIS — M549 Dorsalgia, unspecified: Secondary | ICD-10-CM

## 2015-05-14 NOTE — Progress Notes (Signed)
Received patient from 5W accompanied by daughter. Patient sleeping but response to voice. VS T98.7 oral, PR 84, RR 18, BP 104/45 and O2Sat at 100% on 10L/min via [place].  5W RN earlier reported not to titrate O2 per RT since patient tends to desat.  Patient resting on bed comfortably with both eyes close.  Daughter oriented on room, bed controls, and call light since daughter is staying for the night.

## 2015-05-14 NOTE — Progress Notes (Signed)
Transferred pt.to room 6N11 via bed with daughter .

## 2015-05-14 NOTE — Care Management Important Message (Signed)
Important Message  Patient Details  Name: Miranda Hines MRN: [id number] Date of Birth: Oct 04, 1940   Medicare Important Message Given:  Yes    Orson AloeMegan P Eamonn Sermeno 05/14/2015, 1:34 PM

## 2015-05-14 NOTE — Progress Notes (Addendum)
PULMONARY / CRITICAL CARE MEDICINE   Name: Miranda Hines MRN: [id number] DOB: 1940/04/25    ADMISSION DATE:  05/08/2015  REFERRING MD:  ER  CHIEF COMPLAINT:  Short of breath  SUMMARY:   75 y/o female with hx of ILD and pulmonary hypertension has progressive shortness of breath.  She has been followed by Dr. Lynann Bologna with orthopedics for T12 and L1 compression fractures.  She was placed in a brace that she is to wear during the day.  She reports this makes her breathing extremely difficulty, and she feels like she can't take a deep breath in.  She also has not been taking her lasix recently because it is too painful for her to walk to go the bathroom.  She had prednisone dose increased recently as outpt and this didn't help.  She has been given neb tx w/o any difference.  She normally wears 3 to 4 liters oxygen at home.  She feels her breathing is okay when she is at rest and not wearing back brace.  She denies chest pain, sputum, fever, nausea, or abdominal pain.  Her leg swelling has gotten much worse.  SUBJECTIVE:  Family met with palliative yesterday. Miranda Hines has been made DNR and comfort care. She was ordered low dose IV fentanyl and SL morphine. Feels much better here today, and breathing more comfortably. Pain improved. Looks like and entirely different person and seems happy.   VITAL SIGNS: BP 118/53 mmHg  Pulse 85  Temp(Src) 97.8 F (36.6 C) (Oral)  Resp 20  Ht _0  (1.575 m)  Wt 94.3 kg (207 lb 14.3 oz)  BMI 38.01 kg/m2  SpO2 98%  INTAKE / OUTPUT: I/O last 3 completed shifts: In: 120 [P.O.:120] Out: 2850 [Urine:2850]  PHYSICAL EXAMINATION: General:  Elderly female in NAD lying in bed Neuro:  Alert, normal strength, moves all extremities, some confusion today.  HEENT:  Pupils reactive, moon face, no stridor, speaks in fragmented sentences  Cardiovascular:  Regular, tachycardic, no murmur Lungs:  Dyspneic but not labored, B/l crackles, no wheeze.mild  increase wob Abdomen:  Soft, non tender, +bowel sounds Musculoskeletal:  3+ edema Skin:  No rashes or lesions  LABS:  BMET  Recent Labs Lab 05/11/15 0655 05/12/15 0732 05/13/15 0638  NA 136 139 137  K 3.9 3.9 4.2  CL 79* 79* 73*  CO2 49* >50* >50*  BUN 13 17 22*  CREATININE 0.49 0.42* 0.37*  GLUCOSE 123* 137* 145*    Electrolytes  Recent Labs Lab 05/11/15 0655 05/12/15 0732 05/13/15 0638  CALCIUM 8.4* 8.9 8.8*    CBC  Recent Labs Lab 05/08/15 0825 05/09/15 0442  WBC 7.6 7.0  HGB 10.6* 10.1*  HCT 34.0* 32.1*  PLT 149* 160    Imaging No results found.   STUDIES:  12/13/12  PFT >> FEV1 0.91, (44%), FEV1% 99, TLC 2.18 (45%), DLCO 42% 10/21/14  Echo >> EF 60 to 65%, mild LVH, grade 1 diastolic CHF 74/08/14  RHC >> RA 4, RV 40/1, PA 45/19 mean 32, PCWP 8, CI 2.15, PVR 6.5 WU 11/15/14  HRCT chest >> patchy GGO, btx with random distribution, air trapping 04/23/15  MRI lumbar spine >> mod T12 and severe L1 compression fx  SIGNIFICANT EVENTS: 4/26  Admit 4/27  Remains on 15L  4/29 55% fio2 5/1 10L HFNC  DISCUSSION: 75 y/o female with progressive dyspnea related to severe back pain with lung restriction from wearing brace in setting of ILD.  She also has hypervolemia related  to inability to take outpt lasix, due to inability to go to the bathroom because of severe back pain. Symptoms only worsened> comfort care 5/1.   ASSESSMENT / PLAN:  ILD >> process undefined; negative serology from October 2016.  Baseline 10 mg Prednisone dependent  Acute on chronic hypoxic respiratory failure. (on 3-4L [place] at home) Pulmonary hypertension. Chronic diastolic CHF.   Plan: - Now DNR/DNI, comfort care - wean oxygen as tolerated to keep SpO2 90 to 95%, currently 10L HFNC - Continue revatio, letaris, duonebs and PRN duonebs as family thinks this will help alleviate SOB - Wean steroids to home prednisone 21m daily over 2 weeks.  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  Presents with SOB and pain due to compression fractures.  On exam, appears much more comfortable today.  I reviewed CXR myself, diffuse infiltrate noted.  Discussed with PCCM-NP.  Met with family yesterday and subsequently palliative care saw them.  Patient was made DNR and progress with comfort overnight.  Respiratory failure:  - Supplemental O2 for comfort.  - Morphine to assist with SOB.  Back pain:  - No surgery.  - Morphine, recommend progress to drip.  IPF:  - Continue steroids for now, will assist with IPF some since goal is only comfort at this point.  - Continue letaris.  - Continue nebs as ordered again for comfort only.  Pulmonary HTN:  - Continue revatio to prevent acute and sudden decompensation of pulmonary HTN.  PCCM will sign off, please call back if needed.  Patient seen and examined, agree with above note.  I dictated the care and orders written for this patient under my direction.  WRush Farmer MD [phone]

## 2015-05-14 NOTE — Progress Notes (Signed)
Report given to Jackie RN.

## 2015-05-14 NOTE — Progress Notes (Signed)
PROGRESS NOTE    Anuhea D Narayanan  MVH:[id number] DOB: 01/09/1941 DOA: 05/08/2015 PCP: Cain Saupe, MD ) Outpatient Specialists: Dr Yevette Edwards with orthopedics.     Brief Narrative: 75 yo female with hx of ILD and pulmonary hypertension presents with  progressive shortness of breath. Recently had back surgery and is on brace which has limited her breathing.  She is currently on nrb and lasix for diuresis. Overnight and earlier today she had diffuse bronchospasm and we started her on steroids.  Palliative care consulted for goals of care.   Assessment & Plan:   Active Problems:   Dyspnea  Acute on chronic respiratory failure from fluid over load from acute on chronic diastolic heart failure:  ILD; On Tele and transitioned to high flow nasal canula oxygen from 50% She was initially admitted by [facility] and transferred to med service on 4/ 27.  Resume daily lasix for diuresis and symptomatic relief.  She was wheezing on 4/28 and was started onsolumedrol 60 mg q12hr and she reports improvement . Would continue the same.  CXR on 4/29 and 4/30 showed hypo expanded lungs and patchy bilateral air space opacification suggestive for pulmonary edema. Extra doses of lasix can be given as needed.    Compression fracture of the T12 and L1: Pain control and brace to be used as per orthopedics.  She reports the brace is not helpful and it 's worsening her breathing. Would recommend brace to be removed,    Metabolic alkalosis:  nw she is transitioned to comfort care, we will not be checking labs.   Anemia: Normocytic , stable.    In view of her worsening respiratory status and deconditioning, requested palliative care consult.  Appreciate Dr Lamar Blinks help and transition the patient to comfort care Pain regimen managed by palliative.   DVT prophylaxis: lovenox.  Code Status: full  Family Communication: discussed with family  at bedside.  Disposition Plan: pending further diuresis.     Consultants:   PCCM   Procedures: none   Antimicrobials: none   Subjective: Reports feelingslightly better  Multiple family members at bedside.   Objective: Filed Vitals:   05/14/15 0548 05/14/15 0959 05/14/15 1426 05/14/15 1526  BP: 118/53   137/48  Pulse: 85   103  Temp: 97.8 F (36.6 C)   99.3 F (37.4 C)  TempSrc: Oral   Oral  Resp: 20   20  Height:      Weight:      SpO2: 100% 98% 99% 100%    Intake/Output Summary (Last 24 hours) at 05/14/15 1718 Last data filed at 05/14/15 1229  Gross per 24 hour  Intake    120 ml  Output   3045 ml  Net  -2925 ml   Filed Weights   05/12/15 0421 05/13/15 0500 05/14/15 0500  Weight: 95.9 kg (211 lb 6.7 oz) 97.6 kg (215 lb 2.7 oz) 94.3 kg (207 lb 14.3 oz)    Examination:  General exam: Appears anxious.  Respiratory system: diminished at bases no wheezing. Scattered crackles, on high flow oxygen.  Cardiovascular system: S1 & S2 heard, 3+ pedal edema. Gastrointestinal system: Abdomen is nondistended, soft and nontender. No organomegaly or masses felt. Normal bowel sounds heard. Central nervous system: Alert and oriented. No focal neurological deficits. Extremities: Symmetric 5 x 5 power.bilateral  Skin: No rashes, lesions or ulcers     Data Reviewed: I have personally reviewed following labs and imaging studies  CBC:  Recent Labs Lab 05/08/15 0825 05/09/15 0442  WBC 7.6 7.0  HGB 10.6* 10.1*  HCT 34.0* 32.1*  MCV 99.7 98.5  PLT 149* 160   Basic Metabolic Panel:  Recent Labs Lab 05/09/15 0442 05/10/15 0646 05/11/15 0655 05/12/15 0732 05/13/15 0638  NA 139 139 136 139 137  K 3.8 3.5 3.9 3.9 4.2  CL 87* 84* 79* 79* 73*  CO2 41* 45* 49* >50* >50*  GLUCOSE 102* 101* 123* 137* 145*  BUN 15 18 13 17  22*  CREATININE 0.56 0.59 0.49 0.42* 0.37*  CALCIUM 8.6* 8.4* 8.4* 8.9 8.8*   GFR: Estimated Creatinine Clearance: 68 mL/min (by C-G formula based on Cr of 0.37). Liver Function Tests: No results for  input(s): AST, ALT, ALKPHOS, BILITOT, PROT, ALBUMIN in the last 168 hours. No results for input(s): LIPASE, AMYLASE in the last 168 hours. No results for input(s): AMMONIA in the last 168 hours. Coagulation Profile: No results for input(s): INR, PROTIME in the last 168 hours. Cardiac Enzymes: No results for input(s): CKTOTAL, CKMB, CKMBINDEX, TROPONINI in the last 168 hours. BNP (last 3 results) No results for input(s): PROBNP in the last 8760 hours. HbA1C: No results for input(s): HGBA1C in the last 72 hours. CBG: No results for input(s): GLUCAP in the last 168 hours. Lipid Profile: No results for input(s): CHOL, HDL, LDLCALC, TRIG, CHOLHDL, LDLDIRECT in the last 72 hours. Thyroid Function Tests: No results for input(s): TSH, T4TOTAL, FREET4, T3FREE, THYROIDAB in the last 72 hours. Anemia Panel: No results for input(s): VITAMINB12, FOLATE, FERRITIN, TIBC, IRON, RETICCTPCT in the last 72 hours. Urine analysis:    Component Value Date/Time   COLORURINE YELLOW 10/20/2014 1705   APPEARANCEUR CLOUDY* 10/20/2014 1705   LABSPEC 1.006 10/20/2014 1705   PHURINE 8.0 10/20/2014 1705   GLUCOSEU NEGATIVE 10/20/2014 1705   HGBUR NEGATIVE 10/20/2014 1705   BILIRUBINUR NEGATIVE 10/20/2014 1705   KETONESUR NEGATIVE 10/20/2014 1705   PROTEINUR NEGATIVE 10/20/2014 1705   UROBILINOGEN 1.0 10/20/2014 1705   NITRITE NEGATIVE 10/20/2014 1705   LEUKOCYTESUR NEGATIVE 10/20/2014 1705   Sepsis Labs: @LABRCNTIP (procalcitonin:4,lacticidven:4)  )No results found for this or any previous visit (from the past 240 hour(s)).       Radiology Studies: No results found.      Scheduled Meds: . acetaminophen  1,000 mg Oral QID  . ambrisentan  5 mg Oral Daily  . antiseptic oral rinse  7 mL Mouth Rinse BID  . aspirin  81 mg Oral Daily  . calcitonin (salmon)  1 spray Alternating Nares Daily  . enoxaparin (LOVENOX) injection  40 mg Subcutaneous Q24H  . famotidine  20 mg Oral QHS  . fluticasone  2  spray Each Nare Daily  . methylPREDNISolone (SOLU-MEDROL) injection  60 mg Intravenous Q12H  . morphine CONCENTRATE  5 mg Oral TID  . pantoprazole  40 mg Oral Daily  . sildenafil  20 mg Oral TID   Continuous Infusions:    LOS: 6 days    Time spent: 30 minutes.     Kathlen ModyAKULA,Hutchinson Isenberg, MD [facility] Pager [phone] If 7PM-7AM, please contact night-coverage [email or web address] Password [email or web address] 05/14/2015, 5:18 PM

## 2015-05-15 ENCOUNTER — Telehealth: Payer: Self-pay | Admitting: Internal Medicine

## 2015-05-15 DIAGNOSIS — J9612 Chronic respiratory failure with hypercapnia: Secondary | ICD-10-CM

## 2015-05-15 MED ORDER — BISACODYL 10 MG RE SUPP: 10.0000 mg | Freq: Every day | RECTAL | Status: DC | PRN

## 2015-05-15 MED ORDER — FENTANYL CITRATE (PF) 100 MCG/2ML IJ SOLN: 12.5000 ug | INTRAMUSCULAR | Status: DC | PRN

## 2015-05-15 MED ORDER — CALCITONIN (SALMON) 200 UNIT/ACT NA SOLN: 1.0000 | Freq: Every day | NASAL | Status: AC

## 2015-05-15 MED ORDER — TRAZODONE HCL 50 MG PO TABS: 50.0000 mg | ORAL_TABLET | Freq: Every evening | ORAL | Status: AC | PRN

## 2015-05-15 MED ORDER — SIMETHICONE 40 MG/0.6ML PO SUSP
40.0000 mg | Freq: Four times a day (QID) | ORAL | Status: DC | PRN
  Administered 2015-05-15: 40 mg via ORAL
  Filled 2015-05-15 (×3): qty 0.6

## 2015-05-15 MED ORDER — MORPHINE SULFATE (CONCENTRATE) 10 MG/0.5ML PO SOLN: 5.0000 mg | Freq: Three times a day (TID) | ORAL | Status: DC

## 2015-05-15 MED ORDER — BISACODYL 10 MG RE SUPP
10.0000 mg | Freq: Every day | RECTAL | Status: DC | PRN
  Administered 2015-05-16: 10 mg via RECTAL
  Filled 2015-05-15: qty 1

## 2015-05-15 MED ORDER — MORPHINE SULFATE (CONCENTRATE) 10 MG/0.5ML PO SOLN
10.0000 mg | ORAL | Status: DC | PRN
  Administered 2015-05-16 – 2015-05-17 (×2): 10 mg via ORAL
  Filled 2015-05-15 (×2): qty 0.5

## 2015-05-15 MED ORDER — PREDNISONE 50 MG PO TABS
60.0000 mg | ORAL_TABLET | Freq: Every day | ORAL | Status: DC
  Administered 2015-05-16 – 2015-05-17 (×2): 60 mg via ORAL
  Filled 2015-05-15 (×2): qty 1

## 2015-05-15 MED ORDER — ALPRAZOLAM 0.5 MG PO TABS: 0.5000 mg | ORAL_TABLET | ORAL | Status: DC | PRN

## 2015-05-15 MED ORDER — IPRATROPIUM-ALBUTEROL 0.5-2.5 (3) MG/3ML IN SOLN: 3.0000 mL | RESPIRATORY_TRACT | Status: AC | PRN

## 2015-05-15 MED ORDER — MAGNESIUM HYDROXIDE 400 MG/5ML PO SUSP
30.0000 mL | Freq: Every day | ORAL | Status: DC | PRN
  Administered 2015-05-15: 30 mL via ORAL
  Filled 2015-05-15: qty 30

## 2015-05-15 NOTE — Telephone Encounter (Signed)
Yes x all

## 2015-05-15 NOTE — Progress Notes (Signed)
Nutrition Brief Note  Chart reviewed. Pt now transitioning to comfort care.  No further nutrition interventions warranted at this time.  Please re-consult as needed.   Kalayah Leske A. Damiah Mcdonald, RD, LDN, CDE Pager: [phone] After hours Pager: [phone]  

## 2015-05-15 NOTE — Progress Notes (Signed)
RT Note: Once RT became aware that the MD wanted to discontinued the patients high flow nasal cannula, the patients RN was called to ensure that it had been stopped. RN was in another patients room at the time in a procedure so RT came to the unit to check and make sure it had been discontinued from the patient. The RN was already at bedside and had already discontinued it and the patient was on 5lpm nasal cannula and was tolerating it well. Patient appears very comfortable and did not appear to be in any distress. Rt will continue to assist as needed.

## 2015-05-15 NOTE — Discharge Summary (Addendum)
Physician Discharge Summary  Miranda Hines ZOX:[id number] DOB: Aug 17, 1940 DOA: 05/08/2015  PCP: Cain Saupe, MD  Admit date: 05/08/2015 Discharge date: 05/16/2015  Time spent: 30 minutes  Recommendations for Outpatient Follow-up:  1. Follow up with hospice MD AS needed 2. Home with hospice    Discharge Diagnoses:   Acute on chronic respiratory failure from fluid over load from acute on chronic diastolic heart failure Compression fracture of the T12 and L1   anemia   Diet recommendation: COMFORT FEEDS  Filed Weights   05/13/15 0500 05/14/15 0500 05/15/15 0416  Weight: 97.6 kg (215 lb 2.7 oz) 94.3 kg (207 lb 14.3 oz) 92.8 kg (204 lb 9.4 oz)    History of present illness:  75 yo female with hx of ILD and pulmonary hypertension presents with progressive shortness of breath. Recently had back surgery and is on brace which has limited her breathing.   Hospital Course:  Acute on chronic respiratory failure from fluid over load from acute on chronic diastolic heart failure:  ILD; ON [place] oxygen,  She was initially admitted by [facility] and transferred to med service on 4/ 27.  Resume daily lasix for diuresis and symptomatic relief.  She was wheezing on 4/28 and was started onsolumedrol 60 mg q12hr and she reports improvement . Would continue the same.  CXR on 4/29 and 4/30 showed hypo expanded lungs and patchy bilateral air space opacification suggestive for pulmonary edema. Extra doses of lasix can be given as needed.    Compression fracture of the T12 and L1: Pain control and brace to be used as per orthopedics.  She reports the brace is not helpful and it 's worsening her breathing. Would recommend brace to be removed,    Metabolic alkalosis:  nw she is transitioned to comfort care, we will not be checking labs.   Compression fracture of the T12 and L1: Normocytic , stable.    In view of her worsening respiratory status and deconditioning, requested palliative care  consult.  Appreciate Dr Lamar Blinks help and transition the patient to comfort care Pain regimen managed by palliative.   Procedures:  NONE  Consultations:  PCCM  PALLIATIVE CARE  Discharge Exam: Filed Vitals:   05/15/15 0608 05/15/15 0949  BP: 107/69   Pulse: 82 91  Temp: 97.7 F (36.5 C)   Resp: 18 18    General: appears comfortable.    Discharge Instructions    Current Discharge Medication List    START taking these medications   Details  ALPRAZolam (XANAX) 0.5 MG tablet Take 1 tablet (0.5 mg total) by mouth every 4 (four) hours as needed for anxiety. Refills: 0    calcitonin, salmon, (MIACALCIN/FORTICAL) 200 UNIT/ACT nasal spray Place 1 spray into alternate nostrils daily. Qty: 3.7 mL, Refills: 12    fentaNYL (SUBLIMAZE) 100 MCG/2ML injection Inject 0.25 mLs (12.5 mcg total) into the vein every 2 (two) hours as needed for moderate pain. Qty: 2 mL, Refills: 0    Morphine Sulfate (MORPHINE CONCENTRATE) 10 MG/0.5ML SOLN concentrated solution Take 0.25 mLs (5 mg total) by mouth 3 (three) times daily. Qty: 180 mL    traZODone (DESYREL) 50 MG tablet Take 1 tablet (50 mg total) by mouth at bedtime as needed for sleep.      CONTINUE these medications which have CHANGED   Details  ipratropium-albuterol (DUONEB) 0.5-2.5 (3) MG/3ML SOLN Take 3 mLs by nebulization every 2 (two) hours as needed. Qty: 360 mL, Refills: 12      CONTINUE these  medications which have NOT CHANGED   Details  ambrisentan (LETAIRIS) 5 MG tablet Take 1 tablet (5 mg total) by mouth daily. Qty: 30 tablet, Refills: 5    famotidine (PEPCID) 20 MG tablet Take 1 tablet (20 mg total) by mouth at bedtime. Qty: 90 tablet, Refills: 3    fluticasone (FLONASE) 50 MCG/ACT nasal spray Place 2 sprays into both nostrils daily. Qty: 16 g, Refills: 5    furosemide (LASIX) 40 MG tablet Take 1 tablet (40 mg total) by mouth daily. Qty: 30 tablet, Refills: 3    pantoprazole (PROTONIX) 40 MG tablet TAKE 1  TABLET BY MOUTH 30 TO 60 MINUTES BEFORE FIRST MEAL OF THE DAY Qty: 90 tablet, Refills: 2    sildenafil (REVATIO) 20 MG tablet Take 1 tablet (20 mg total) by mouth 3 (three) times daily. Qty: 90 tablet, Refills: 5    SSD 1 % cream Apply 1 application topically daily.    tiZANidine (ZANAFLEX) 2 MG tablet Take 2-4 mg by mouth every 8 (eight) hours as needed for muscle spasms.      STOP taking these medications     aspirin 81 MG tablet      guaiFENesin (MUCINEX) 600 MG 12 hr tablet      HYDROcodone-acetaminophen (NORCO/VICODIN) 5-325 MG tablet      ibuprofen (ADVIL,MOTRIN) 800 MG tablet      potassium chloride (K-DUR) 10 MEQ tablet      predniSONE (DELTASONE) 10 MG tablet      Saline (SIMPLY SALINE) 0.9 % AERS      traMADol (ULTRAM) 50 MG tablet        Allergies  Allergen Reactions  . Penicillins Swelling   Follow-up Information    Follow up with FULP, [name], MD.   Specialty:  Family Medicine   Why:  follow up with hospice MD as needed.    Contact information:   [address] [place] [id number] [phone]        The results of significant diagnostics from this hospitalization (including imaging, microbiology, ancillary and laboratory) are listed below for reference.    Significant Diagnostic Studies: Mr Lumbar Spine Wo Contrast  04/23/2015  CLINICAL DATA:  Low back pain, worse in the past month. EXAM: MRI LUMBAR SPINE WITHOUT CONTRAST TECHNIQUE: Multiplanar, multisequence MR imaging of the lumbar spine was performed. No intravenous contrast was administered. COMPARISON:  Chest CTA 10/20/2014 FINDINGS: For the purposes of this dictation, the lowest well-formed intervertebral disc space is presumed to be L5-S1. There is mild lumbar levoscoliosis with apex at L2-3. There is trace retrolisthesis of L1 on L2, L2 on L3, L3 on L4, and L4 on L5. T12 compression fracture demonstrates approximately 55% height loss centrally and moderate marrow edema. There is also a  severe L1 compression fracture with approximately 80% height loss centrally and associated marrow edema. These levels were incompletely imaged on the prior chest CTA, however both of these compression fractures appear to have occurred in the interim. An L2 superior endplate compression fracture demonstrates 45-50% height loss greater to the left without significant marrow edema. There is diffuse lumbar disc desiccation. Disc space narrowing is moderate at L2-3, L4-5, and L5-S1 and mild at L1-2 and L3-4. Conus medullaris is normal in signal and terminates at L1. A 2.3 cm T2 hyperintense lesion in the upper pole of the left kidney likely represents a cyst. L1-2: Mild disc bulging and endplate spurring without significant stenosis. L2-3: Mild disc bulging and endplate spurring asymmetric to the right  without significant stenosis. L3-4: Mild disc bulging and slight facet hypertrophy result in mild left lateral recess stenosis and minimal right neural foraminal narrowing without spinal stenosis. L4-5: Mild disc bulging and endplate spurring asymmetric to the left and mild-to-moderate left facet and ligamentum flavum hypertrophy result in mild left lateral recess and moderate left neural foraminal stenosis. There is a shallow central disc protrusion without spinal stenosis. L5-S1: Minimal disc bulging, endplate spurring, and mild-to-moderate left facet arthrosis result in mild left neural foraminal stenosis without spinal stenosis. IMPRESSION: 1. Moderate T12 and severe L1 compression fractures with associated marrow edema, acute to subacute. 2. Chronic appearing, mild-to-moderate L2 compression fracture. 3. Multilevel lumbar disc and facet degeneration with mild-to-moderate neural foraminal stenosis as above. Electronically Signed   By: Sebastian Ache M.D.   On: 04/23/2015 16:11   Dg Chest Port 1 View  05/12/2015  CLINICAL DATA:  Shortness of breath. EXAM: PORTABLE CHEST 1 VIEW COMPARISON:  May 10, 2015. FINDINGS:  Stable cardiomediastinal silhouette. Slightly increased bilateral diffuse lung opacities are noted most consistent with pulmonary edema or possibly pneumonia. No pneumothorax is noted. Mild left pleural effusion cannot be excluded. Bony thorax is unremarkable. IMPRESSION: Slightly increased bilateral lung opacities are noted concerning for worsening edema or pneumonia. Electronically Signed   By: Lupita Raider, M.D.   On: 05/12/2015 14:47   Dg Chest Port 1 View  05/10/2015  CLINICAL DATA:  Acute onset of dyspnea.  Initial encounter. EXAM: PORTABLE CHEST 1 VIEW COMPARISON:  Chest radiograph from 05/08/2015 FINDINGS: The lungs are hypoexpanded. Patchy bilateral airspace opacification raises concern for pulmonary edema, though pneumonia could have a similar appearance. A small left pleural effusion is noted. No pneumothorax is seen. The cardiomediastinal silhouette is borderline normal in size. No acute osseous abnormalities are identified. IMPRESSION: Lungs hypoexpanded. Patchy bilateral airspace opacification raises concern for pulmonary edema, though pneumonia could have a similar appearance. Small left pleural effusion noted. Electronically Signed   By: Roanna Raider M.D.   On: 05/10/2015 03:55   Dg Chest Port 1 View  05/08/2015  CLINICAL DATA:  75 year old female with a history of increasing shortness of breath EXAM: PORTABLE CHEST 1 VIEW COMPARISON:  Plain film 11/19/2014, CT 11/15/2014 FINDINGS: Limited plain film given patient positioning and overlying soft tissues of the chest wall/ abdominal wall. Cardiomediastinal silhouette likely unchanged. No visualized pneumothorax. Mixed interstitial and airspace opacities, improved from the plain film of 11/19/2014. IMPRESSION: Limited plain film, with overall improved aeration from the comparison 11/19/2014. Mixed interstitial and airspace opacities bilaterally represents background of patient's known chronic interstitial disease, with superimposed  pneumonitis/ pneumonia difficult to exclude. If there is concern for more sensitive/specific evaluation, a repeat formal PA and lateral chest x-ray may be considered. Signed, Yvone Neu. Loreta Ave, DO [facility] Electronically Signed   By: Gilmer Mor D.O.   On: 05/08/2015 08:43    Microbiology: No results found for this or any previous visit (from the past 240 hour(s)).   Labs: Basic Metabolic Panel:  Recent Labs Lab 05/09/15 0442 05/10/15 0646 05/11/15 0655 05/12/15 0732 05/13/15 0638  NA 139 139 136 139 137  K 3.8 3.5 3.9 3.9 4.2  CL 87* 84* 79* 79* 73*  CO2 41* 45* 49* >50* >50*  GLUCOSE 102* 101* 123* 137* 145*  BUN 15 18 13 17  22*  CREATININE 0.56 0.59 0.49 0.42* 0.37*  CALCIUM 8.6* 8.4* 8.4* 8.9 8.8*   Liver Function Tests: No results for input(s): AST,  ALT, ALKPHOS, BILITOT, PROT, ALBUMIN in the last 168 hours. No results for input(s): LIPASE, AMYLASE in the last 168 hours. No results for input(s): AMMONIA in the last 168 hours. CBC:  Recent Labs Lab 05/09/15 0442  WBC 7.0  HGB 10.1*  HCT 32.1*  MCV 98.5  PLT 160   Cardiac Enzymes: No results for input(s): CKTOTAL, CKMB, CKMBINDEX, TROPONINI in the last 168 hours. BNP: BNP (last 3 results)  Recent Labs  10/20/14 1730 11/13/14 1919 05/08/15 0825  BNP 88.5 105.4* 64.9    ProBNP (last 3 results) No results for input(s): PROBNP in the last 8760 hours.  CBG: No results for input(s): GLUCAP in the last 168 hours.     SignedKathlen Mody MD.  [facility] 05/15/2015, 1:34 PM

## 2015-05-15 NOTE — Progress Notes (Signed)
RT Note: When RT came to assess the patient she was found on a 5lpm high flow nasal cannula with an Spo2 of only 65%. Fio2 was increased to 10lpm via high flow nasal cannula to keep her SpO2 above 90% per MD order. Patient is now saturating 96% on her 10lpm high flow nasal cannula and it will be weaned as patient tolerates. Her RN was called and notified about her low Spo2 and the increase in her oxygen requirements as well as what she is saturating now. Her family is at bedside and they are aware that she is on higher O2 now and they have no questions. Rt will continue to monitor.

## 2015-05-15 NOTE — Care Management Note (Signed)
Case Management Note  Patient Details  Name: Miranda Hines MRN: [id number] Date of Birth: 1940/06/26  Subjective/Objective:   Pt admitted with dyspnea, respiratory failure.  Notified by staff that pt/family are desiring home with Hospice care.                   Action/Plan: Met with pt's daughter, Miranda Hines ([phone]), pt's cousin and sister in law at bedside.  Family interested in getting pt home as soon as possible; spouse is at home.  Daughter given list of Hospice agencies; prefers [facility] [place] [facility] home services.  Referral called in to hospice agency per protocol.  Pt does have hospital bed at home, as well as oxygen provided by [employer].  Daughter states there is lots of other equipment in the home, most which belongs to pt's husband.  Hospice admissions liaison to follow up with daughter later on to discuss equipment needs.    Expected Discharge Date:                  Expected Discharge Plan:  Home w Hospice Care  In-House Referral:     Discharge planning Services  CM Consult  Post Acute Care Choice:    Choice offered to:  Adult Children  DME Arranged:    DME Agency:     HH Arranged:  RN, Social Work [employer] Agency:  [facility] [place] [facility]  Status of Service:  In process, will continue to follow  Medicare Important Message Given:  Yes Date Medicare IM Given:    Medicare IM give by:    Date Additional Medicare IM Given:    Additional Medicare Important Message give by:     If discussed at [place] of Stay Meetings, dates discussed:    Additional Comments:  Reinaldo Raddle, RN, BSN  Trauma/Neuro ICU Case Manager [phone]

## 2015-05-15 NOTE — Progress Notes (Signed)
75 yo with advanced ILD, now approaching EOL.  Met with patient and family. Episode of de-satuation this AM- now requiring 10L of nasal cannula O2. Getting scheduled Roxanol 19m TID -has not required IV fentanyl breakthrough.  Current Symptoms:  1. Dyspnea- Chronic Lung Disease regimen, changed to oral steroids in prep for discharge 2. Pain secondary to acute compression fracture- continue Calcitonin for total of 2 weeks, Roxanol helping along with scheduled Tylenol. 3. Constipation: will give Dulcolax supp 4. Anxiety/Terminal delirium: Alprazolam PRN  Plan:  Home tomorrow AM with [facility] Amb[facility] Transport needed Equipment being delivered 5/3 5-10L Nasal Cannula O2 Scheduled and PRN Roxanol PRN Ativan Nebs  Prognosis: <2 weeks- discussed with family  ELane Hacker DO Palliative Medicine [phone] Time: 1:30-205PM Total 35 min Greater than 50%  of this time was spent counseling and coordinating care related to the above assessment and plan.

## 2015-05-15 NOTE — Telephone Encounter (Signed)
Spoke with [facility].  Pt is currently admitted in the hospital and plans for d/c today or tomorrow.  Hospice received order from hospital for hospice on this pt for acute and chronic resp failure. Would like to know if MR will be pt's hospice attending.  If so, would you like hospice MDs to assist with symptom management.  Please advise.  Thank you.

## 2015-05-15 NOTE — Progress Notes (Signed)
Notified by Raynelle FanningJulie, [facility] of family request [facility] at [facility] after discharge. Chart and patient information currently under review to confirm hospice eligibility.   Spoke with patient and daughter, Babette Relicammy, at bedside to initiate education related to hospice philosophy, services and team approach to care. Family verbalized understanding of the information provided. Per discussion, [facility].   Patient wants to keep F/C at discharge for comfort.  Nursing staff please show family how to empty drainage bag.  Please send signed completed DNR form home with patient.  Patient will need prescriptions for discharge comfort medications.   DME needs discussed and family requested oxygen set-up for 10L and humidifer for delivery to the home today.  [facility] equipment manager Jewel Kizzie BaneHughes notified and will contact [facility] to arrange delivery to the home.  The home address has been verified and is correct in the chart; Caryn BeeKevin is family member to be contacted to arrange time of delivery.   [facility]erral [facility] aware of the above.  Completed discharge summary will need to be [facility]ed to [facility] at [phone] when final.   Please notify [facility] when patient is ready to leave unit at discharge-call [phone].  [facility] information and contact numbers have been given to Tammy during visit.   Please call with any questions.  Thank You,  Hessie KnowsStacie Wilkinson RN, BSN  [facility] Hospital Liaison  [phone]

## 2015-05-15 NOTE — Progress Notes (Addendum)
Improved from a symptom standpoint today- tolerating the scheduled low dose morphine. Plan is home with hospice once everything is set up -hospice RN will need to see patient on day of discharge. Daughter will confirm with rest of family the plan.  Miranda MaltaElizabeth Golding, DO Palliative Medicine

## 2015-05-15 NOTE — Telephone Encounter (Signed)
Spoke with Evette with Hospice and advised that MR will be attending for Hospice and Hospice docs can assist with symptom management.

## 2015-05-16 MED ORDER — ALPRAZOLAM 0.5 MG PO TABS: 0.5000 mg | ORAL_TABLET | ORAL | Status: AC | PRN

## 2015-05-16 MED ORDER — BISACODYL 10 MG RE SUPP
10.0000 mg | Freq: Once | RECTAL | Status: AC
  Administered 2015-05-16: 10 mg via RECTAL
  Filled 2015-05-16: qty 1

## 2015-05-16 MED ORDER — LACTULOSE 10 GM/15ML PO SOLN
20.0000 g | Freq: Once | ORAL | Status: AC
  Administered 2015-05-16: 20 g via ORAL
  Filled 2015-05-16: qty 30

## 2015-05-16 MED ORDER — MORPHINE SULFATE (CONCENTRATE) 10 MG/0.5ML PO SOLN: 10.0000 mg | ORAL | Status: AC | PRN

## 2015-05-16 MED ORDER — PREDNISONE 20 MG PO TABS: 60.0000 mg | ORAL_TABLET | Freq: Every day | ORAL | Status: DC

## 2015-05-16 MED ORDER — SIMETHICONE 40 MG/0.6ML PO SUSP: 40.0000 mg | Freq: Four times a day (QID) | ORAL | Status: AC | PRN

## 2015-05-16 NOTE — Discharge Summary (Addendum)
Physician Discharge Summary  Miranda Hines AVW:[id number] DOB: 1940-06-09 DOA: 05/08/2015  PCP: Cain Saupe, MD  Admit date: 05/08/2015 Discharge date: 05/16/2015  Time spent: 30 minutes  Addendum Patient's daughter [name] would like to look at residential hospice if possible Family is overwhelmed with patient's current condition and would like more support     Recommendations for Outpatient Follow-up:  1. Follow up with hospice MD AS needed 2. Home with hospice    Discharge Diagnoses:   Acute on chronic respiratory failure from fluid over load from acute on chronic diastolic heart failure Compression fracture of the T12 and L1   anemia   Diet recommendation: COMFORT FEEDS  Filed Weights   05/13/15 0500 05/14/15 0500 05/15/15 0416  Weight: 97.6 kg (215 lb 2.7 oz) 94.3 kg (207 lb 14.3 oz) 92.8 kg (204 lb 9.4 oz)    History of present illness:  75 yo female with hx of ILD and pulmonary hypertension has progressive shortness of breath. She has been followed by Dr. Yevette Edwards with orthopedics for T12 and L1 compression fractures. She was placed in a brace that she is to wear during the day. This makes her breathing extremely difficulty, and she feels like she can't take a deep breath in. She also has not been taking her lasix recently because it is too painful for her to walk to go the bathroom. She had prednisone dose increased recently as outpt and this didn't help. She has been given neb tx w/o any difference. She normally wears 3 to 4 liters oxygen at home. now needing  5-10L Nasal Cannula O2  Hospital Course:  Acute on chronic respiratory failure from fluid over load from acute on chronic diastolic heart failure:  ILD; ON [place] oxygen,  She was initially admitted by [facility] and transferred to med service on 4/ 27.  Resume daily lasix for diuresis and symptomatic relief.  She was wheezing on 4/28 and was started on solumedrol 60 mg q12hr and she reports improvement .  Would continue the same.  CXR on 4/29 and 4/30 showed hypo expanded lungs and patchy bilateral air space opacification suggestive for pulmonary edema. Extra doses of lasix can be given as needed.    Compression fracture of the T12 and L1: Pain control and brace to be used as per orthopedics.  She reports the brace is not helpful and it 's worsening her breathing. Would recommend brace to be removed,    Metabolic alkalosis:  nw she is transitioned to comfort care, we will not be checking labs.   Compression fracture of the T12 and L1: Normocytic , stable.    In view of her worsening respiratory status and deconditioning, requested palliative care consult.  Appreciate Dr Lamar Blinks help and transition the patient to comfort care Pain secondary to acute compression fracture- continue Calcitonin for total of 2 weeks, Roxanol helping along with scheduled Tylenol.  Procedures:  NONE  Consultations:  PCCM  PALLIATIVE CARE  Discharge Exam: Filed Vitals:   05/16/15 0549 05/16/15 0702  BP: 84/54 110/48  Pulse: 77   Temp: 98.9 F (37.2 C)   Resp: 18     General: appears comfortable.    Discharge Instructions   Discharge Instructions    Diet - low sodium heart healthy    Complete by:  As directed      Increase activity slowly    Complete by:  As directed           Current Discharge Medication List  START taking these medications   Details  ALPRAZolam (XANAX) 0.5 MG tablet Take 1 tablet (0.5 mg total) by mouth every 4 (four) hours as needed for anxiety. Qty: 30 tablet, Refills: 0    calcitonin, salmon, (MIACALCIN/FORTICAL) 200 UNIT/ACT nasal spray Place 1 spray into alternate nostrils daily. Qty: 3.7 mL, Refills: 12    Morphine Sulfate (MORPHINE CONCENTRATE) 10 MG/0.5ML SOLN concentrated solution Take 0.5 mLs (10 mg total) by mouth every 2 (two) hours as needed for moderate pain, severe pain, anxiety or shortness of breath. Qty: 180 mL, Refills: 0     simethicone (MYLICON) 40 MG/0.6ML drops Take 0.6 mLs (40 mg total) by mouth 4 (four) times daily as needed for flatulence. Qty: 30 mL, Refills: 0    traZODone (DESYREL) 50 MG tablet Take 1 tablet (50 mg total) by mouth at bedtime as needed for sleep.      CONTINUE these medications which have CHANGED   Details  ipratropium-albuterol (DUONEB) 0.5-2.5 (3) MG/3ML SOLN Take 3 mLs by nebulization every 2 (two) hours as needed. Qty: 360 mL, Refills: 12    predniSONE (DELTASONE) 20 MG tablet Take 3 tablets (60 mg total) by mouth daily with breakfast. Qty: 5 tablet, Refills: 0      CONTINUE these medications which have NOT CHANGED   Details  ambrisentan (LETAIRIS) 5 MG tablet Take 1 tablet (5 mg total) by mouth daily. Qty: 30 tablet, Refills: 5    famotidine (PEPCID) 20 MG tablet Take 1 tablet (20 mg total) by mouth at bedtime. Qty: 90 tablet, Refills: 3    fluticasone (FLONASE) 50 MCG/ACT nasal spray Place 2 sprays into both nostrils daily. Qty: 16 g, Refills: 5    furosemide (LASIX) 40 MG tablet Take 1 tablet (40 mg total) by mouth daily. Qty: 30 tablet, Refills: 3    pantoprazole (PROTONIX) 40 MG tablet TAKE 1 TABLET BY MOUTH 30 TO 60 MINUTES BEFORE FIRST MEAL OF THE DAY Qty: 90 tablet, Refills: 2    sildenafil (REVATIO) 20 MG tablet Take 1 tablet (20 mg total) by mouth 3 (three) times daily. Qty: 90 tablet, Refills: 5    SSD 1 % cream Apply 1 application topically daily.    tiZANidine (ZANAFLEX) 2 MG tablet Take 2-4 mg by mouth every 8 (eight) hours as needed for muscle spasms.      STOP taking these medications     aspirin 81 MG tablet      guaiFENesin (MUCINEX) 600 MG 12 hr tablet      HYDROcodone-acetaminophen (NORCO/VICODIN) 5-325 MG tablet      ibuprofen (ADVIL,MOTRIN) 800 MG tablet      potassium chloride (K-DUR) 10 MEQ tablet      Saline (SIMPLY SALINE) 0.9 % AERS      traMADol (ULTRAM) 50 MG tablet        Allergies  Allergen Reactions  . Penicillins  Swelling   Follow-up Information    Follow up with FULP, [name], MD.   Specialty:  Family Medicine   Why:  follow up with hospice MD as needed.    Contact information:   [address] [place] [address] [phone]        The results of significant diagnostics from this hospitalization (including imaging, microbiology, ancillary and laboratory) are listed below for reference.    Significant Diagnostic Studies: Mr Lumbar Spine Wo Contrast  04/23/2015  CLINICAL DATA:  Low back pain, worse in the past month. EXAM: MRI LUMBAR SPINE WITHOUT CONTRAST TECHNIQUE: Multiplanar, multisequence  MR imaging of the lumbar spine was performed. No intravenous contrast was administered. COMPARISON:  Chest CTA 10/20/2014 FINDINGS: For the purposes of this dictation, the lowest well-formed intervertebral disc space is presumed to be L5-S1. There is mild lumbar levoscoliosis with apex at L2-3. There is trace retrolisthesis of L1 on L2, L2 on L3, L3 on L4, and L4 on L5. T12 compression fracture demonstrates approximately 55% height loss centrally and moderate marrow edema. There is also a severe L1 compression fracture with approximately 80% height loss centrally and associated marrow edema. These levels were incompletely imaged on the prior chest CTA, however both of these compression fractures appear to have occurred in the interim. An L2 superior endplate compression fracture demonstrates 45-50% height loss greater to the left without significant marrow edema. There is diffuse lumbar disc desiccation. Disc space narrowing is moderate at L2-3, L4-5, and L5-S1 and mild at L1-2 and L3-4. Conus medullaris is normal in signal and terminates at L1. A 2.3 cm T2 hyperintense lesion in the upper pole of the left kidney likely represents a cyst. L1-2: Mild disc bulging and endplate spurring without significant stenosis. L2-3: Mild disc bulging and endplate spurring asymmetric to the right without significant stenosis.  L3-4: Mild disc bulging and slight facet hypertrophy result in mild left lateral recess stenosis and minimal right neural foraminal narrowing without spinal stenosis. L4-5: Mild disc bulging and endplate spurring asymmetric to the left and mild-to-moderate left facet and ligamentum flavum hypertrophy result in mild left lateral recess and moderate left neural foraminal stenosis. There is a shallow central disc protrusion without spinal stenosis. L5-S1: Minimal disc bulging, endplate spurring, and mild-to-moderate left facet arthrosis result in mild left neural foraminal stenosis without spinal stenosis. IMPRESSION: 1. Moderate T12 and severe L1 compression fractures with associated marrow edema, acute to subacute. 2. Chronic appearing, mild-to-moderate L2 compression fracture. 3. Multilevel lumbar disc and facet degeneration with mild-to-moderate neural foraminal stenosis as above. Electronically Signed   By: Sebastian AcheAllen  Grady M.D.   On: 04/23/2015 16:11   Dg Chest Port 1 View  05/12/2015  CLINICAL DATA:  Shortness of breath. EXAM: PORTABLE CHEST 1 VIEW COMPARISON:  May 10, 2015. FINDINGS: Stable cardiomediastinal silhouette. Slightly increased bilateral diffuse lung opacities are noted most consistent with pulmonary edema or possibly pneumonia. No pneumothorax is noted. Mild left pleural effusion cannot be excluded. Bony thorax is unremarkable. IMPRESSION: Slightly increased bilateral lung opacities are noted concerning for worsening edema or pneumonia. Electronically Signed   By: Lupita RaiderJames  Green Jr, M.D.   On: 05/12/2015 14:47   Dg Chest Port 1 View  05/10/2015  CLINICAL DATA:  Acute onset of dyspnea.  Initial encounter. EXAM: PORTABLE CHEST 1 VIEW COMPARISON:  Chest radiograph from 05/08/2015 FINDINGS: The lungs are hypoexpanded. Patchy bilateral airspace opacification raises concern for pulmonary edema, though pneumonia could have a similar appearance. A small left pleural effusion is noted. No pneumothorax is  seen. The cardiomediastinal silhouette is borderline normal in size. No acute osseous abnormalities are identified. IMPRESSION: Lungs hypoexpanded. Patchy bilateral airspace opacification raises concern for pulmonary edema, though pneumonia could have a similar appearance. Small left pleural effusion noted. Electronically Signed   By: Roanna RaiderJeffery  Chang M.D.   On: 05/10/2015 03:55   Dg Chest Port 1 View  05/08/2015  CLINICAL DATA:  75 year old female with a history of increasing shortness of breath EXAM: PORTABLE CHEST 1 VIEW COMPARISON:  Plain film 11/19/2014, CT 11/15/2014 FINDINGS: Limited plain film given patient positioning and overlying soft tissues of  the chest wall/ abdominal wall. Cardiomediastinal silhouette likely unchanged. No visualized pneumothorax. Mixed interstitial and airspace opacities, improved from the plain film of 11/19/2014. IMPRESSION: Limited plain film, with overall improved aeration from the comparison 11/19/2014. Mixed interstitial and airspace opacities bilaterally represents background of patient's known chronic interstitial disease, with superimposed pneumonitis/ pneumonia difficult to exclude. If there is concern for more sensitive/specific evaluation, a repeat formal PA and lateral chest x-ray may be considered. Signed, Yvone Neu. Loreta Ave, DO [facility] Electronically Signed   By: Gilmer Mor D.O.   On: 05/08/2015 08:43    Microbiology: No results found for this or any previous visit (from the past 240 hour(s)).   Labs: Basic Metabolic Panel:  Recent Labs Lab 05/10/15 0646 05/11/15 0655 05/12/15 0732 05/13/15 0638  NA 139 136 139 137  K 3.5 3.9 3.9 4.2  CL 84* 79* 79* 73*  CO2 45* 49* >50* >50*  GLUCOSE 101* 123* 137* 145*  BUN 22*  CREATININE 0.59 0.49 0.42* 0.37*  CALCIUM 8.4* 8.4* 8.9 8.8*   Liver Function Tests: No results for input(s): AST, ALT, ALKPHOS, BILITOT, PROT, ALBUMIN in the  last 168 hours. No results for input(s): LIPASE, AMYLASE in the last 168 hours. No results for input(s): AMMONIA in the last 168 hours. CBC: No results for input(s): WBC, NEUTROABS, HGB, HCT, MCV, PLT in the last 168 hours. Cardiac Enzymes: No results for input(s): CKTOTAL, CKMB, CKMBINDEX, TROPONINI in the last 168 hours. BNP: BNP (last 3 results)  Recent Labs  10/20/14 1730 11/13/14 1919 05/08/15 0825  BNP 88.5 105.4* 64.9    ProBNP (last 3 results) No results for input(s): PROBNP in the last 8760 hours.  CBG: No results for input(s): GLUCAP in the last 168 hours.     SignedRicharda Overlie MD.  [facility] 05/16/2015, 10:53 AM

## 2015-05-16 NOTE — Progress Notes (Signed)
Palliative Medicine RN Note: Rec'd call from Dr Phillips OdorGolding; pt's daughter no longer feels like she can care for pt at home and want to go to inpt hospice.   PMT RN spoke with daughter and son in law, who are both present. Daughter expressed concern that she will have insufficient family support to care for the pt at home, as she can't get anyone to commit to times to come help. Dr Phillips OdorGolding [facility] based on pt already accepted by [facility]. Family agrees with this plan. Referral made and [facility] rep Stacie aware of change in plans. No beds available today. Of note, during PMT visit, pt had several BMs, stating that she had "two weeks' worth to get out" and that she is feeling better.  PMT RN spoke with pt to update on the plan. Pt is very concerned about cost of care; explained that once she gets to [facility], [facility] will take over; daughter stressed to pt that money is not a concern. Plan for f/u by PMT member tomorrow am to check on pt symptoms and bed availability at [facility].  Donn PieriniMelanie G. Oliver, RN, BSN, [facility] 05/16/2015 3:16 PM Cell [phone] 8:00-4:00 Monday-Friday Office [phone]

## 2015-05-16 NOTE — Progress Notes (Deleted)
Of note, I did evaluate patient yesterday. She looked excellent and was in much better spirits. She feels that her back pain has improved dramatically and that she is much more function. Per patient, she is to be discharged today. My office will call her for a follow-up appt in the office.

## 2015-05-17 ENCOUNTER — Ambulatory Visit: Payer: Medicare Other | Admitting: Internal Medicine

## 2015-05-17 MED ORDER — BISACODYL 10 MG RE SUPP: 10.0000 mg | Freq: Every day | RECTAL | Status: AC | PRN

## 2015-05-17 NOTE — Clinical Social Work Note (Signed)
Patient to be d/c'ed today to [facility].  Patient and family agreeable to plans will transport via ems RN to call report.  Bruin Bolger, MSW, LCSWA [phone]  

## 2015-05-17 NOTE — Progress Notes (Signed)
[facility] [employer]   Received request from [place] for family interest in [facility]. Chart reviewed.  [facility] available today. Met with daughter, Lynelle Smoke to complete paper work for transfer today.  ? Please fax discharge summary to [phone]. ? RN please call report to [phone]. ? Please arrange transport for as early in day as possible.  ? Thank you, Freddi Starr RN, [facility] Liaison [phone]

## 2015-05-17 NOTE — Progress Notes (Signed)
PMT RN: Pt denies pain, dyspnea, n/v. Plan to [facility] today; pt is aware of transfer. Family in room likewise aware. Pt and family have no further questions and verbalize appreciation for care of [facility].  Donn PieriniMelanie G. Oliver, RN, BSN, [facility] 05/17/2015 1:18 PM Cell [phone] 8:00-4:00 Monday-Friday Office [phone]

## 2015-05-17 NOTE — Clinical Social Work Note (Signed)
CS[facility] who said [facility] have [facility] available for today.  [facility] will [facility].  CSW awaiting completion of [facility] with [facility] and patient's family.  Ervin KnackEric R. Estell Puccini, MSW, LCSWA [phone] 05/17/2015 11:42 AM

## 2015-05-17 NOTE — Progress Notes (Signed)
[facility], reported to nurse Marylee FlorasStephanie Nicoles.

## 2015-05-17 NOTE — Progress Notes (Signed)
Pt's family member refused a bath stated in the morning will be better . Pt. Family stated they did not know if he was going to make it. Just to let him rest

## 2015-05-17 NOTE — Discharge Summary (Signed)
Physician Discharge Summary  Miranda Hines ZOX:[id number] DOB: Aug 16, 1940 DOA: 05/08/2015  PCP: Cain SaupeFULP, [name], MD  Admit date: 05/08/2015 Discharge date: 05/16/2015  Time spent: 30 minutes        Recommendations for Outpatient Follow-up:  1. Follow up with hospice MD AS needed 2. Patient is being discharged to a hospice facility, Dr. Phillips OdorGolding  [facility]   Discharge Diagnoses:   Acute on chronic respiratory failure from fluid over load from acute on chronic diastolic heart failure Compression fracture of the T12 and L1   anemia   Diet recommendation: COMFORT FEEDS  Filed Weights   05/13/15 0500 05/14/15 0500 05/15/15 0416  Weight: 97.6 kg (215 lb 2.7 oz) 94.3 kg (207 lb 14.3 oz) 92.8 kg (204 lb 9.4 oz)    History of present illness:  75 yo female with hx of ILD and pulmonary hypertension has progressive shortness of breath. She has been followed by Dr. [name] with orthopedics for T12 and L1 compression fractures. She was placed in a brace that she is to wear during the day. This makes her breathing extremely difficulty, and she feels like she can't take a deep breath in. She also has not been taking her lasix recently because it is too painful for her to walk to go the bathroom. She had prednisone dose increased recently as outpt and this didn't help. She has been given neb tx w/o any difference. She normally wears 3 to 4 liters oxygen at home. now needing  5-10L Nasal Cannula O2  Hospital Course:  Acute on chronic respiratory failure from fluid over load from acute on chronic diastolic heart failure:  ILD; ON [place] oxygen,  [facility] service on 4/ 27.  Resume daily lasix for diuresis and symptomatic relief.  She was wheezing on 4/28 and was started on solumedrol 60 mg q12hr and she reports improvement . Would continue the same.  CXR on 4/29 and 4/30 showed hypo expanded lungs and patchy bilateral air  space opacification suggestive for pulmonary edema. Extra doses of lasix can be given as needed.    Compression fracture of the T12 and L1: Pain control and brace to be used as per orthopedics.  She reports the brace is not helpful and it 's worsening her breathing. Would recommend brace to be removed,    Metabolic alkalosis:  nw she is transitioned to comfort care, we will not be checking labs.   Compression fracture of the T12 and L1: Normocytic , stable.    In view of her worsening respiratory status and deconditioning, requested palliative care consult.  Appreciate Dr Lamar BlinksGolding's help and transition the patient to comfort care Pain secondary to acute compression fracture- continue Calcitonin for total of 2 weeks, Roxanol helping along with scheduled Tylenol.  Procedures:  NONE  Consultations:  PCCM  PALLIATIVE CARE  Discharge Exam: Filed Vitals:   05/16/15 2238 05/17/15 0519  BP: 135/66 121/54  Pulse: 81 81  Temp: 98.9 F (37.2 C) 97 F (36.1 C)  Resp: 19 18    General: appears comfortable.    Discharge Instructions   Discharge Instructions    Diet - low sodium heart healthy    Complete by:  As directed      Diet - low sodium heart healthy    Complete by:  As directed      Increase activity slowly    Complete by:  As directed      Increase activity  slowly    Complete by:  As directed           Current Discharge Medication List    START taking these medications   Details  ALPRAZolam (XANAX) 0.5 MG tablet Take 1 tablet (0.5 mg total) by mouth every 4 (four) hours as needed for anxiety. Qty: 30 tablet, Refills: 0    calcitonin, salmon, (MIACALCIN/FORTICAL) 200 UNIT/ACT nasal spray Place 1 spray into alternate nostrils daily. Qty: 3.7 mL, Refills: 12    Morphine Sulfate (MORPHINE CONCENTRATE) 10 MG/0.5ML SOLN concentrated solution Take 0.5 mLs (10 mg total) by mouth every 2 (two) hours as needed for moderate pain, severe pain, anxiety or shortness  of breath. Qty: 180 mL, Refills: 0    simethicone (MYLICON) 40 MG/0.6ML drops Take 0.6 mLs (40 mg total) by mouth 4 (four) times daily as needed for flatulence. Qty: 30 mL, Refills: 0    traZODone (DESYREL) 50 MG tablet Take 1 tablet (50 mg total) by mouth at bedtime as needed for sleep.      CONTINUE these medications which have CHANGED   Details  ipratropium-albuterol (DUONEB) 0.5-2.5 (3) MG/3ML SOLN Take 3 mLs by nebulization every 2 (two) hours as needed. Qty: 360 mL, Refills: 12    predniSONE (DELTASONE) 20 MG tablet Take 3 tablets (60 mg total) by mouth daily with breakfast. Qty: 5 tablet, Refills: 0      CONTINUE these medications which have NOT CHANGED   Details  ambrisentan (LETAIRIS) 5 MG tablet Take 1 tablet (5 mg total) by mouth daily. Qty: 30 tablet, Refills: 5    famotidine (PEPCID) 20 MG tablet Take 1 tablet (20 mg total) by mouth at bedtime. Qty: 90 tablet, Refills: 3    fluticasone (FLONASE) 50 MCG/ACT nasal spray Place 2 sprays into both nostrils daily. Qty: 16 g, Refills: 5    furosemide (LASIX) 40 MG tablet Take 1 tablet (40 mg total) by mouth daily. Qty: 30 tablet, Refills: 3    pantoprazole (PROTONIX) 40 MG tablet TAKE 1 TABLET BY MOUTH 30 TO 60 MINUTES BEFORE FIRST MEAL OF THE DAY Qty: 90 tablet, Refills: 2    sildenafil (REVATIO) 20 MG tablet Take 1 tablet (20 mg total) by mouth 3 (three) times daily. Qty: 90 tablet, Refills: 5    SSD 1 % cream Apply 1 application topically daily.    tiZANidine (ZANAFLEX) 2 MG tablet Take 2-4 mg by mouth every 8 (eight) hours as needed for muscle spasms.      STOP taking these medications     aspirin 81 MG tablet      guaiFENesin (MUCINEX) 600 MG 12 hr tablet      HYDROcodone-acetaminophen (NORCO/VICODIN) 5-325 MG tablet      ibuprofen (ADVIL,MOTRIN) 800 MG tablet      potassium chloride (K-DUR) 10 MEQ tablet      Saline (SIMPLY SALINE) 0.9 % AERS      traMADol (ULTRAM) 50 MG tablet        Allergies   Allergen Reactions  . Penicillins Swelling   Follow-up Information    Follow up with FULP, [name], MD.   Specialty:  Family Medicine   Why:  follow up with hospice MD as needed.    Contact information:   [address] [place] [address] [phone]        The results of significant diagnostics from this hospitalization (including imaging, microbiology, ancillary and laboratory) are listed below for reference.    Significant Diagnostic Studies: Mr Lumbar  Spine Wo Contrast  04/23/2015  CLINICAL DATA:  Low back pain, worse in the past month. EXAM: MRI LUMBAR SPINE WITHOUT CONTRAST TECHNIQUE: Multiplanar, multisequence MR imaging of the lumbar spine was performed. No intravenous contrast was administered. COMPARISON:  Chest CTA 10/20/2014 FINDINGS: For the purposes of this dictation, the lowest well-formed intervertebral disc space is presumed to be L5-S1. There is mild lumbar levoscoliosis with apex at L2-3. There is trace retrolisthesis of L1 on L2, L2 on L3, L3 on L4, and L4 on L5. T12 compression fracture demonstrates approximately 55% height loss centrally and moderate marrow edema. There is also a severe L1 compression fracture with approximately 80% height loss centrally and associated marrow edema. These levels were incompletely imaged on the prior chest CTA, however both of these compression fractures appear to have occurred in the interim. An L2 superior endplate compression fracture demonstrates 45-50% height loss greater to the left without significant marrow edema. There is diffuse lumbar disc desiccation. Disc space narrowing is moderate at L2-3, L4-5, and L5-S1 and mild at L1-2 and L3-4. Conus medullaris is normal in signal and terminates at L1. A 2.3 cm T2 hyperintense lesion in the upper pole of the left kidney likely represents a cyst. L1-2: Mild disc bulging and endplate spurring without significant stenosis. L2-3: Mild disc bulging and endplate spurring asymmetric to the  right without significant stenosis. L3-4: Mild disc bulging and slight facet hypertrophy result in mild left lateral recess stenosis and minimal right neural foraminal narrowing without spinal stenosis. L4-5: Mild disc bulging and endplate spurring asymmetric to the left and mild-to-moderate left facet and ligamentum flavum hypertrophy result in mild left lateral recess and moderate left neural foraminal stenosis. There is a shallow central disc protrusion without spinal stenosis. L5-S1: Minimal disc bulging, endplate spurring, and mild-to-moderate left facet arthrosis result in mild left neural foraminal stenosis without spinal stenosis. IMPRESSION: 1. Moderate T12 and severe L1 compression fractures with associated marrow edema, acute to subacute. 2. Chronic appearing, mild-to-moderate L2 compression fracture. 3. Multilevel lumbar disc and facet degeneration with mild-to-moderate neural foraminal stenosis as above. Electronically Signed   By: Sebastian Ache M.D.   On: 04/23/2015 16:11   Dg Chest Port 1 View  05/12/2015  CLINICAL DATA:  Shortness of breath. EXAM: PORTABLE CHEST 1 VIEW COMPARISON:  May 10, 2015. FINDINGS: Stable cardiomediastinal silhouette. Slightly increased bilateral diffuse lung opacities are noted most consistent with pulmonary edema or possibly pneumonia. No pneumothorax is noted. Mild left pleural effusion cannot be excluded. Bony thorax is unremarkable. IMPRESSION: Slightly increased bilateral lung opacities are noted concerning for worsening edema or pneumonia. Electronically Signed   By: Lupita Raider, M.D.   On: 05/12/2015 14:47   Dg Chest Port 1 View  05/10/2015  CLINICAL DATA:  Acute onset of dyspnea.  Initial encounter. EXAM: PORTABLE CHEST 1 VIEW COMPARISON:  Chest radiograph from 05/08/2015 FINDINGS: The lungs are hypoexpanded. Patchy bilateral airspace opacification raises concern for pulmonary edema, though pneumonia could have a similar appearance. A small left pleural  effusion is noted. No pneumothorax is seen. The cardiomediastinal silhouette is borderline normal in size. No acute osseous abnormalities are identified. IMPRESSION: Lungs hypoexpanded. Patchy bilateral airspace opacification raises concern for pulmonary edema, though pneumonia could have a similar appearance. Small left pleural effusion noted. Electronically Signed   By: Roanna Raider M.D.   On: 05/10/2015 03:55   Dg Chest Port 1 View  05/08/2015  CLINICAL DATA:  75 year old female with a history of increasing shortness  of breath EXAM: PORTABLE CHEST 1 VIEW COMPARISON:  Plain film 11/19/2014, CT 11/15/2014 FINDINGS: Limited plain film given patient positioning and overlying soft tissues of the chest wall/ abdominal wall. Cardiomediastinal silhouette likely unchanged. No visualized pneumothorax. Mixed interstitial and airspace opacities, improved from the plain film of 11/19/2014. IMPRESSION: Limited plain film, with overall improved aeration from the comparison 11/19/2014. Mixed interstitial and airspace opacities bilaterally represents background of patient's known chronic interstitial disease, with superimposed pneumonitis/ pneumonia difficult to exclude. If there is concern for more sensitive/specific evaluation, a repeat formal PA and lateral chest x-ray may be considered. Signed, Yvone Neu. Loreta Ave, DO [facility] Electronically Signed   By: Gilmer Mor D.O.   On: 05/08/2015 08:43    Microbiology: No results found for this or any previous visit (from the past 240 hour(s)).   Labs: Basic Metabolic Panel:  Recent Labs Lab 05/11/15 0655 05/12/15 0732 05/13/15 0638  NA 136 139 137  K 3.9 3.9 4.2  CL 79* 79* 73*  CO2 49* >50* >50*  GLUCOSE 123* 137* 145*  BUN 13 17 22*  CREATININE 0.49 0.42* 0.37*  CALCIUM 8.4* 8.9 8.8*   Liver Function Tests: No results for input(s): AST, ALT, ALKPHOS, BILITOT, PROT, ALBUMIN in the last 168  hours. No results for input(s): LIPASE, AMYLASE in the last 168 hours. No results for input(s): AMMONIA in the last 168 hours. CBC: No results for input(s): WBC, NEUTROABS, HGB, HCT, MCV, PLT in the last 168 hours. Cardiac Enzymes: No results for input(s): CKTOTAL, CKMB, CKMBINDEX, TROPONINI in the last 168 hours. BNP: BNP (last 3 results)  Recent Labs  10/20/14 1730 11/13/14 1919 05/08/15 0825  BNP 88.5 105.4* 64.9    ProBNP (last 3 results) No results for input(s): PROBNP in the last 8760 hours.  CBG: No results for input(s): GLUCAP in the last 168 hours.     SignedRicharda Overlie MD.  [facility] 05/17/2015, 11:02 AM

## 2015-05-17 NOTE — Care Management Important Message (Signed)
Important Message  Patient Details  Name: Miranda Hines MRN: [id number] Date of Birth: [birth year]   Medicare Important Message Given:  Yes    Orson AloeMegan P Zohar Maroney 05/17/2015, 1:03 PM

## 2015-05-17 NOTE — Progress Notes (Signed)
Correction .Marland Kitchen. Charted this on the wrong room however did perform peri care and foley care on this pt.

## 2015-05-29 ENCOUNTER — Telehealth: Payer: Self-pay | Admitting: Internal Medicine

## 2015-05-29 NOTE — Telephone Encounter (Signed)
[facility] x 1

## 2015-05-29 NOTE — Telephone Encounter (Signed)
Miranda Hines returned call, CB [phone]. Can leave information with ANYONE who answers the call. She states she is not the one who has to take the call.

## 2015-05-29 NOTE — Telephone Encounter (Signed)
Called spoke with Miranda [facility]ines at [facility]. I informed her that Angelique BlonderDenise had called on 05/15/15 and informed Evette that MR would be the attending physician. She voiced understanding and had no further questions. See phone note from 05/15/15. Nothing further needed.

## 2015-06-13 ENCOUNTER — Other Ambulatory Visit: Payer: Self-pay | Admitting: Internal Medicine

## 2015-06-13 NOTE — Telephone Encounter (Signed)
lmtcb x1 for Lillard Bailon. 

## 2015-06-13 NOTE — Telephone Encounter (Signed)
Return call.Stanley A Dalton °

## 2015-06-13 NOTE — Telephone Encounter (Signed)
lmtcb x2 for [place].

## 2015-06-14 MED ORDER — PREDNISONE 20 MG PO TABS: 20.0000 mg | ORAL_TABLET | Freq: Two times a day (BID) | ORAL | Status: AC

## 2015-06-14 NOTE — Telephone Encounter (Signed)
Spoke with [place] from Hospice. States that pt needs a refill on Prednisone. She was on 30mg  daily but then it was increased to 40mg  by [facility]. They need clarification as to what dosage pt should be on.  MW - can you please advise as MR worked 11pm [facility]. Thanks.

## 2015-06-14 NOTE — Telephone Encounter (Signed)
Ok to increase to 40mg daily 

## 2015-06-14 NOTE — Telephone Encounter (Signed)
Miranda Hines has been made aware of this. Rx has been sent in reflecting this. Nothing further was needed.

## 2015-06-14 NOTE — Telephone Encounter (Signed)
lmtcb x1 for pt's daughter, Babette Relicammy. Lillia AbedLindsay instructed us to call her with response.

## 2015-06-14 NOTE — Telephone Encounter (Signed)
Spoke with pt daughter Babette Relicammy, aware of Pred dosing at 40mg  daily.  Tammy states that she spoke with her mother and now the patient is saying that she feels that the 40mg  makes her feel bad when taken all at once.  Pt takes a lot of medications in the mornings -- advised that she can wait a little while and take the Prednisone after all of her other medications so that there is not an overload on her system with all these medications all at once. Daughter wants to know if 40mg  can be split - take 20mg  in the morning and 20mg  later in the day or at night.   Please advise Dr Sherene SiresWert. Thanks.

## 2015-06-14 NOTE — Telephone Encounter (Signed)
Ideally best to do split doses at bfast and supper

## 2015-06-17 ENCOUNTER — Telehealth: Payer: Self-pay | Admitting: Internal Medicine

## 2015-06-17 NOTE — Telephone Encounter (Signed)
Spoke with Renee. States that pt needs a refill on At[employer] Corporationivan. [facility] was [facility]. The original rx was for Ativan 0.5mg  1-2 tabs q4h prn for anxiety, restlessness and nausea.  MR - would you be willing to prescribe this? Thanks.

## 2015-06-17 NOTE — Telephone Encounter (Signed)
[facility], aware of [facility]ill.  [facility] [address].   MR please advise on quantity and refills for Ativan Rx.    Also, Tammy's number (pt's daughter) per Epic chart is [phone]

## 2015-06-17 NOTE — Telephone Encounter (Signed)
Yes, please get me # for Tammy the daughter so I can talk to her. Patient has declined a lot. HEr sister in law works in my daughters school as well

## 2015-06-19 ENCOUNTER — Telehealth: Payer: Self-pay | Admitting: Internal Medicine

## 2015-06-19 MED ORDER — LORAZEPAM 0.5 MG PO TABS: 0.5000 mg | ORAL_TABLET | ORAL | Status: AC | PRN

## 2015-06-19 NOTE — Telephone Encounter (Signed)
Not sure why rx was never sent  [facility] since pt it out of med and she is one of our hospice pt's Ativan 0.5mg  1-2 tabs q4h prn for anxiety, restlessness and nausea. #90 with 5 rf  Spoke with Renee and notified her this  I have also informed Tammy  Nothing further needed

## 2015-06-19 NOTE — Telephone Encounter (Signed)
MR- pt was out of med so I have called it in for her (see 06/19/15 PN) Babette Relicammy and Audie Pintoenne, RN are aware  Tammy's number is listed below for you

## 2015-06-20 ENCOUNTER — Telehealth: Payer: Self-pay | Admitting: Internal Medicine

## 2015-06-20 NOTE — Telephone Encounter (Signed)
Spoke with Nucor Corporationenee. I have given her the verbal order for pt's UA. Nothing further was needed at this time.

## 2015-06-20 NOTE — Telephone Encounter (Signed)
lmtcb x1 for Miranda Hines with Hospice.

## 2015-06-20 NOTE — Telephone Encounter (Signed)
Renee called back -prm

## 2015-06-21 NOTE — Telephone Encounter (Signed)
Caught up with daugher - patient now in home with hospice.Daughter says is a waiting game. Everyone knowspaitent slowly dying

## 2015-06-25 ENCOUNTER — Telehealth: Payer: Self-pay | Admitting: Internal Medicine

## 2015-06-25 MED ORDER — CIPROFLOXACIN HCL 500 MG PO TABS: 500.0000 mg | ORAL_TABLET | Freq: Two times a day (BID) | ORAL | Status: AC

## 2015-06-25 NOTE — Telephone Encounter (Signed)
[phone], Renee cb

## 2015-06-25 NOTE — Telephone Encounter (Signed)
Per SN: Call in Cipro 500 mg # 14 1 po bid  I spoke with Renee and notified that this was done  Tammy, pt's daughter also aware  Nothing further needed

## 2015-06-25 NOTE — Telephone Encounter (Signed)
LMTCB for Miranda Hines 

## 2015-06-25 NOTE — Telephone Encounter (Signed)
Spoke with Luster Landsbergenee, the pt's hospice RN  She states that she received UA and it was pos for bacteria, and they are waiting on culture to come back  She is asking for something to be called in now, b/c the pt is miserable, with dysuria and fatigue  SN- please advise  Pt only allergic to PCN  Thanks!

## 2015-07-05 ENCOUNTER — Telehealth: Payer: Self-pay | Admitting: Internal Medicine

## 2015-07-05 NOTE — Telephone Encounter (Signed)
LMOM TCB x1 for Miranda Hines with [facility] No mention in pt's chart about stopping the Letairis [facility]

## 2015-07-08 NOTE — Telephone Encounter (Signed)
Left message for Miranda Hines with [facility] to call back.

## 2015-07-09 NOTE — Telephone Encounter (Signed)
Called spoke with Tyler Aasoris. I explained to her that there is no mention in the pt's chart of her stopping Letairis. She voiced understanding and had no further questions.

## 2015-07-12 ENCOUNTER — Telehealth: Payer: Self-pay | Admitting: Internal Medicine

## 2015-07-12 NOTE — Telephone Encounter (Signed)
Per chart pt deceased yesterday while under Hospice care.  LMTCB

## 2015-07-13 DEATH — deceased

## 2015-07-15 NOTE — Telephone Encounter (Signed)
The number listed below is incorrect.  Correct number is [phone]. lmtcb for [name]

## 2015-07-17 ENCOUNTER — Telehealth: Payer: Self-pay

## 2015-07-17 NOTE — Telephone Encounter (Signed)
On 0703/2017 I received a death certificate from [employer] (original). The death certificate is for burial. The patient is a patient of Doctor Ramaswamy. The death certificate will be taken to Pulmonary Unit @ [facility] this am for signature. On 07/15/2015 I received the death certificate back from Doctor Ramaswamy. I got the death certificate ready and called the funeral home to let them know the death certificate is ready for pickup.

## 2015-07-18 DIAGNOSIS — Z515 Encounter for palliative care: Secondary | ICD-10-CM | POA: Insufficient documentation

## 2015-07-18 NOTE — Telephone Encounter (Signed)
A message has been left with Tyler Aasoris on her confidential voicemail that the pt is deceased. Nothing further was needed.

## 2016-06-03 IMAGING — DX DG CHEST 2V
2 series · 2 of 2 positions shown · non-contrast
Comparison: 10/06/2013

CLINICAL DATA: Short of breath, 2 days duration. Chronic lung
disease. Low oxygen saturation.

EXAM:
CHEST  2 VIEW

[w chest pa]
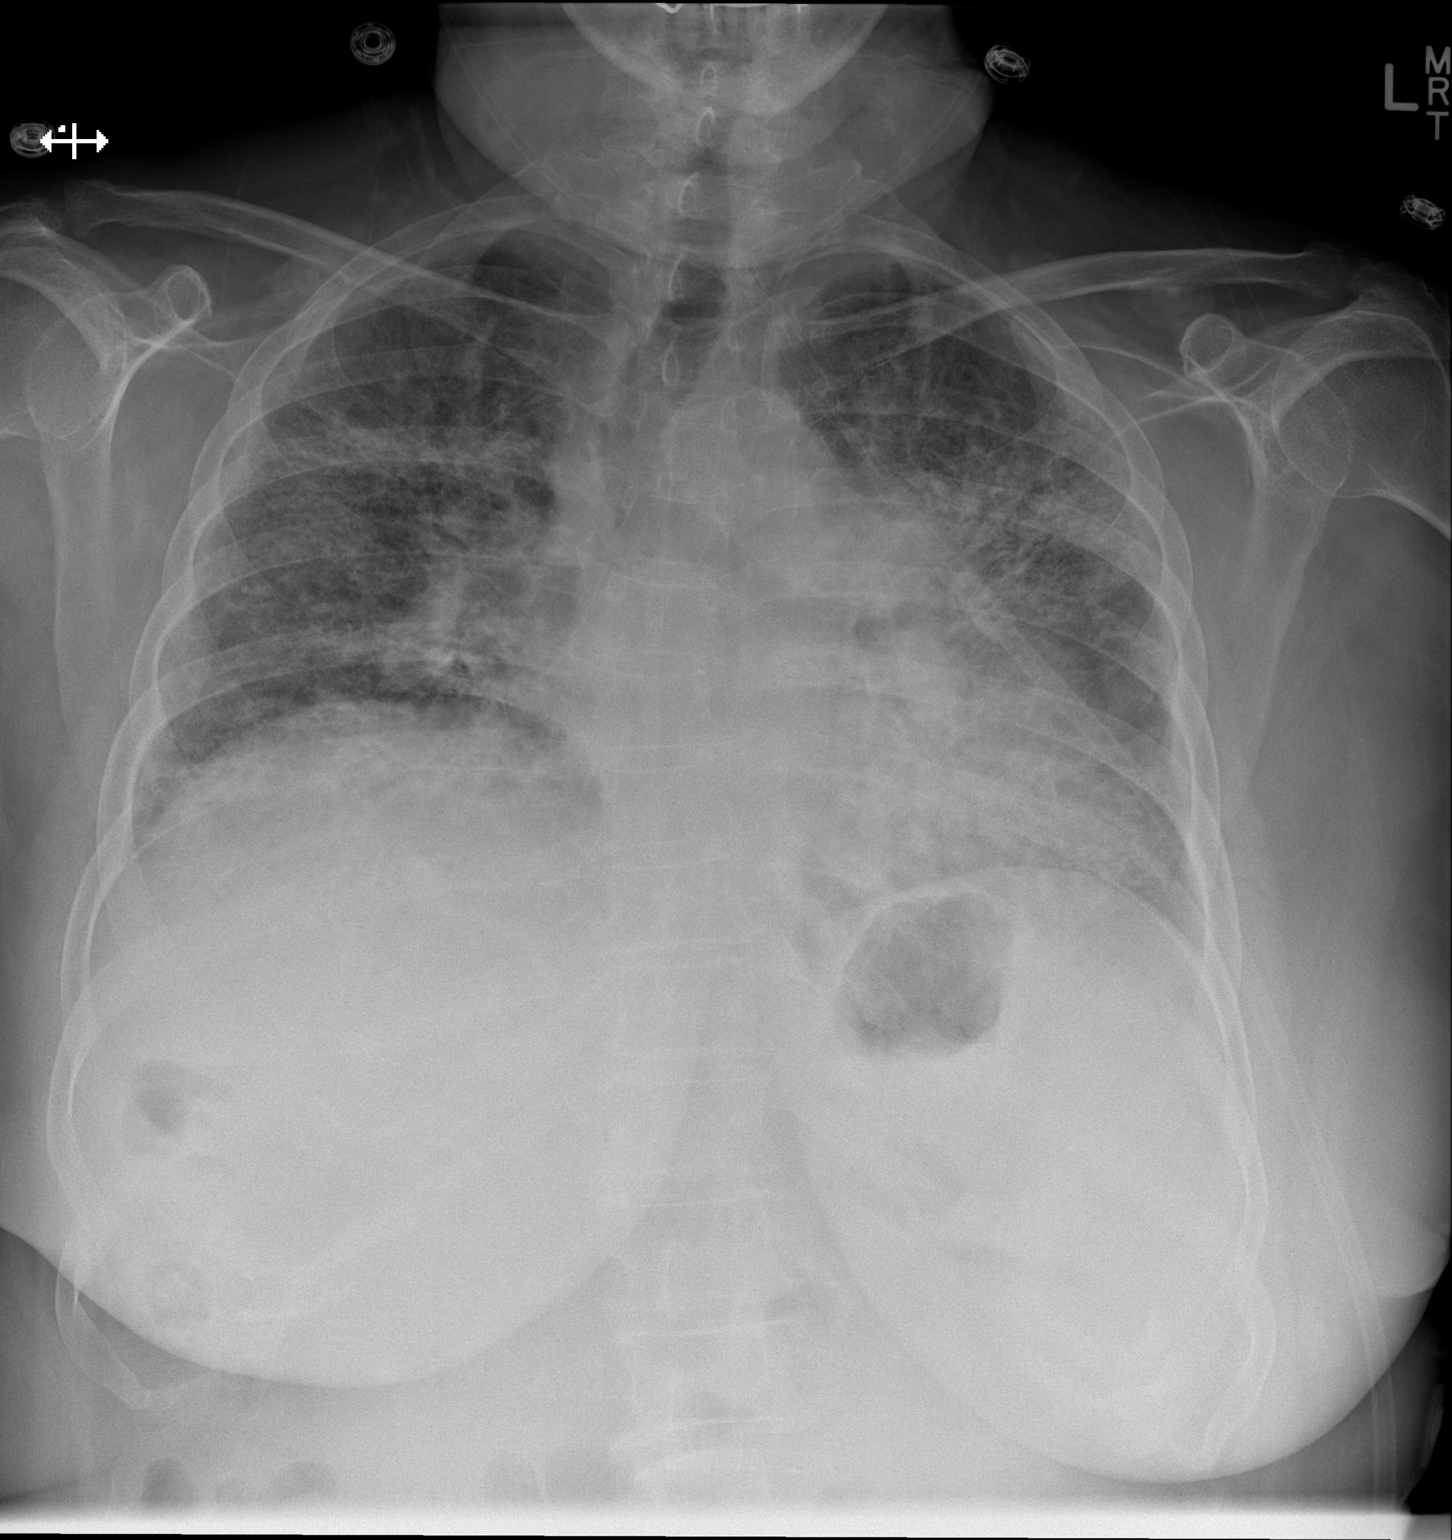

[w chest lat]
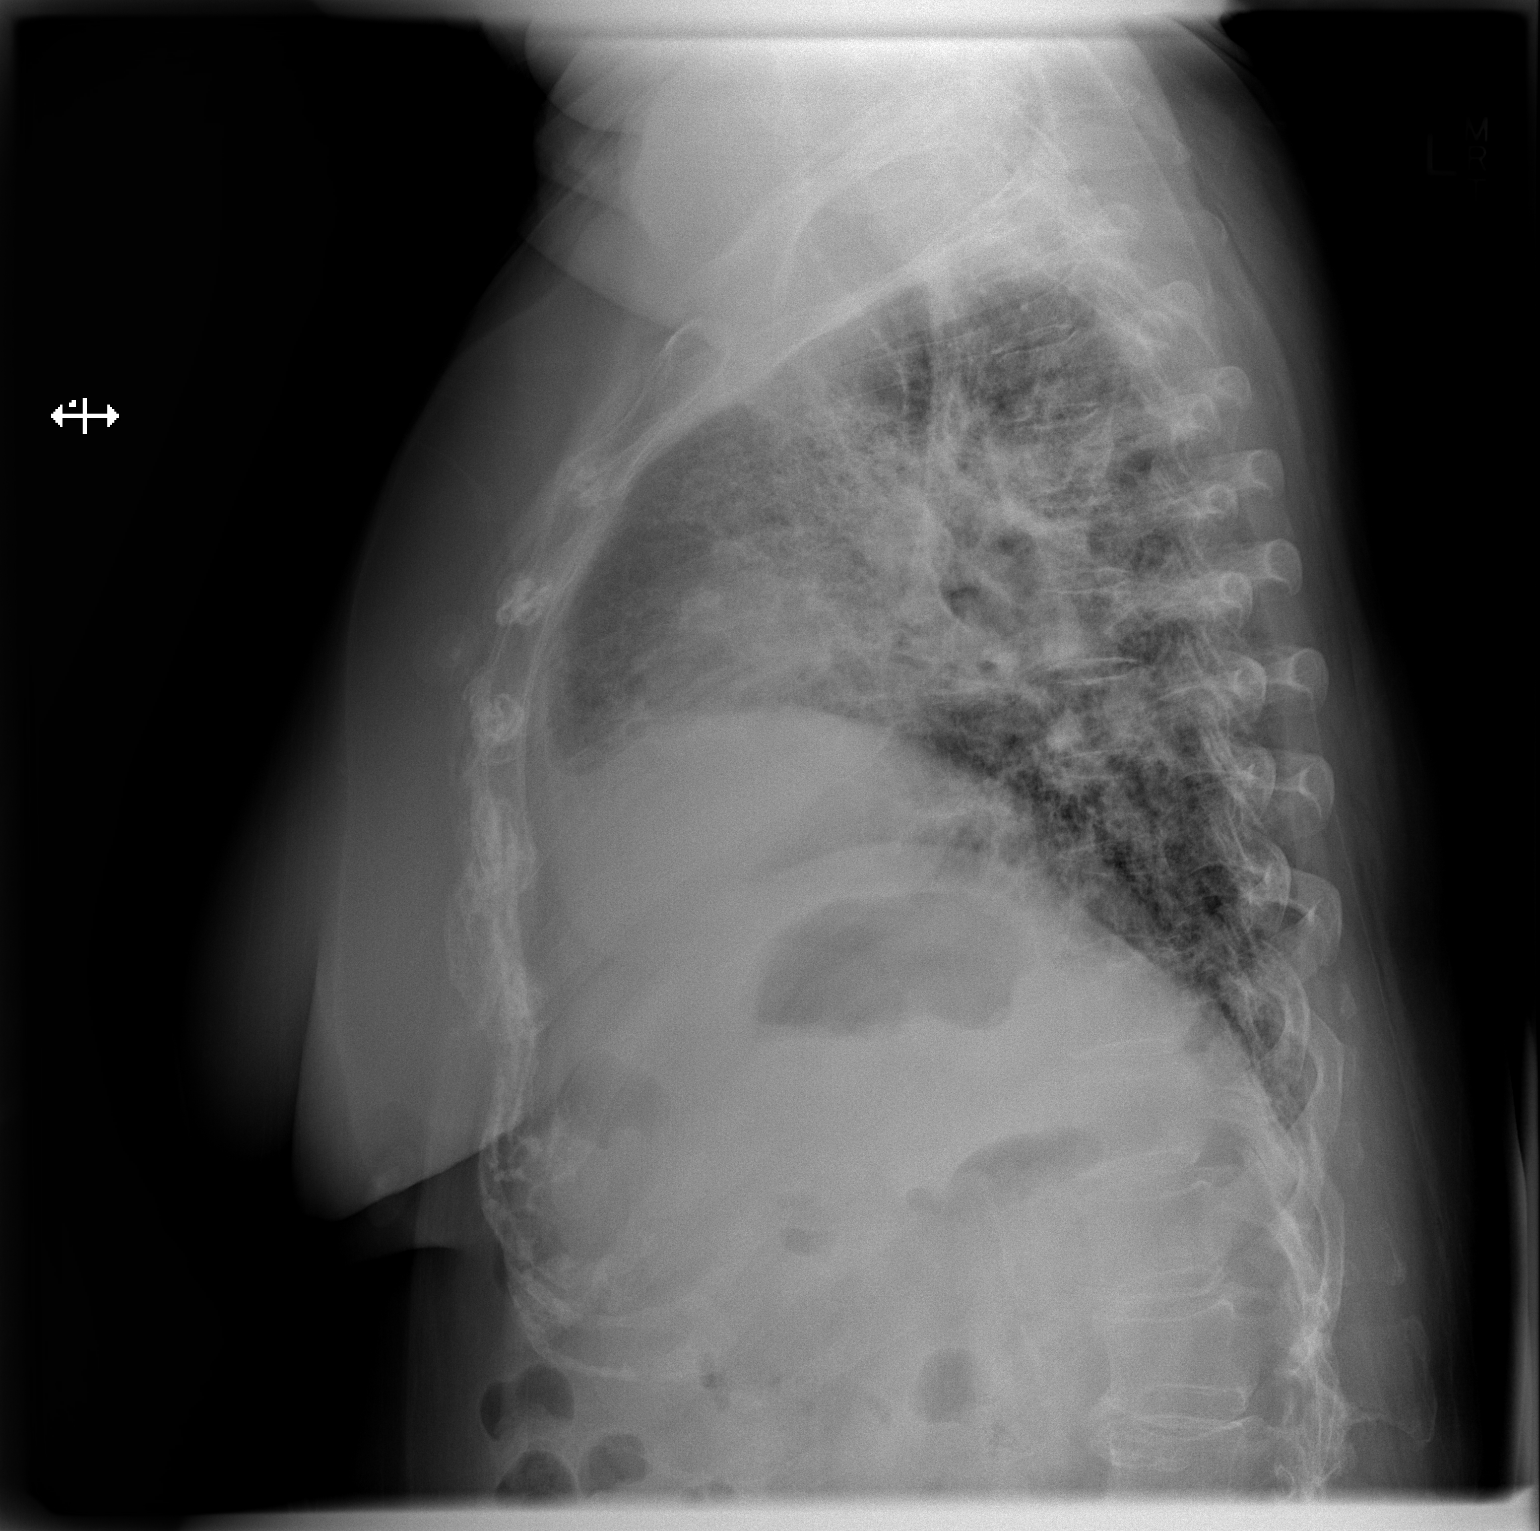

[2 of 2 positions shown; findings below may reference images not displayed]

FINDINGS: Heart size is unchanged. Atherosclerosis of the aorta again seen.
Widespread pulmonary fibrotic pattern is unchanged from the study
10/06/2013. No area of dense consolidation or collapse. No effusion.
Certainly, acute inflammatory change could be hidden amongst the
extensive chronic fibrotic changes.
IMPRESSION: No change. Chronic and advanced widespread pulmonary fibrosis. No
sign of acute infiltrate or collapse. However, due to the extensive
fibrotic changes, acute inflammation could be hidden.

## 2016-06-05 IMAGING — CT CT PARANASAL SINUSES LIMITED
1 series · 11 of 13 positions shown, 14 images · non-contrast
Comparison: None.

CLINICAL DATA: Nasal congestion, shortness of breath due to
pulmonary fibrosis, chronic sinusitis

EXAM:
CT PARANASAL SINUS LIMITED WITHOUT CONTRAST
TECHNIQUE: Non-contiguous multidetector CT images of the paranasal sinuses were
obtained in a single plane without contrast.

[Series 3: limited sinus st · axial · 0.32mm/px · z∈[-106,-6]mm · 11 of 13 slices shown, 14 images]
[im 2/13  brain]
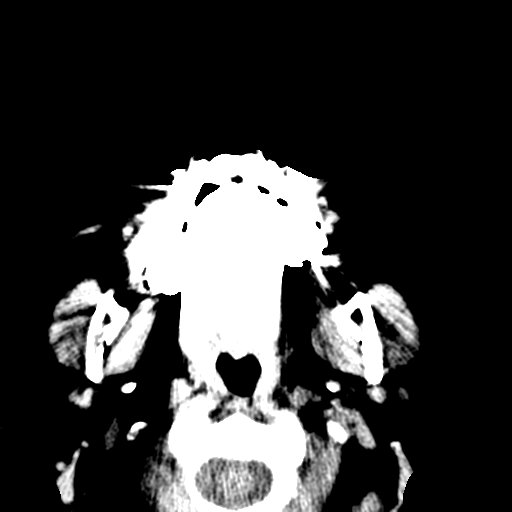
[im 2/13  bone]
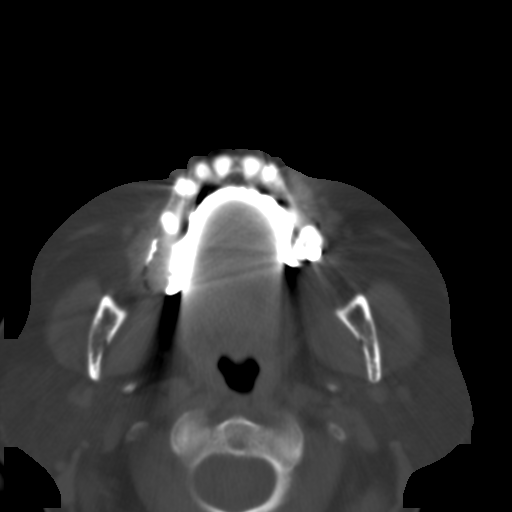
[im 3/13  bone]
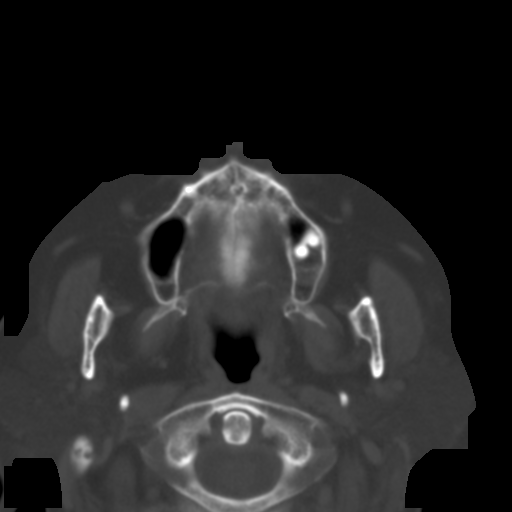
[im 4/13  bone]
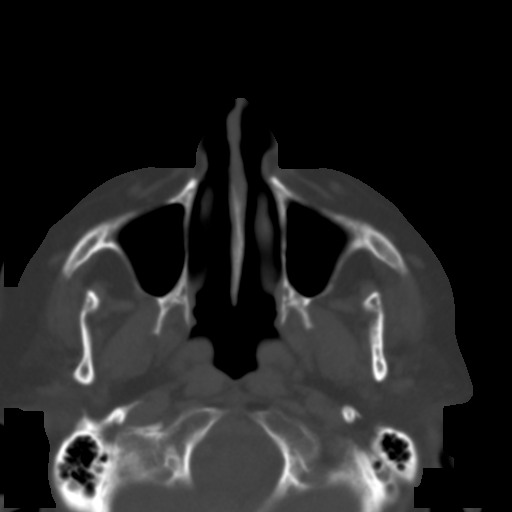
[im 5/13  bone]
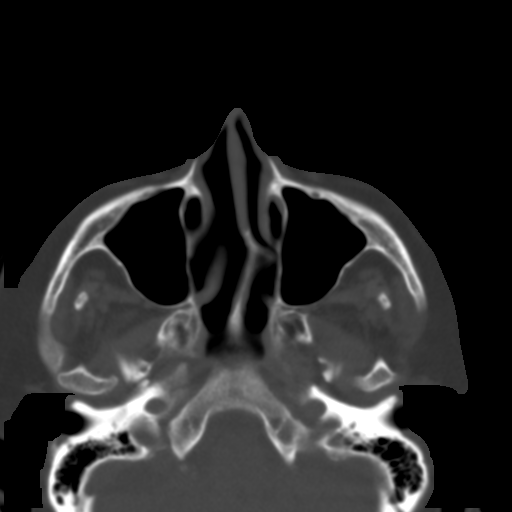
[im 6/13  brain]
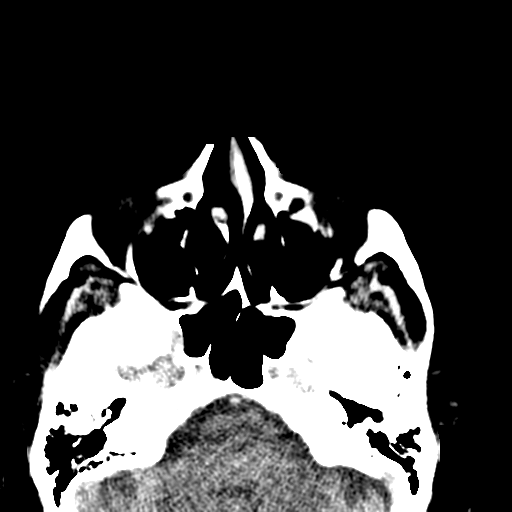
[im 6/13  bone]
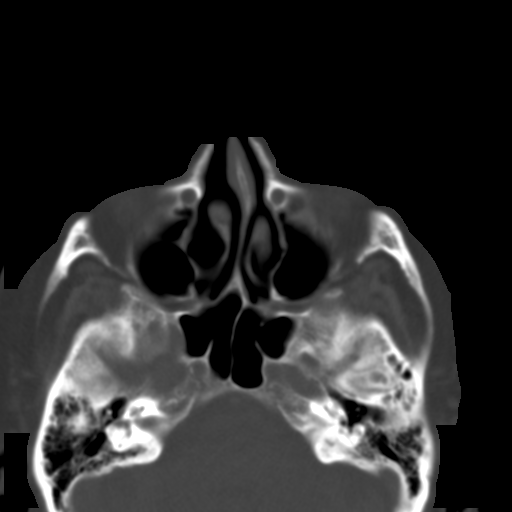
[im 7/13  bone]
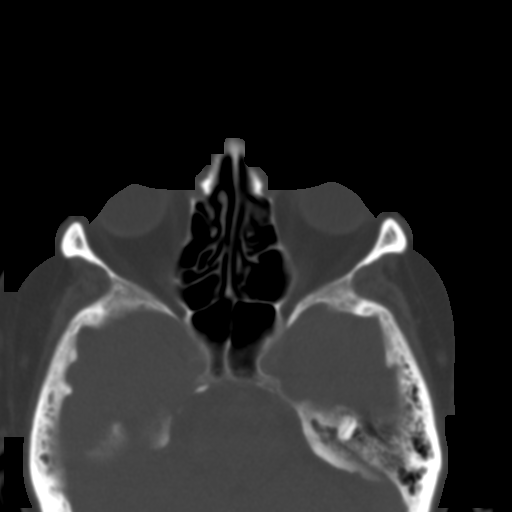
[im 8/13  bone]
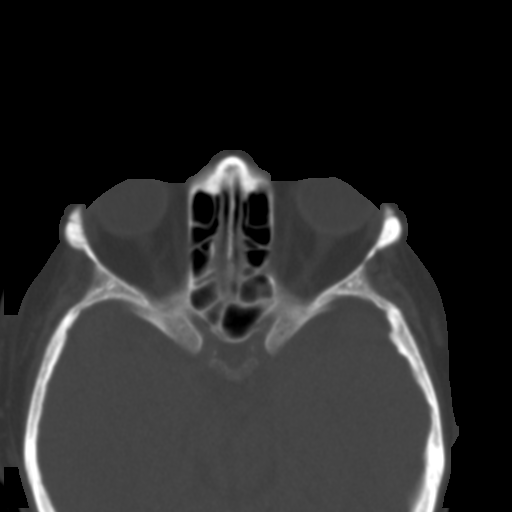
[im 9/13  bone]
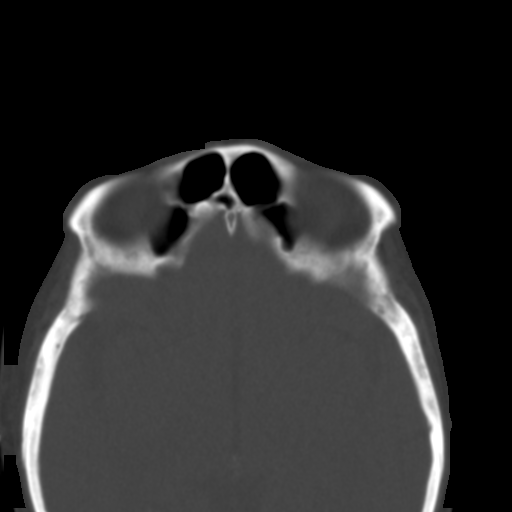
[im 10/13  brain]
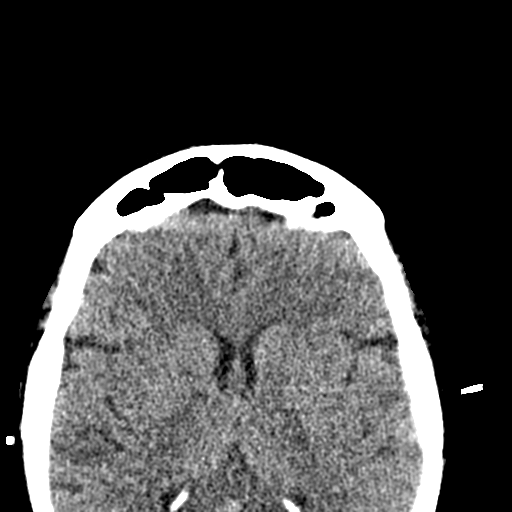
[im 10/13  bone]
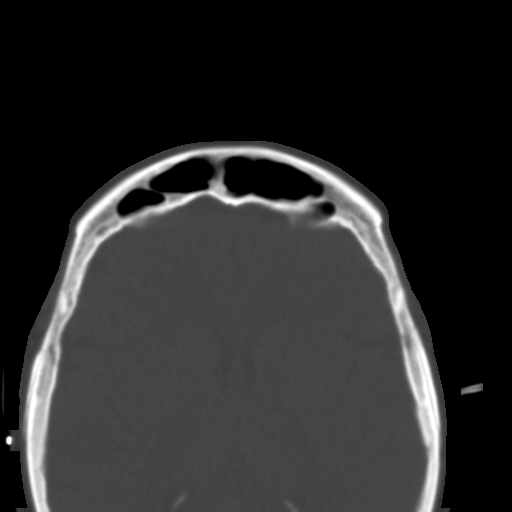
[im 11/13  bone]
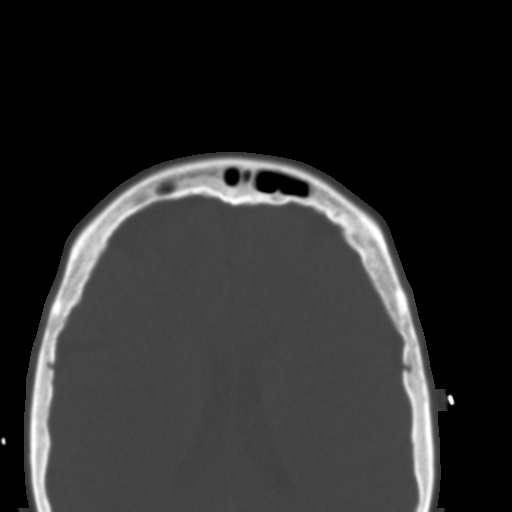
[im 12/13  bone]
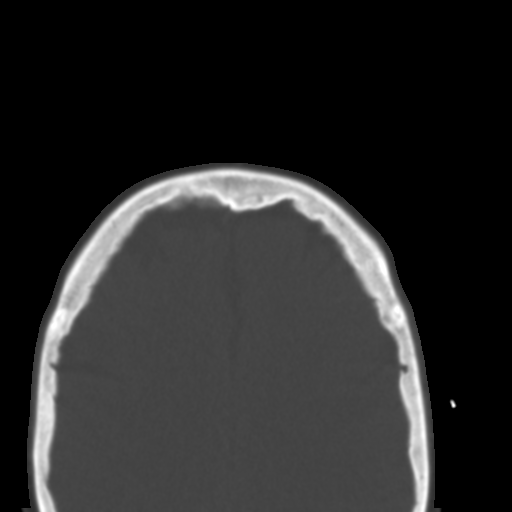

[11 of 13 positions shown; findings below may reference images not displayed]

FINDINGS: Visualized paranasal sinuses are clear.

Bilateral orbits, including the globes and retroconal soft tissues,
are within normal limits.

The bilateral mandibular condyles are well-seated in the TMJs.

Visualized brain parenchyma is unremarkable.
IMPRESSION: Negative limited paranasal sinuses.

## 2016-07-03 IMAGING — DX DG CHEST 1V PORT
1 series · 1 of 1 positions shown · non-contrast
Comparison: Portable chest x-ray November 23, 2014

CLINICAL DATA: Onset of productive cough recently, history of
respiratory failure, idiopathic pulmonary fibrosis, pulmonary
hypertension.

EXAM:
PORTABLE CHEST 1 VIEW

[chest ap]
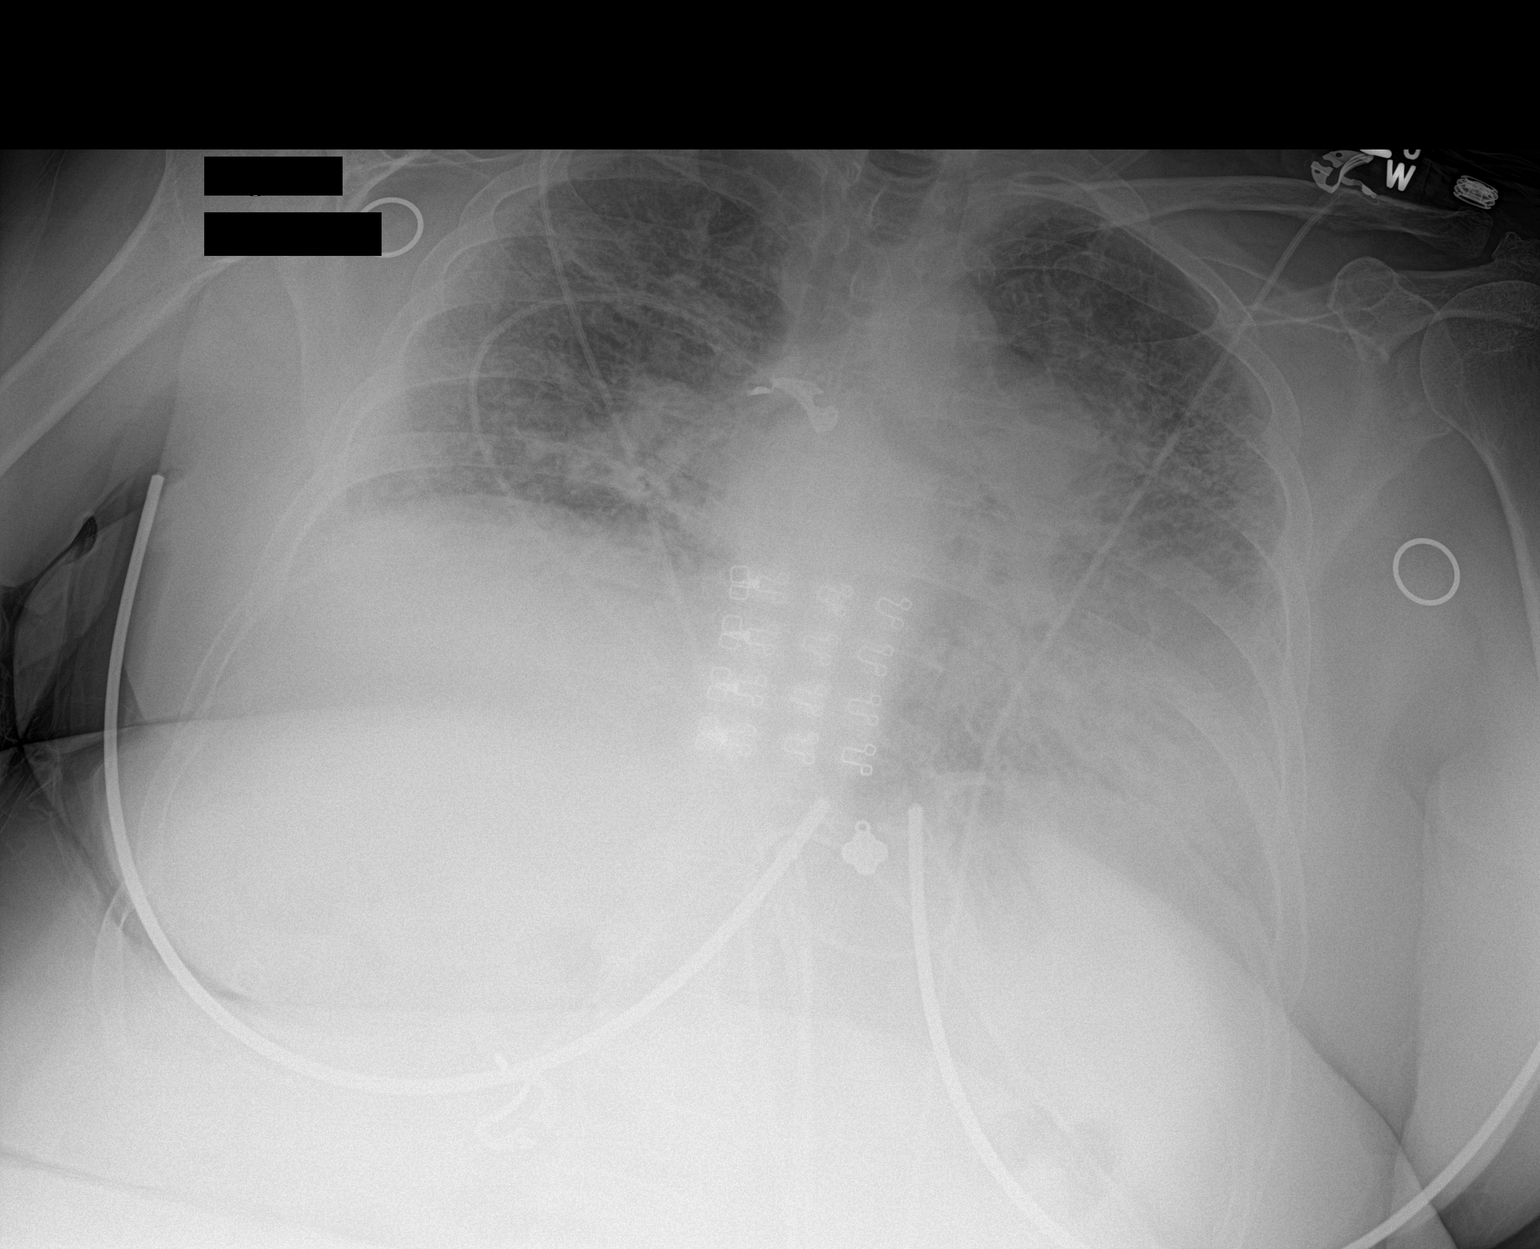

[1 of 1 positions shown; findings below may reference images not displayed]

FINDINGS: The lung volumes remain low. The pulmonary interstitial markings
have increased. The central pulmonary vascularity remains engorged.
The cardiac silhouette remains enlarged with the margins indistinct.
IMPRESSION: Worsening of pulmonary interstitial densities bilaterally most
compatible with acute pulmonary edema superimposed upon known
underlying interstitial lung disease. No alveolar infiltrate is
observed. When the patient can tolerate the procedure, a PA and
lateral chest x-ray would be useful.
